# Patient Record
Sex: Female | Born: 1970
Health system: Southern US, Community
[De-identification: ages and names within clinical notes are randomized; demographics above are authoritative.]

## PROBLEM LIST (undated history)

## (undated) DIAGNOSIS — J40 Bronchitis, not specified as acute or chronic: Secondary | ICD-10-CM

## (undated) DIAGNOSIS — N920 Excessive and frequent menstruation with regular cycle: Secondary | ICD-10-CM

## (undated) HISTORY — PX: TUBAL LIGATION: SHX77

## (undated) HISTORY — DX: Excessive and frequent menstruation with regular cycle: N92.0

---

## 2003-04-30 ENCOUNTER — Encounter: Admission: RE | Admit: 2003-04-30 | Discharge: 2003-04-30 | Payer: Self-pay | Admitting: Family Medicine

## 2003-04-30 ENCOUNTER — Other Ambulatory Visit: Admission: RE | Admit: 2003-04-30 | Discharge: 2003-04-30 | Payer: Self-pay | Admitting: Family Medicine

## 2003-05-02 ENCOUNTER — Encounter: Admission: RE | Admit: 2003-05-02 | Discharge: 2003-05-02 | Payer: Self-pay | Admitting: Family Medicine

## 2003-07-06 ENCOUNTER — Encounter: Admission: RE | Admit: 2003-07-06 | Discharge: 2003-07-06 | Payer: Self-pay | Admitting: Sports Medicine

## 2004-02-26 ENCOUNTER — Ambulatory Visit: Payer: Self-pay | Admitting: Family Medicine

## 2004-02-28 ENCOUNTER — Ambulatory Visit: Payer: Self-pay | Admitting: Family Medicine

## 2004-07-18 ENCOUNTER — Ambulatory Visit: Payer: Self-pay | Admitting: Family Medicine

## 2004-07-30 ENCOUNTER — Ambulatory Visit: Payer: Self-pay | Admitting: Family Medicine

## 2005-01-07 ENCOUNTER — Ambulatory Visit: Payer: Self-pay | Admitting: Family Medicine

## 2005-01-13 ENCOUNTER — Encounter: Admission: RE | Admit: 2005-01-13 | Discharge: 2005-01-13 | Payer: Self-pay | Admitting: Sports Medicine

## 2005-02-10 ENCOUNTER — Ambulatory Visit: Payer: Self-pay | Admitting: Family Medicine

## 2005-03-10 ENCOUNTER — Ambulatory Visit: Payer: Self-pay | Admitting: Family Medicine

## 2005-09-25 ENCOUNTER — Other Ambulatory Visit: Admission: RE | Admit: 2005-09-25 | Discharge: 2005-09-25 | Payer: Self-pay | Admitting: Family Medicine

## 2005-09-25 ENCOUNTER — Ambulatory Visit: Payer: Self-pay | Admitting: Family Medicine

## 2005-09-26 ENCOUNTER — Encounter (INDEPENDENT_AMBULATORY_CARE_PROVIDER_SITE_OTHER): Payer: Self-pay | Admitting: *Deleted

## 2005-09-26 LAB — CONVERTED CEMR LAB

## 2005-09-28 ENCOUNTER — Ambulatory Visit: Payer: Self-pay | Admitting: Family Medicine

## 2006-05-20 DIAGNOSIS — G43909 Migraine, unspecified, not intractable, without status migrainosus: Secondary | ICD-10-CM | POA: Insufficient documentation

## 2006-05-21 ENCOUNTER — Encounter (INDEPENDENT_AMBULATORY_CARE_PROVIDER_SITE_OTHER): Payer: Self-pay | Admitting: *Deleted

## 2007-01-20 ENCOUNTER — Encounter (INDEPENDENT_AMBULATORY_CARE_PROVIDER_SITE_OTHER): Payer: Self-pay | Admitting: Family Medicine

## 2007-01-20 LAB — CONVERTED CEMR LAB

## 2007-01-24 ENCOUNTER — Other Ambulatory Visit: Admission: RE | Admit: 2007-01-24 | Discharge: 2007-01-24 | Payer: Self-pay | Admitting: Family Medicine

## 2007-01-24 ENCOUNTER — Ambulatory Visit: Payer: Self-pay | Admitting: Family Medicine

## 2007-01-24 ENCOUNTER — Encounter (INDEPENDENT_AMBULATORY_CARE_PROVIDER_SITE_OTHER): Payer: Self-pay | Admitting: Family Medicine

## 2007-01-24 DIAGNOSIS — D649 Anemia, unspecified: Secondary | ICD-10-CM | POA: Insufficient documentation

## 2007-01-27 ENCOUNTER — Encounter (INDEPENDENT_AMBULATORY_CARE_PROVIDER_SITE_OTHER): Payer: Self-pay | Admitting: Family Medicine

## 2007-01-27 ENCOUNTER — Ambulatory Visit: Payer: Self-pay | Admitting: Family Medicine

## 2007-01-27 LAB — CONVERTED CEMR LAB
BUN: 16 mg/dL
CO2: 24 meq/L
Calcium: 8.8 mg/dL
Chloride: 106 meq/L
Cholesterol: 171 mg/dL
Creatinine, Ser: 0.85 mg/dL
Glucose, Bld: 95 mg/dL
HCT: 35.1 % — ABNORMAL LOW
HDL: 54 mg/dL
Hemoglobin: 11.1 g/dL — ABNORMAL LOW
LDL Cholesterol: 103 mg/dL — ABNORMAL HIGH
MCHC: 31.6 g/dL
MCV: 96.4 fL
Platelets: 333 K/uL
Potassium: 4.6 meq/L
RBC: 3.64 M/uL — ABNORMAL LOW
RDW: 15.4 % — ABNORMAL HIGH
Sodium: 138 meq/L
Total CHOL/HDL Ratio: 3.2
Triglycerides: 69 mg/dL
VLDL: 14 mg/dL
WBC: 4.7 10*3/microliter

## 2007-01-30 ENCOUNTER — Encounter (INDEPENDENT_AMBULATORY_CARE_PROVIDER_SITE_OTHER): Payer: Self-pay | Admitting: Family Medicine

## 2007-03-01 ENCOUNTER — Ambulatory Visit: Payer: Self-pay | Admitting: Family Medicine

## 2007-03-01 ENCOUNTER — Encounter: Payer: Self-pay | Admitting: Family Medicine

## 2007-03-01 ENCOUNTER — Other Ambulatory Visit: Admission: RE | Admit: 2007-03-01 | Discharge: 2007-03-01 | Payer: Self-pay | Admitting: Family Medicine

## 2007-03-01 DIAGNOSIS — R8789 Other abnormal findings in specimens from female genital organs: Secondary | ICD-10-CM | POA: Insufficient documentation

## 2007-03-08 ENCOUNTER — Encounter: Payer: Self-pay | Admitting: Family Medicine

## 2007-11-12 ENCOUNTER — Emergency Department (HOSPITAL_COMMUNITY): Admission: EM | Admit: 2007-11-12 | Discharge: 2007-11-12 | Payer: Self-pay | Admitting: Emergency Medicine

## 2007-11-12 ENCOUNTER — Encounter (INDEPENDENT_AMBULATORY_CARE_PROVIDER_SITE_OTHER): Payer: Self-pay | Admitting: Internal Medicine

## 2010-03-04 ENCOUNTER — Ambulatory Visit: Payer: Self-pay | Admitting: Family Medicine

## 2010-03-13 ENCOUNTER — Other Ambulatory Visit
Admission: RE | Admit: 2010-03-13 | Discharge: 2010-03-13 | Payer: Self-pay | Source: Home / Self Care | Admitting: Family Medicine

## 2010-03-13 ENCOUNTER — Encounter: Payer: Self-pay | Admitting: Family Medicine

## 2010-03-13 DIAGNOSIS — A5901 Trichomonal vulvovaginitis: Secondary | ICD-10-CM | POA: Insufficient documentation

## 2010-03-13 DIAGNOSIS — N760 Acute vaginitis: Secondary | ICD-10-CM | POA: Insufficient documentation

## 2010-03-13 LAB — CONVERTED CEMR LAB
ALT: 9 units/L (ref 0–35)
AST: 13 units/L (ref 0–37)
Albumin: 3.6 g/dL (ref 3.5–5.2)
Alkaline Phosphatase: 87 units/L (ref 39–117)
BUN: 9 mg/dL (ref 6–23)
CO2: 26 meq/L (ref 19–32)
Calcium: 8.8 mg/dL (ref 8.4–10.5)
Chlamydia, DNA Probe: NEGATIVE
Chloride: 103 meq/L (ref 96–112)
Creatinine, Ser: 0.81 mg/dL (ref 0.40–1.20)
Direct LDL: 100 mg/dL — ABNORMAL HIGH
GC Probe Amp, Genital: NEGATIVE
Glucose, Bld: 86 mg/dL (ref 70–99)
HCT: 28.4 % — ABNORMAL LOW (ref 36.0–46.0)
Hemoglobin: 8.6 g/dL — ABNORMAL LOW (ref 12.0–15.0)
MCHC: 30.3 g/dL (ref 30.0–36.0)
MCV: 86.3 fL (ref 78.0–100.0)
Pap Smear: NEGATIVE
Platelets: 410 10*3/uL — ABNORMAL HIGH (ref 150–400)
Potassium: 3.9 meq/L (ref 3.5–5.3)
RBC: 3.29 M/uL — ABNORMAL LOW (ref 3.87–5.11)
RDW: 17.1 % — ABNORMAL HIGH (ref 11.5–15.5)
Sodium: 138 meq/L (ref 135–145)
Total Bilirubin: 0.1 mg/dL — ABNORMAL LOW (ref 0.3–1.2)
Total Protein: 7.4 g/dL (ref 6.0–8.3)
WBC: 5.7 10*3/uL (ref 4.0–10.5)
Whiff Test: POSITIVE

## 2010-03-14 ENCOUNTER — Encounter: Payer: Self-pay | Admitting: Family Medicine

## 2010-03-18 ENCOUNTER — Ambulatory Visit: Payer: Self-pay

## 2010-04-24 NOTE — Assessment & Plan Note (Signed)
Summary: physical/bmc   Vital Signs:  Patient profile:   40 year old female Height:      64.75 inches Weight:      249 pounds Temp:     99.1 degrees F oral Pulse rate:   62 / minute BP sitting:   126 / 76  (left arm) Cuff size:   large  Vitals Entered By: Loralee Pacas CMA (March 13, 2010 4:15 PM) CC: cpp   Primary Care Provider:  . [facility]  CC:  cpp.  History of Present Illness: 40 yo female here to get physical. Pt is doing relatively well no true concerns except she has hx of anemia and would like it checked again. Pt states at times she seems to get tired with a lot of activity but still can climb 3 flights of stairs without much trouble.  Pt though denies any fever, chills, nausea, vomiting, diarrhea or constipation.  Pt denies any shortness of breath cheat pain or weight changes recently as well.   Pt feels safe at home is taking care of her kids.    Pt is still obese and has not been trying very hard to be on a exercise regimen and at present moment a little too stress to do it.    Pt also states that she has a hard time staying a sleep waking up usually around 3 am and then cannot stay asleep.  Pt states she does not have a sleep regimen or routine and will go to be sometime around 9pm and 1am.  Pt states she may eat 3 hours before she goes to bed or 30 minutes before.  Pt does snore but does not feel her snoring is what wakes her up.    Preventitive:  Pt has never had a mamogram done  Current Medications (verified): 1)  None  Allergies (verified): No Known Drug Allergies  Past History:  Past medical, surgical, family and social histories (including risk factors) reviewed, and no changes noted (except as noted below).  Past Medical History: Reviewed history from 05/20/2006 and no changes required. BTL 1995, C-section-1990, facial dysesthesia--normal MRI 10/06  782.0, H/O Chlamydia 2/05-treated, VBAC x 3  Past Surgical History: Reviewed history from  05/20/2006 and no changes required. MRI brain-Normal, no evidence of MS - 01/21/2005  Family History: Reviewed history from 01/24/2007 and no changes required. Father died at 38 with CHF, maternal aunt -- diabetes, Mother died at 65 with resp. failure, bronchitis, Three sisters-4,41,43; 34 yo with fibromyalgia No FH breast CA  Social History: Reviewed history from 01/24/2007 and no changes required. Unemployed but earned associates degree 5/07 in early childhood development.  Has 4 children ('90, '91, '93, '95).  Lives in an apartment.  No tobacco.  Drinks socially, only one alcoholic drink or so per week.  Pt has monogamous BF since 1/07, not using condoms  Review of Systems       see hpi  Physical Exam  General:  Well-developed,well-nourished,in no acute distress; alert,appropriate and cooperative throughout examination. overweight-appearing.  pleasant female Eyes:  vision grossly intact, pupils equal, pupils round, and pupils reactive to light.   Mouth:  Oral mucosa and oropharynx without lesions or exudates.  Teeth in good repair. Neck:  no lad Lungs:  Normal respiratory effort, chest expands symmetrically. Lungs are clear to auscultation, no crackles or wheezes. Heart:  Normal rate and regular rhythm. S1 and S2 normal without gallop, murmur, click, rub or other extra sounds. Abdomen:  Bowel sounds positive,abdomen soft  and non-tender without masses, organomegaly or hernias noted. obese Genitalia:  Pelvic Exam:        External: normal female genitalia without lesions or masses        Vagina: normal without lesions or masses        Cervix: normal without lesions or masses        Adnexa: normal bimanual exam without masses or fullness        Uterus: normal by palpation        Pap smear: performed Pulses:  2+ Extremities:  No clubbing, cyanosis, edema, or deformity noted with normal full range of motion of all joints.   Neurologic:  CN 2-12 intact   Impression &  Recommendations:  Problem # 1:  OBESITY, UNSPECIFIED (ICD-278.00) will get labs and see if we can motivate her in other ways to lose weight. Orders: Comp Met-[facility] [phone]) Direct LDL-[facility] [phone]) [facility] - Est  40-64 yrs (24401)  Problem # 2:  PHYSICAL EXAMINATION (ICD-V70.0) mamogram ordered today Orders: [facility] - Est  40-64 yrs (02725) Mammogram (Screening) (Mammo)  Problem # 3:  TRICHOMONAL VULVOVAGINITIS (ICD-131.01) will call pt and tell her at later date that she should take a medicine for this and that it is sexually transmitted.  Attempted to call pt but no answer and did not feel it was proper to leave a message.   Other Orders: GC/Chlamydia-[facility] (87591/87491) [facility] [phone]) Pap Smear-[facility] ([id number]) CBC-[facility] [phone])  Patient Instructions: 1)  Good to meet you 2)  I will call you with your labs results 3)  I want to see you again in 6 weeks if you are having sleeping problems still   Orders Added: 1)  GC/Chlamydia-[facility] [87591/87491] 2)  Wet Prep- [facility] [87210] 3)  Pap Smear-[facility] [[id number]] 4)  Comp Met-[facility] [[id number]] 5)  Direct LDL-[facility] [[id number]] 6)  CBC-[facility] [85027] 7)  [facility] - Est  40-64 yrs [99396] 8)  Mammogram (Screening) [Mammo]    Laboratory Results  Date/Time Received: March 13, 2010 4:19 PM  Date/Time Reported: March 13, 2010 5:11 PM   [employer] Source: vag WBC/hpf: >20 Bacteria/hpf: 3+ cocci and few rods Clue cells/hpf: many  Positive whiff Yeast/hpf: none Trichomonas/hpf: none Comments: ...............test performed by......Marland KitchenBonnie A. [place], MLS (ASCP)cm

## 2010-04-24 NOTE — Miscellaneous (Signed)
  Clinical Lists Changes  Medications: Added new medication of METRONIDAZOLE 500 MG TABS (METRONIDAZOLE) 1 tab by mouth two times a day for the next 5 days - Signed Rx of METRONIDAZOLE 500 MG TABS (METRONIDAZOLE) 1 tab by mouth two times a day for the next 5 days;  #10 x 0;  Signed;  Entered by: Antoine Primas DO;  Authorized by: Antoine Primas DO;  Method used: Electronically to [facility], [place], [place]  [id number], Ph: [phone], Fax: [phone]    Prescriptions: METRONIDAZOLE 500 MG TABS (METRONIDAZOLE) 1 tab by mouth two times a day for the next 5 days  #10 x 0   Entered and Authorized by:   Antoine Primas DO   Signed by:   Antoine Primas DO on 03/14/2010   Method used:   Electronically to        [facility] (retail)       [address]       [place], [place]  [id number]       Ph: [phone]       Fax: [phone]   RxID:   [id number]   Appended Document:  called pt told her about in fection she thanked me.

## 2010-04-30 ENCOUNTER — Emergency Department (HOSPITAL_COMMUNITY)
Admission: EM | Admit: 2010-04-30 | Discharge: 2010-04-30 | Disposition: A | Discharge status: Home / Self Care | Payer: Medicaid Other | Attending: Emergency Medicine | Admitting: Emergency Medicine

## 2010-04-30 DIAGNOSIS — R51 Headache: Secondary | ICD-10-CM | POA: Insufficient documentation

## 2010-04-30 LAB — URINALYSIS, ROUTINE W REFLEX MICROSCOPIC
Bilirubin Urine: NEGATIVE
Ketones, ur: 15 mg/dL — AB
Nitrite: NEGATIVE
Protein, ur: NEGATIVE mg/dL
Specific Gravity, Urine: 1.028 (ref 1.005–1.030)
Urine Glucose, Fasting: NEGATIVE mg/dL
Urobilinogen, UA: 1 mg/dL (ref 0.0–1.0)
pH: 6 (ref 5.0–8.0)

## 2010-04-30 LAB — URINE MICROSCOPIC-ADD ON

## 2010-04-30 LAB — POCT PREGNANCY, URINE: Preg Test, Ur: NEGATIVE

## 2010-05-21 ENCOUNTER — Inpatient Hospital Stay (INDEPENDENT_AMBULATORY_CARE_PROVIDER_SITE_OTHER)
Admission: RE | Admit: 2010-05-21 | Discharge: 2010-05-21 | Disposition: A | Discharge status: Home / Self Care | Payer: Medicaid Other | Source: Ambulatory Visit | Attending: Emergency Medicine | Admitting: Emergency Medicine

## 2010-05-21 DIAGNOSIS — L0231 Cutaneous abscess of buttock: Secondary | ICD-10-CM

## 2010-05-21 DIAGNOSIS — N760 Acute vaginitis: Secondary | ICD-10-CM

## 2010-05-21 DIAGNOSIS — L03317 Cellulitis of buttock: Secondary | ICD-10-CM

## 2010-05-21 LAB — WET PREP, GENITAL
Clue Cells Wet Prep HPF POC: NONE SEEN
Trich, Wet Prep: NONE SEEN
Yeast Wet Prep HPF POC: NONE SEEN

## 2010-05-22 LAB — GC/CHLAMYDIA PROBE AMP, GENITAL
Chlamydia, DNA Probe: NEGATIVE
GC Probe Amp, Genital: NEGATIVE

## 2010-06-02 ENCOUNTER — Ambulatory Visit (INDEPENDENT_AMBULATORY_CARE_PROVIDER_SITE_OTHER): Payer: Medicaid Other | Admitting: Family Medicine

## 2010-06-02 ENCOUNTER — Encounter: Payer: Self-pay | Admitting: Family Medicine

## 2010-06-02 VITALS — BP 103/69 | HR 83 | Temp 98.1°F | Ht 64.0 in | Wt 249.0 lb

## 2010-06-02 DIAGNOSIS — Z0000 Encounter for general adult medical examination without abnormal findings: Secondary | ICD-10-CM

## 2010-06-02 LAB — POCT URINALYSIS DIPSTICK
Bilirubin, UA: NEGATIVE
Glucose, UA: NEGATIVE
Ketones, UA: NEGATIVE
Nitrite, UA: NEGATIVE
Protein, UA: NEGATIVE
Spec Grav, UA: >=1.03
Urobilinogen, UA: 0.2
pH, UA: 6

## 2010-06-02 NOTE — Assessment & Plan Note (Signed)
No medical problems except for obesity.  Normal ROS.  Neg protein in urine.  Normal exam.  Certificate given for 2 years.

## 2010-06-02 NOTE — Progress Notes (Signed)
  Subjective:    Patient ID: Joanne Hampton, female    DOB: 07-Aug-1970, 40 y.o.   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  HENT: Negative for ear pain, congestion and neck pain.   Eyes: Negative for pain and visual disturbance.  Respiratory: Negative for chest tightness, shortness of breath and wheezing.   Cardiovascular: Negative for chest pain and leg swelling.  Gastrointestinal: Negative for abdominal distention.  Genitourinary: Negative for difficulty urinating and menstrual problem.  Musculoskeletal: Negative for back pain, joint swelling, arthralgias and gait problem.  Neurological: Negative for dizziness, tremors, seizures, speech difficulty, weakness, light-headedness, numbness and headaches.  Psychiatric/Behavioral: Negative for confusion, decreased concentration and agitation.       Objective:   Physical Exam  Constitutional: She is oriented to person, place, and time. No distress.       Obese  HENT:  Head: Normocephalic and atraumatic.  Right Ear: External ear normal.  Left Ear: External ear normal.  Mouth/Throat: No oropharyngeal exudate.  Eyes: Conjunctivae and EOM are normal. Pupils are equal, round, and reactive to light. No scleral icterus.  Neck: Normal range of motion. Neck supple. No JVD present. No thyromegaly present.  Cardiovascular: Normal rate, regular rhythm and normal heart sounds.   No murmur heard. Pulmonary/Chest: Effort normal and breath sounds normal. No respiratory distress. She has no wheezes. She has no rales. She exhibits no tenderness.  Abdominal: Soft. Bowel sounds are normal. She exhibits no distension. There is no tenderness. There is no rebound and no guarding.  Musculoskeletal:  Normal range of motion. She exhibits no edema and no tenderness.       Normal strength in all extremities  Neurological: She is alert and oriented to person, place, and time. No cranial nerve deficit. She exhibits normal muscle tone. Coordination normal.  Skin: Skin is warm and dry. No rash noted. She is not diaphoretic.  Psychiatric: She has a normal mood and affect. Her behavior is normal. Judgment and thought content normal.          Assessment & Plan:

## 2010-06-04 LAB — URINE CULTURE
Colony Count: NO GROWTH
Organism ID, Bacteria: NO GROWTH

## 2010-06-16 ENCOUNTER — Inpatient Hospital Stay (INDEPENDENT_AMBULATORY_CARE_PROVIDER_SITE_OTHER)
Admission: RE | Admit: 2010-06-16 | Discharge: 2010-06-16 | Disposition: A | Discharge status: Home / Self Care | Payer: Medicaid Other | Source: Ambulatory Visit | Attending: Family Medicine | Admitting: Family Medicine

## 2010-06-16 DIAGNOSIS — N760 Acute vaginitis: Secondary | ICD-10-CM

## 2010-06-16 LAB — POCT URINALYSIS DIP (DEVICE)
Bilirubin Urine: NEGATIVE
Glucose, UA: NEGATIVE mg/dL
Hgb urine dipstick: NEGATIVE
Ketones, ur: NEGATIVE mg/dL
Nitrite: NEGATIVE
Protein, ur: NEGATIVE mg/dL
Specific Gravity, Urine: 1.02 (ref 1.005–1.030)
Urobilinogen, UA: 0.2 mg/dL (ref 0.0–1.0)
pH: 7.5 (ref 5.0–8.0)

## 2010-06-16 LAB — WET PREP, GENITAL
Clue Cells Wet Prep HPF POC: NONE SEEN
Trich, Wet Prep: NONE SEEN
Yeast Wet Prep HPF POC: NONE SEEN

## 2010-06-17 LAB — GC/CHLAMYDIA PROBE AMP, GENITAL
Chlamydia, DNA Probe: NEGATIVE
GC Probe Amp, Genital: NEGATIVE

## 2011-01-21 ENCOUNTER — Other Ambulatory Visit (HOSPITAL_COMMUNITY)
Admission: RE | Admit: 2011-01-21 | Discharge: 2011-01-21 | Disposition: A | Discharge status: Home / Self Care | Payer: Medicaid Other | Source: Ambulatory Visit | Attending: Family Medicine | Admitting: Family Medicine

## 2011-01-21 ENCOUNTER — Ambulatory Visit (INDEPENDENT_AMBULATORY_CARE_PROVIDER_SITE_OTHER): Payer: Medicaid Other | Admitting: Family Medicine

## 2011-01-21 ENCOUNTER — Encounter: Payer: Self-pay | Admitting: Family Medicine

## 2011-01-21 DIAGNOSIS — B9689 Other specified bacterial agents as the cause of diseases classified elsewhere: Secondary | ICD-10-CM

## 2011-01-21 DIAGNOSIS — Z124 Encounter for screening for malignant neoplasm of cervix: Secondary | ICD-10-CM

## 2011-01-21 DIAGNOSIS — Z01419 Encounter for gynecological examination (general) (routine) without abnormal findings: Secondary | ICD-10-CM | POA: Insufficient documentation

## 2011-01-21 DIAGNOSIS — N898 Other specified noninflammatory disorders of vagina: Secondary | ICD-10-CM | POA: Insufficient documentation

## 2011-01-21 DIAGNOSIS — N760 Acute vaginitis: Secondary | ICD-10-CM

## 2011-01-21 DIAGNOSIS — A499 Bacterial infection, unspecified: Secondary | ICD-10-CM

## 2011-01-21 LAB — POCT WET PREP (WET MOUNT)
Trichomonas Wet Prep HPF POC: NEGATIVE
WBC, Wet Prep HPF POC: <5
Yeast Wet Prep HPF POC: NEGATIVE

## 2011-01-21 MED ORDER — METRONIDAZOLE 500 MG PO TABS: 500.0000 mg | ORAL_TABLET | Freq: Two times a day (BID) | ORAL | Status: AC

## 2011-01-21 NOTE — Progress Notes (Signed)
  Subjective:    Patient ID: Joanne Hampton, female    DOB: Apr 09, 1970, 40 y.o.   MRN: [id number]  HPI Patient is having some vaginal irritation. Patient has had this for 45 days. Patient does have a new boyfriend but has been using condoms. Patient does have a history of gonorrhea and Chlamydia in the past. Patient would like to be checked it. When reviewing the chart patient is due for Pap smear Patient denies any fevers chills any abnormal bleeding any abdominal pain out of the ordinary.  Review of Systems As stated in history of present illness     Objective:   Physical Exam General: No apparent distress obese Cardiovascular: Regular in rhythm no murmur Pulmonary: Clear to auscultation bilaterally Abdominal exam: Bowel sounds positive in all 4 quadrants nontender nondistended no masses appreciated no organomegaly Pelvic exam: Vaginal tissues appears to be healthy and pink patient does have scant watery discharge cervix appears normal no bleeding coming for a loss no concerning areas.    Assessment & Plan:

## 2011-01-21 NOTE — Assessment & Plan Note (Signed)
Per her wet prep. We will do Flagyl for the next 5 days if becomes a recurrent issue we'll consider more chronic therapy.

## 2011-01-22 LAB — GC/CHLAMYDIA PROBE AMP, GENITAL
Chlamydia, DNA Probe: NEGATIVE
GC Probe Amp, Genital: NEGATIVE

## 2011-01-26 ENCOUNTER — Telehealth: Payer: Self-pay | Admitting: Family Medicine

## 2011-01-26 NOTE — Telephone Encounter (Signed)
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

## 2011-01-26 NOTE — Telephone Encounter (Signed)
Will see if Dr. Katrinka Blazing is willing to complete the medical certificate card since Dr. Lelon Perla is no long available to do this.  Ileana Ladd

## 2011-01-27 NOTE — Telephone Encounter (Signed)
Message left on voicemail that form is ready to pick up. 

## 2011-01-27 NOTE — Telephone Encounter (Signed)
Filled out return to [employer]

## 2011-09-04 ENCOUNTER — Encounter: Payer: Self-pay | Admitting: Family Medicine

## 2011-09-04 ENCOUNTER — Ambulatory Visit (INDEPENDENT_AMBULATORY_CARE_PROVIDER_SITE_OTHER): Payer: Medicaid Other | Admitting: Family Medicine

## 2011-09-04 VITALS — BP 135/81 | HR 85 | Temp 99.1°F | Ht 62.0 in | Wt 242.3 lb

## 2011-09-04 DIAGNOSIS — J309 Allergic rhinitis, unspecified: Secondary | ICD-10-CM

## 2011-09-04 DIAGNOSIS — J40 Bronchitis, not specified as acute or chronic: Secondary | ICD-10-CM

## 2011-09-04 DIAGNOSIS — J069 Acute upper respiratory infection, unspecified: Secondary | ICD-10-CM | POA: Insufficient documentation

## 2011-09-04 DIAGNOSIS — J302 Other seasonal allergic rhinitis: Secondary | ICD-10-CM

## 2011-09-04 MED ORDER — ALBUTEROL SULFATE (2.5 MG/3ML) 0.083% IN NEBU
2.5000 mg | INHALATION_SOLUTION | Freq: Once | RESPIRATORY_TRACT | Status: AC
  Administered 2011-09-04: 2.5 mg via RESPIRATORY_TRACT

## 2011-09-04 MED ORDER — ALBUTEROL SULFATE HFA 108 (90 BASE) MCG/ACT IN AERS: 2.0000 | INHALATION_SPRAY | Freq: Four times a day (QID) | RESPIRATORY_TRACT | Status: DC | PRN

## 2011-09-04 MED ORDER — FLUTICASONE PROPIONATE 50 MCG/ACT NA SUSP: 2.0000 | Freq: Every day | NASAL | Status: DC

## 2011-09-04 MED ORDER — DEXAMETHASONE SODIUM PHOSPHATE 10 MG/ML IJ SOLN
10.0000 mg | Freq: Once | INTRAMUSCULAR | Status: AC
  Administered 2011-09-04: 10 mg via INTRAMUSCULAR

## 2011-09-04 MED ORDER — IPRATROPIUM BROMIDE 0.02 % IN SOLN
0.5000 mg | Freq: Once | RESPIRATORY_TRACT | Status: AC
  Administered 2011-09-04: 0.5 mg via RESPIRATORY_TRACT

## 2011-09-04 MED ORDER — CETIRIZINE HCL 10 MG PO TABS: 10.0000 mg | ORAL_TABLET | Freq: Every day | ORAL | Status: DC

## 2011-09-04 NOTE — Assessment & Plan Note (Signed)
Patient's start Flonase potentially the postnasal drip is causing worsening of her cough as well. Zyrtec daily for 2 weeks then as needed. Followup as needed.

## 2011-09-04 NOTE — Patient Instructions (Signed)
I sent in 3 medications for you. Think this is your allergies but I think you do have a little bronchitis so we did give you a steroid today 2. You can use the inhaler up to every 4 hours. If you feel like he needed more than that he need to be seen again. Used to no spray in each nostril daily to help with your allergies. If you're not getting better in the next 2-3 weeks please come back for reevaluation. You are due to be seen again for your physical in October.

## 2011-09-04 NOTE — Progress Notes (Signed)
Patient ID: Edythe Riches, female   DOB: 09-02-1970, 41 y.o.   MRN: [id number]  SUBJECTIVE:  Labria Wos is a 41 y.o. female who complains of congestion, sore throat, post nasal drip and dry cough for 14 days. She denies a history of chest pain, chills, fevers, sweats, vomiting and weight loss. She denies a history of asthma. Patient denies smoke cigarettes.   OBJECTIVE: Vitals as noted above. Appearance: alert, well appearing, and in no distress, overweight and well hydrated.  ENT- bilateral TM fluid noted and post nasal drip noted.  Chest - wheezing noted throughout.  ASSESSMENT:  bronchitis  PLAN: Symptomatic therapy suggested: push fluids, rest, gargle warm salt water and return office visit prn if symptoms persist or worsen. Call or return to clinic prn if these symptoms worsen or fail to improve as anticipated.  Bronchitis Patient has symptoms of likely a viral bronchitis. Has lasted 2 weeks and this might be a post viral induced cough. Given an albuterol treatment which did improve symptoms. Patient given inhaler at home given a shot of Decadron today. Flonase for postnasal drip followup as needed. Patient is a red flags and when to seek medical attention.  Allergic rhinitis, seasonal Patient's start Flonase potentially the postnasal drip is causing worsening of her cough as well. Zyrtec daily for 2 weeks then as needed. Followup as needed.

## 2011-09-04 NOTE — Assessment & Plan Note (Signed)
Patient has symptoms of likely a viral bronchitis. Has lasted 2 weeks and this might be a post viral induced cough. Given an albuterol treatment which did improve symptoms. Patient given inhaler at home given a shot of Decadron today. Flonase for postnasal drip followup as needed. Patient is a red flags and when to seek medical attention.

## 2011-09-09 ENCOUNTER — Emergency Department (HOSPITAL_COMMUNITY)
Admission: EM | Admit: 2011-09-09 | Discharge: 2011-09-10 | Disposition: A | Discharge status: Home / Self Care | Payer: Medicaid Other | Attending: Emergency Medicine | Admitting: Emergency Medicine

## 2011-09-09 ENCOUNTER — Encounter (HOSPITAL_COMMUNITY): Payer: Self-pay | Admitting: Emergency Medicine

## 2011-09-09 DIAGNOSIS — H6121 Impacted cerumen, right ear: Secondary | ICD-10-CM

## 2011-09-09 DIAGNOSIS — H6691 Otitis media, unspecified, right ear: Secondary | ICD-10-CM

## 2011-09-09 DIAGNOSIS — H9201 Otalgia, right ear: Secondary | ICD-10-CM

## 2011-09-09 DIAGNOSIS — H6120 Impacted cerumen, unspecified ear: Secondary | ICD-10-CM | POA: Insufficient documentation

## 2011-09-09 DIAGNOSIS — H6690 Otitis media, unspecified, unspecified ear: Secondary | ICD-10-CM | POA: Insufficient documentation

## 2011-09-09 HISTORY — DX: Bronchitis, not specified as acute or chronic: J40

## 2011-09-09 MED ORDER — DOCUSATE SODIUM 50 MG/5ML PO LIQD
200.0000 mg | Freq: Once | ORAL | Status: AC
  Administered 2011-09-10: 200 mg via ORAL
  Filled 2011-09-09: qty 10

## 2011-09-09 NOTE — ED Notes (Signed)
Pt states she was tx with prednisone, breathing tx, inhalers and nasal spray on Friday for bronchitis and the pt states she IS feeling better after taking these medications.  However, today she began feeling like her ear was "stopped up".  As the day progressed, her R ear pain became unbearable to the point where she is tearful and can barely walk d/t the pain.

## 2011-09-09 NOTE — ED Provider Notes (Signed)
History     CSN: [id number]  Arrival date & time 09/09/11  2108   First MD Initiated Contact with Patient 09/09/11 2304      Chief Complaint  Patient presents with  . Otalgia    (Consider location/radiation/quality/duration/timing/severity/associated sxs/prior treatment) Patient is a 41 y.o. female presenting with ear pain. The history is provided by the patient.  Otalgia  She noticed onset this afternoon of fullness in her right ear and that sounds are muffled out of that ear. As the day has gone on, she started having pain in the preauricular area radiating up towards the her temple and down into her neck. Pain is moderate and she rates it 7/10. She denies fever, chills, sweats. Pain is dull. Nothing makes it better nothing makes it worse. She denies trying to clean her ears are scratched inside her ears.  Past Medical History  Diagnosis Date  . Bronchitis     History reviewed. No pertinent past surgical history.  No family history on file.  History  Substance Use Topics  . Smoking status: Never Smoker   . Smokeless tobacco: Not on file  . Alcohol Use: Yes    OB History    Grav Para Term Preterm Abortions TAB SAB Ect Mult Living                  Review of Systems  HENT: Positive for ear pain.   All other systems reviewed and are negative.    Allergies  Review of patient's allergies indicates no known allergies.  Home Medications   Current Outpatient Rx  Name Route Sig Dispense Refill  . ALBUTEROL SULFATE HFA 108 (90 BASE) MCG/ACT IN AERS Inhalation Inhale 2 puffs into the lungs every 6 (six) hours as needed for wheezing. 1 Inhaler 0  . CETIRIZINE HCL 10 MG PO TABS Oral Take 1 tablet (10 mg total) by mouth daily. 30 tablet 11  . FLUTICASONE PROPIONATE 50 MCG/ACT NA SUSP Nasal Place 2 sprays into the nose daily. 16 g 6    BP 169/98  Pulse 82  Temp 99.3 F (37.4 C) (Oral)  Resp 20  SpO2 97%  LMP 08/20/2011  Physical Exam  Nursing note and vitals  reviewed.  41 year old female who is resting comfortably and in no acute distress. Vital signs are significant for hypertension with blood pressure 169/98. Oxygen saturation is 97% which is normal. Head is normocephalic and atraumatic. PERRLA, EOMI. Left tympanic membrane is clear. Right tympanic remain is obscured by cerumen. There is no pericardial lymph node palpable. Oropharynx is clear. Neck is nontender and supple without adenopathy. Lungs are clear without rales, wheezes, rhonchi. Heart has regular rate and rhythm without murmur. Abdomen is soft, flat, nontender without masses or hepatosplenomegaly. Extremities have full range of motion, no cyanosis or edema. Skin is warm and dry without rash. Neurologic: Mental status is normal, cranial nerves are intact, there are no motor or sensory deficits.   ED Course  Procedures (including critical care time)    1. Otalgia of right ear   2. Impacted cerumen of right ear   3. Otitis media of right ear       MDM  Cerumen impaction of the right ear.-year-old be irrigated and she will be reevaluated.  After cerumen removal, he felt a little better but pain had not changed and she still has slight decreased hearing. On reexamination of the external auditory canal, moderate erythema is seen of the tympanic membrane. She'll be treated for otitis  media and given a prescription for amoxicillin and a prescription for Percocet for pain. She is to followup with her PCP.     Dione Booze, MD 09/10/11 [phone]

## 2011-09-09 NOTE — ED Notes (Signed)
PT. REPORTS RIGHT EAR ACHE " CLOGGED UP" WITH NO DRAINAGE ONSET THIS AFTERNOON , DENIES INJURY.

## 2011-09-10 MED ORDER — OXYCODONE-ACETAMINOPHEN 5-325 MG PO TABS
1.0000 | ORAL_TABLET | Freq: Once | ORAL | Status: AC
  Administered 2011-09-10: 1 via ORAL
  Filled 2011-09-10: qty 1

## 2011-09-10 MED ORDER — OXYCODONE-ACETAMINOPHEN 5-325 MG PO TABS: 1.0000 | ORAL_TABLET | ORAL | Status: AC | PRN

## 2011-09-10 MED ORDER — AMOXICILLIN 500 MG PO CAPS: 1000.0000 mg | ORAL_CAPSULE | Freq: Two times a day (BID) | ORAL | Status: AC

## 2011-09-10 MED ORDER — AMOXICILLIN 500 MG PO CAPS
1000.0000 mg | ORAL_CAPSULE | Freq: Once | ORAL | Status: AC
Start: 2011-09-10 — End: 2011-09-10
  Administered 2011-09-10: 1000 mg via ORAL
  Filled 2011-09-10: qty 2

## 2011-09-10 NOTE — ED Notes (Signed)
Pt. D/c home. Family member will drive. A.O. X 4. NAD. Ambulatory.

## 2011-09-10 NOTE — ED Notes (Signed)
Pt.'s right and left ear's irrigated. Tolerated well. Impaction in right ear removed with Colace and warm water/hydrogen peroxide mixture.  Outer wax removed from Left ear.

## 2011-09-10 NOTE — Discharge Instructions (Signed)
Otitis Media, Adult A middle ear infection is an infection in the space behind the eardrum. The medical name for this is "otitis media." It may happen after a common cold. It is caused by a germ that starts growing in that space. You may feel swollen glands in your neck on the side of the ear infection. HOME CARE INSTRUCTIONS   Take your medicine as directed until it is gone, even if you feel better after the first few days.   Only take over-the-counter or prescription medicines for pain, discomfort, or fever as directed by your caregiver.   Occasional use of a nasal decongestant a couple times per day may help with discomfort and help the eustachian tube to drain better.  Follow up with your caregiver in 10 to 14 days or as directed, to be certain that the infection has cleared. Not keeping the appointment could result in a chronic or permanent injury, pain, hearing loss and disability. If there is any problem keeping the appointment, you must call back to this facility for assistance. SEEK IMMEDIATE MEDICAL CARE IF:   You are not getting better in 2 to 3 days.   You have pain that is not controlled with medication.   You feel worse instead of better.   You cannot use the medication as directed.   You develop swelling, redness or pain around the ear or stiffness in your neck.  MAKE SURE YOU:   Understand these instructions.   Will watch your condition.   Will get help right away if you are not doing well or get worse.  Document Released: 12/13/2003 Document Revised: 02/26/2011 Document Reviewed: 10/14/2007 [facility] Patient Information 2012 [place], [place].  Cerumen Impaction A cerumen impaction is when the wax in your ear forms a plug. This plug usually causes reduced hearing. Sometimes it also causes an earache or dizziness. Removing a cerumen impaction can be difficult and painful. The wax sticks to the ear canal. The canal is sensitive and bleeds easily. If you try to remove a  heavy wax buildup with a cotton tipped swab, you may push it in further. Irrigation with water, suction, and small ear curettes may be used to clear out the wax. If the impaction is fixed to the skin in the ear canal, ear drops may be needed for a few days to loosen the wax. People who build up a lot of wax frequently can use ear wax removal products available in your local drugstore. SEEK MEDICAL CARE IF:  You develop an earache, increased hearing loss, or marked dizziness. Document Released: 04/16/2004 Document Revised: 02/26/2011 Document Reviewed: 06/06/2009 [facility] Patient Information 2012 [place], [place].  Amoxicillin capsules or tablets What is this medicine? AMOXICILLIN (a mox i SIL in) is a penicillin antibiotic. It is used to treat certain kinds of bacterial infections. It will not work for colds, flu, or other viral infections. This medicine may be used for other purposes; ask your health care provider or pharmacist if you have questions. What should I tell my health care provider before I take this medicine? They need to know if you have any of these conditions: -asthma -kidney disease -an unusual or allergic reaction to amoxicillin, other penicillins, cephalosporin antibiotics, other medicines, foods, dyes, or preservatives -pregnant or trying to get pregnant -breast-feeding How should I use this medicine? Take this medicine by mouth with a glass of water. Follow the directions on your prescription label. You may take this medicine with food or on an empty stomach. Take  your medicine at regular intervals. Do not take your medicine more often than directed. Take all of your medicine as directed even if you think your are better. Do not skip doses or stop your medicine early. Talk to your pediatrician regarding the use of this medicine in children. While this drug may be prescribed for selected conditions, precautions do apply. Overdosage: If you think you have taken too much of this  medicine contact a poison control center or emergency room at once. NOTE: This medicine is only for you. Do not share this medicine with others. What if I miss a dose? If you miss a dose, take it as soon as you can. If it is almost time for your next dose, take only that dose. Do not take double or extra doses. What may interact with this medicine? -amiloride -birth control pills -chloramphenicol -macrolides -probenecid -sulfonamides -tetracyclines This list may not describe all possible interactions. Give your health care provider a list of all the medicines, herbs, non-prescription drugs, or dietary supplements you use. Also tell them if you smoke, drink alcohol, or use illegal drugs. Some items may interact with your medicine. What should I watch for while using this medicine? Tell your doctor or health care professional if your symptoms do not improve in 2 or 3 days. Take all of the doses of your medicine as directed. Do not skip doses or stop your medicine early. If you are diabetic, you may get a false positive result for sugar in your urine with certain brands of urine tests. Check with your doctor. Do not treat diarrhea with over-the-counter products. Contact your doctor if you have diarrhea that lasts more than 2 days or if the diarrhea is severe and watery. What side effects may I notice from receiving this medicine? Side effects that you should report to your doctor or health care professional as soon as possible: -allergic reactions like skin rash, itching or hives, swelling of the face, lips, or tongue -breathing problems -dark urine -redness, blistering, peeling or loosening of the skin, including inside the mouth -seizures -severe or watery diarrhea -trouble passing urine or change in the amount of urine -unusual bleeding or bruising -unusually weak or tired -yellowing of the eyes or skin Side effects that usually do not require medical attention (report to your doctor or  health care professional if they continue or are bothersome): -dizziness -headache -stomach upset -trouble sleeping This list may not describe all possible side effects. Call your doctor for medical advice about side effects. You may report side effects to FDA at 1-800-FDA-1088. Where should I keep my medicine? Keep out of the reach of children. Store between 68 and 77 degrees F (20 and 25 degrees C). Keep bottle closed tightly. Throw away any unused medicine after the expiration date. NOTE: This sheet is a summary. It may not cover all possible information. If you have questions about this medicine, talk to your doctor, pharmacist, or health care provider.  2012, [employer] Standard. (05/31/2007 2:10:59 PM)  Acetaminophen; Oxycodone tablets What is this medicine? ACETAMINOPHEN; OXYCODONE (a set a MEE noe fen; ox i KOE done) is a pain reliever. It is used to treat mild to moderate pain. This medicine may be used for other purposes; ask your health care provider or pharmacist if you have questions. What should I tell my health care provider before I take this medicine? They need to know if you have any of these conditions: -brain tumor -Crohn's disease, inflammatory bowel disease, or ulcerative  colitis -drink more than 3 alcohol containing drinks per day -drug abuse or addiction -head injury -heart or circulation problems -kidney disease or problems going to the bathroom -liver disease -lung disease, asthma, or breathing problems -an unusual or allergic reaction to acetaminophen, oxycodone, other opioid analgesics, other medicines, foods, dyes, or preservatives -pregnant or trying to get pregnant -breast-feeding How should I use this medicine? Take this medicine by mouth with a full glass of water. Follow the directions on the prescription label. Take your medicine at regular intervals. Do not take your medicine more often than directed. Talk to your pediatrician regarding the use  of this medicine in children. Special care may be needed. Patients over 30 years old may have a stronger reaction and need a smaller dose. Overdosage: If you think you have taken too much of this medicine contact a poison control center or emergency room at once. NOTE: This medicine is only for you. Do not share this medicine with others. What if I miss a dose? If you miss a dose, take it as soon as you can. If it is almost time for your next dose, take only that dose. Do not take double or extra doses. What may interact with this medicine? -alcohol or medicines that contain alcohol -antihistamines -barbiturates like amobarbital, butalbital, butabarbital, methohexital, pentobarbital, phenobarbital, thiopental, and secobarbital -benztropine -drugs for bladder problems like solifenacin, trospium, oxybutynin, tolterodine, hyoscyamine, and methscopolamine -drugs for breathing problems like ipratropium and tiotropium -drugs for certain stomach or intestine problems like propantheline, homatropine methylbromide, glycopyrrolate, atropine, belladonna, and dicyclomine -general anesthetics like etomidate, ketamine, nitrous oxide, propofol, desflurane, enflurane, halothane, isoflurane, and sevoflurane -medicines for depression, anxiety, or psychotic disturbances -medicines for pain like codeine, morphine, pentazocine, buprenorphine, butorphanol, nalbuphine, tramadol, and propoxyphene -medicines for sleep -muscle relaxants -naltrexone -phenothiazines like perphenazine, thioridazine, chlorpromazine, mesoridazine, fluphenazine, prochlorperazine, promazine, and trifluoperazine -scopolamine -trihexyphenidyl This list may not describe all possible interactions. Give your health care provider a list of all the medicines, herbs, non-prescription drugs, or dietary supplements you use. Also tell them if you smoke, drink alcohol, or use illegal drugs. Some items may interact with your medicine. What should I watch  for while using this medicine? Tell your doctor or health care professional if your pain does not go away, if it gets worse, or if you have new or a different type of pain. You may develop tolerance to the medicine. Tolerance means that you will need a higher dose of the medication for pain relief. Tolerance is normal and is expected if you take this medicine for a long time. Do not suddenly stop taking your medicine because you may develop a severe reaction. Your body becomes used to the medicine. This does NOT mean you are addicted. Addiction is a behavior related to getting and using a drug for a nonmedical reason. If you have pain, you have a medical reason to take pain medicine. Your doctor will tell you how much medicine to take. If your doctor wants you to stop the medicine, the dose will be slowly lowered over time to avoid any side effects. You may get drowsy or dizzy. Do not drive, use machinery, or do anything that needs mental alertness until you know how this medicine affects you. Do not stand or sit up quickly, especially if you are an older patient. This reduces the risk of dizzy or fainting spells. Alcohol may interfere with the effect of this medicine. Avoid alcoholic drinks. The medicine will cause constipation. Try to have a  bowel movement at least every 2 to 3 days. If you do not have a bowel movement for 3 days, call your doctor or health care professional. Do not take Tylenol (acetaminophen) or medicines that have acetaminophen with this medicine. Too much acetaminophen can be very dangerous. Many nonprescription medicines contain acetaminophen. Always read the labels carefully to avoid taking more acetaminophen. What side effects may I notice from receiving this medicine? Side effects that you should report to your doctor or health care professional as soon as possible: -allergic reactions like skin rash, itching or hives, swelling of the face, lips, or tongue -breathing difficulties,  wheezing -confusion -light headedness or fainting spells -severe stomach pain -yellowing of the skin or the whites of the eyes Side effects that usually do not require medical attention (report to your doctor or health care professional if they continue or are bothersome): -dizziness -drowsiness -nausea -vomiting This list may not describe all possible side effects. Call your doctor for medical advice about side effects. You may report side effects to FDA at 1-800-FDA-1088. Where should I keep my medicine? Keep out of the reach of children. This medicine can be abused. Keep your medicine in a safe place to protect it from theft. Do not share this medicine with anyone. Selling or giving away this medicine is dangerous and against the law. Store at room temperature between 20 and 25 degrees C (68 and 77 degrees F). Keep container tightly closed. Protect from light. Flush any unused medicines down the toilet. Do not use the medicine after the expiration date. NOTE: This sheet is a summary. It may not cover all possible information. If you have questions about this medicine, talk to your doctor, pharmacist, or health care provider.  2012, [employer]vier/[employer] Standard. (02/06/2008 10:01:21 AM)

## 2012-03-11 ENCOUNTER — Other Ambulatory Visit: Payer: Self-pay | Admitting: Family Medicine

## 2012-03-11 NOTE — Telephone Encounter (Signed)
Refused request for albuterol. Patient not a known asthmatic and was given for bronchitis at visit approximately 6 months ago. She will need to be seen as instructed at that visit and as noted in AVS if she is to get further refills.

## 2012-03-25 ENCOUNTER — Ambulatory Visit (INDEPENDENT_AMBULATORY_CARE_PROVIDER_SITE_OTHER): Payer: Medicaid Other | Admitting: Family Medicine

## 2012-03-25 ENCOUNTER — Encounter: Payer: Self-pay | Admitting: Family Medicine

## 2012-03-25 VITALS — BP 130/65 | HR 78 | Temp 98.8°F | Ht 62.0 in | Wt 244.8 lb

## 2012-03-25 DIAGNOSIS — J40 Bronchitis, not specified as acute or chronic: Secondary | ICD-10-CM

## 2012-03-25 MED ORDER — BENZONATATE 200 MG PO CAPS: 200.0000 mg | ORAL_CAPSULE | Freq: Two times a day (BID) | ORAL | Status: DC | PRN

## 2012-03-25 MED ORDER — AMOXICILLIN-POT CLAVULANATE 875-125 MG PO TABS: 1.0000 | ORAL_TABLET | Freq: Two times a day (BID) | ORAL | Status: DC

## 2012-03-25 NOTE — Progress Notes (Signed)
  Subjective:     Joanne Hampton is a 42 y.o. female who presents for evaluation of sinus pain and cough/congestion. Symptoms include: nasal congestion, post nasal drip, purulent rhinorrhea, sinus pressure and productive cough and left side headache. she also had transient wheezing that cleared 2 night ago. Cough worse at night. She initially had a cold that started 2 weeks ago, improved but now having worsening of symptoms since 4 days ago. Symptoms have been stable since that time. Past history is significant for one episode of bronchitis. Patient is a non-smoker. She had an albuterol inhaler from bronchitis 6 months ago and tried with slight subjective improvement.   No history asthma.  The following portions of the patient's history were reviewed and updated as appropriate: allergies, current medications, past family history, past medical history, past social history, past surgical history and problem list.  Review of Systems Pertinent items are noted in HPI.   Objective:    BP 130/65  Pulse 78  Temp 98.8 F (37.1 C) (Oral)  Ht 5\' 2"  (1.575 m)  Wt 244 lb 12.8 oz (111.041 kg)  BMI 44.77 kg/m2  SpO2 100% General appearance: alert, cooperative and no distress Head: Normocephalic, without obvious abnormality, atraumatic Eyes: negative findings: conjunctivae and sclerae normal and pupils equal, round, reactive to light and accomodation Ears: normal TM's and external ear canals both ears Nose: mucoid discharge, moderate congestion, no sinus tenderness Throat: lips, mucosa, and tongue normal; teeth and gums normal Neck: moderate anterior cervical adenopathy and thyroid not enlarged, symmetric, no tenderness/mass/nodules Lungs: clear to auscultation bilaterally Heart: regular rate and rhythm, S1, S2 normal, no murmur, click, rub or gallop    Assessment:    Acute bacterial sinusitis.    Plan:    Nasal saline sprays. Nasal steroids continue as needed. Augmentin per medication  orders x5 days Follow up in 5 days if not improving or as needed.

## 2012-03-25 NOTE — Patient Instructions (Addendum)
You seem to have a sinus infection. Start augmentin antibiotic x 5 days. Try tessalon for cough.  Use nasal saline and OTC decongestant phenylephrine as needed. Call if not improving in next few days or if symptoms worsen.  Sinusitis Sinusitis is redness, soreness, and swelling (inflammation) of the paranasal sinuses. Paranasal sinuses are air pockets within the bones of your face (beneath the eyes, the middle of the forehead, or above the eyes). In healthy paranasal sinuses, mucus is able to drain out, and air is able to circulate through them by way of your nose. However, when your paranasal sinuses are inflamed, mucus and air can become trapped. This can allow bacteria and other germs to grow and cause infection. Sinusitis can develop quickly and last only a short time (acute) or continue over a long period (chronic). Sinusitis that lasts for more than 12 weeks is considered chronic.  CAUSES  Causes of sinusitis include:  Allergies.  Structural abnormalities, such as displacement of the cartilage that separates your nostrils (deviated septum), which can decrease the air flow through your nose and sinuses and affect sinus drainage.  Functional abnormalities, such as when the small hairs (cilia) that line your sinuses and help remove mucus do not work properly or are not present. SYMPTOMS  Symptoms of acute and chronic sinusitis are the same. The primary symptoms are pain and pressure around the affected sinuses. Other symptoms include:  Upper toothache.  Earache.  Headache.  Bad breath.  Decreased sense of smell and taste.  A cough, which worsens when you are lying flat.  Fatigue.  Fever.  Thick drainage from your nose, which often is green and may contain pus (purulent).  Swelling and warmth over the affected sinuses. DIAGNOSIS  Your caregiver will perform a physical exam. During the exam, your caregiver may:  Look in your nose for signs of abnormal growths in your  nostrils (nasal polyps).  Tap over the affected sinus to check for signs of infection.  View the inside of your sinuses (endoscopy) with a special imaging device with a light attached (endoscope), which is inserted into your sinuses. If your caregiver suspects that you have chronic sinusitis, one or more of the following tests may be recommended:  Allergy tests.  Nasal culture A sample of mucus is taken from your nose and sent to a lab and screened for bacteria.  Nasal cytology A sample of mucus is taken from your nose and examined by your caregiver to determine if your sinusitis is related to an allergy. TREATMENT  Most cases of acute sinusitis are related to a viral infection and will resolve on their own within 10 days. Sometimes medicines are prescribed to help relieve symptoms (pain medicine, decongestants, nasal steroid sprays, or saline sprays).  However, for sinusitis related to a bacterial infection, your caregiver will prescribe antibiotic medicines. These are medicines that will help kill the bacteria causing the infection.  Rarely, sinusitis is caused by a fungal infection. In theses cases, your caregiver will prescribe antifungal medicine. For some cases of chronic sinusitis, surgery is needed. Generally, these are cases in which sinusitis recurs more than 3 times per year, despite other treatments. HOME CARE INSTRUCTIONS   Drink plenty of water. Water helps thin the mucus so your sinuses can drain more easily.  Use a humidifier.  Inhale steam 3 to 4 times a day (for example, sit in the bathroom with the shower running).  Apply a warm, moist washcloth to your face 3 to 4 times  a day, or as directed by your caregiver.  Use saline nasal sprays to help moisten and clean your sinuses.  Take over-the-counter or prescription medicines for pain, discomfort, or fever only as directed by your caregiver. SEEK IMMEDIATE MEDICAL CARE IF:  You have increasing pain or severe  headaches.  You have nausea, vomiting, or drowsiness.  You have swelling around your face.  You have vision problems.  You have a stiff neck.  You have difficulty breathing. MAKE SURE YOU:   Understand these instructions.  Will watch your condition.  Will get help right away if you are not doing well or get worse. Document Released: 03/09/2005 Document Revised: 06/01/2011 Document Reviewed: 03/24/2011 [facility] Patient Information 2013 [place], [place].

## 2012-04-15 ENCOUNTER — Encounter: Payer: Self-pay | Admitting: Family Medicine

## 2012-04-15 ENCOUNTER — Ambulatory Visit (INDEPENDENT_AMBULATORY_CARE_PROVIDER_SITE_OTHER): Payer: Medicaid Other | Admitting: Family Medicine

## 2012-04-15 VITALS — BP 121/75 | HR 80 | Temp 98.8°F | Ht 64.0 in | Wt 250.0 lb

## 2012-04-15 DIAGNOSIS — R21 Rash and other nonspecific skin eruption: Secondary | ICD-10-CM

## 2012-04-15 MED ORDER — TRIAMCINOLONE ACETONIDE 0.025 % EX OINT: TOPICAL_OINTMENT | Freq: Two times a day (BID) | CUTANEOUS | Status: DC

## 2012-04-15 NOTE — Progress Notes (Signed)
  Subjective:    Patient ID: Joanne Hampton, female    DOB: [birth year], 42 y.o.   MRN: [id number]  HPI  Joanne Hampton comes in with a rash for a few days.  She says it itches, but it comes and goes.  She has not had fevers chills, URI symptoms.  She is concerned because her son was treated for scabies recently.  They do not have pets.    Patient Active Problem List  Diagnosis  . OBESITY, UNSPECIFIED  . ANEMIA  . MIGRAINE, UNSPEC., W/O INTRACTABLE MIGRAINE  . PAP SMEAR, LGSIL, ABNORMAL  . TRICHOMONAL VULVOVAGINITIS  . Physical exam, annual  . Bacterial vaginosis  . Bronchitis  . Allergic rhinitis, seasonal   Review of Systems See HPI    Objective:   Physical Exam BP 121/75  Pulse 80  Temp 98.8 F (37.1 C) (Oral)  Ht 5\' 4"  (1.626 m)  Wt 250 lb (113.399 kg)  BMI 42.91 kg/m2 General appearance: alert, cooperative and no distress Skin: Patient has a few raised bumps, one mid L lower arm, one mid R upper arms, a few on her shoulders.  There are no bumps in the finger webs, wrist flexors, antecubital fossa.       Assessment & Plan:

## 2012-04-15 NOTE — Assessment & Plan Note (Signed)
Does not look like scabies.  Skin generally dry, suspect with hx of allergies and asthma that this may be more of an eczematous picture.  Rx triamcinolone ointment, discussed eczema care.

## 2012-04-15 NOTE — Patient Instructions (Signed)
Your bumps do not look like scabies- I think this is an allergic dry skin rash.  Please use the prescription ointment on the rash, and use a good lotion all over your body.  Avoid products with scent or dye in them.

## 2012-09-15 ENCOUNTER — Other Ambulatory Visit (HOSPITAL_COMMUNITY)
Admission: RE | Admit: 2012-09-15 | Discharge: 2012-09-15 | Disposition: A | Discharge status: Home / Self Care | Payer: Medicaid Other | Source: Ambulatory Visit | Attending: Emergency Medicine | Admitting: Emergency Medicine

## 2012-09-15 ENCOUNTER — Encounter (HOSPITAL_COMMUNITY): Payer: Self-pay | Admitting: *Deleted

## 2012-09-15 ENCOUNTER — Emergency Department (HOSPITAL_COMMUNITY)
Admission: EM | Admit: 2012-09-15 | Discharge: 2012-09-15 | Disposition: A | Discharge status: Home / Self Care | Payer: Medicaid Other | Source: Home / Self Care | Attending: Emergency Medicine | Admitting: Emergency Medicine

## 2012-09-15 DIAGNOSIS — N898 Other specified noninflammatory disorders of vagina: Secondary | ICD-10-CM

## 2012-09-15 DIAGNOSIS — N949 Unspecified condition associated with female genital organs and menstrual cycle: Secondary | ICD-10-CM

## 2012-09-15 DIAGNOSIS — R590 Localized enlarged lymph nodes: Secondary | ICD-10-CM

## 2012-09-15 DIAGNOSIS — Z113 Encounter for screening for infections with a predominantly sexual mode of transmission: Secondary | ICD-10-CM | POA: Insufficient documentation

## 2012-09-15 DIAGNOSIS — N760 Acute vaginitis: Secondary | ICD-10-CM | POA: Insufficient documentation

## 2012-09-15 DIAGNOSIS — R599 Enlarged lymph nodes, unspecified: Secondary | ICD-10-CM

## 2012-09-15 MED ORDER — METRONIDAZOLE 500 MG PO TABS: 500.0000 mg | ORAL_TABLET | Freq: Two times a day (BID) | ORAL | Status: AC

## 2012-09-15 NOTE — ED Provider Notes (Signed)
History    CSN: [id number] Arrival date & time 09/15/12  1541  First MD Initiated Contact with Patient 09/15/12 1735     Chief Complaint  Patient presents with  . Vaginal Itching   (Consider location/radiation/quality/duration/timing/severity/associated sxs/prior Treatment) HPI Comments: Patient presents to see urgent care describing that she has been expressing some vaginal itching and a strong vaginal order. She is also noted in her vaginal introitus a palpable " lump", perhaps 2. She denies any urinary symptoms such as increased frequency pressure burning with urination denies any sores on her genitalia. She does not have significant concerns about having been exposed to any sexually transmitted illness. Denies any vaginal bleeding, and denies any constitutional symptoms like unintentional weight loss pelvic pain or back pain.  Patient is a 42 y.o. female presenting with vaginal itching. The history is provided by the patient.  Vaginal Itching This is a recurrent problem. The problem occurs constantly. The problem has not changed since onset.Pertinent negatives include no chest pain, no abdominal pain and no shortness of breath. She has tried nothing for the symptoms. The treatment provided no relief.   Past Medical History  Diagnosis Date  . Bronchitis    Past Surgical History  Procedure Laterality Date  . Cesarean section  1990   Family History  Problem Relation Age of Onset  . Heart failure Mother    History  Substance Use Topics  . Smoking status: Never Smoker   . Smokeless tobacco: Not on file  . Alcohol Use: Yes     Comment: occasional   OB History   Grav Para Term Preterm Abortions TAB SAB Ect Mult Living                 Review of Systems  Constitutional: Negative for fever and chills.  Respiratory: Negative for shortness of breath.   Cardiovascular: Negative for chest pain.  Gastrointestinal: Negative for nausea, vomiting and abdominal pain.  Genitourinary:  Positive for vaginal discharge. Negative for dysuria, frequency, decreased urine volume, vaginal bleeding, genital sores, vaginal pain and pelvic pain.  Skin: Negative for color change, pallor and rash.  Allergic/Immunologic: Negative for immunocompromised state.    Allergies  Review of patient's allergies indicates no known allergies.  Home Medications   Current Outpatient Rx  Name  Route  Sig  Dispense  Refill  . Multiple Vitamin (MULTIVITAMIN) tablet   Oral   Take 1 tablet by mouth daily.         [name] EXPIRED: albuterol (PROVENTIL HFA;VENTOLIN HFA) 108 (90 BASE) MCG/ACT inhaler   Inhalation   Inhale 2 puffs into the lungs every 6 (six) hours as needed for wheezing.   1 Inhaler   0   . EXPIRED: cetirizine (ZYRTEC) 10 MG tablet   Oral   Take 1 tablet (10 mg total) by mouth daily.   30 tablet   11   . EXPIRED: fluticasone (FLONASE) 50 MCG/ACT nasal spray   Nasal   Place 2 sprays into the nose daily.   16 g   6   . metroNIDAZOLE (FLAGYL) 500 MG tablet   Oral   Take 1 tablet (500 mg total) by mouth 2 (two) times daily.   14 tablet   0   . triamcinolone (KENALOG) 0.025 % ointment   Topical   Apply topically 2 (two) times daily.   30 g   0    BP 131/73  Pulse 75  Temp(Src) 98.5 F (36.9 C) (Oral)  Resp 16  SpO2 100%  LMP 07/26/2012 Physical Exam  Vitals reviewed. Constitutional: Vital signs are normal. She appears well-developed and well-nourished.  Non-toxic appearance. She does not have a sickly appearance. She does not appear ill. No distress.  Abdominal: Soft. She exhibits no distension. There is no tenderness. There is no rebound. Hernia confirmed negative in the right inguinal area.  Genitourinary: Guaiac negative stool.    There is no rash on the right labia. There is no rash on the left labia. Cervix exhibits no motion tenderness and no friability. No erythema, tenderness or bleeding around the vagina. No foreign body around the vagina. No signs of  injury around the vagina. Vaginal discharge found.  Global generalized mucousy type discharge with a fishy oral character. Suspicious for bacterial vaginosis.  Lymphadenopathy:       Left: Inguinal adenopathy present.  Neurological: She is alert.  Skin: No rash noted. No erythema. No pallor.    ED Course  Procedures (including critical care time) Labs Reviewed  CERVICOVAGINAL ANCILLARY ONLY   No results found. 1. Lymphadenopathy of other site   2. Vaginal odor     MDM  2 superficial external genitalia lymphadenopathies. No signs of any soft tissue infection. Potentially vaginal irritation in order to be bacterial vaginosis have proposed empirical treatment the patient with Flagyl for 7 days. Patient has been advised to followup with lymphadenopathies persist or exacerbate or she gets to know his anything new with a gynecologist. She agrees and understands necessary followup. Discharge Medication List as of 09/15/2012  6:19 PM    START taking these medications   Details  metroNIDAZOLE (FLAGYL) 500 MG tablet Take 1 tablet (500 mg total) by mouth 2 (two) times daily., Starting 09/15/2012, Last dose on Thu 09/22/12, Normal        Jimmie Molly, MD 09/15/12 [phone]

## 2012-09-15 NOTE — ED Notes (Addendum)
C/o abscess in her vagina onset 1 week ago.  No drainage.  C/o itching and only gets sore when she scratches it.

## 2012-09-17 NOTE — ED Notes (Signed)
GC/Chlamydia neg., Affirm: Gardnerella pos., Candida and Trich neg.  Pt. adequately treated with Flagyl. Vassie Moselle 09/17/2012

## 2012-10-08 ENCOUNTER — Ambulatory Visit: Payer: Self-pay | Admitting: Emergency Medicine

## 2012-10-08 VITALS — BP 112/70 | HR 67 | Temp 98.3°F | Resp 16 | Ht 64.0 in | Wt 250.0 lb

## 2012-10-08 DIAGNOSIS — Z0289 Encounter for other administrative examinations: Secondary | ICD-10-CM

## 2012-10-08 DIAGNOSIS — Z0000 Encounter for general adult medical examination without abnormal findings: Secondary | ICD-10-CM

## 2012-10-08 NOTE — Progress Notes (Signed)
  Subjective:    Patient ID: Joanne Hampton, female    DOB: 12-Dec-1970, 42 y.o.   MRN: [id number]  HPI patient here for DOT exam. Past medical history is negative for diabetes high blood pressure. She is on no medications at the present time    Review of Systems     Objective:   Physical Exam HEENT exam TMs are clear. Nose is normal. Throat normal. Neck supple. Chest is clear to both auscultation and percussion. Heart regular rate no murmurs. Abdomen soft nontender. Extremities without edema.        Assessment & Plan:  Patient here for DOT. She qualifies for two-year card.

## 2012-10-14 ENCOUNTER — Encounter: Payer: Self-pay | Admitting: Emergency Medicine

## 2014-05-03 ENCOUNTER — Encounter (HOSPITAL_COMMUNITY): Payer: Self-pay | Admitting: Emergency Medicine

## 2014-05-03 ENCOUNTER — Emergency Department (INDEPENDENT_AMBULATORY_CARE_PROVIDER_SITE_OTHER)
Admission: EM | Admit: 2014-05-03 | Discharge: 2014-05-03 | Disposition: A | Discharge status: Home / Self Care | Payer: Self-pay | Source: Home / Self Care | Attending: Family Medicine | Admitting: Family Medicine

## 2014-05-03 DIAGNOSIS — J029 Acute pharyngitis, unspecified: Secondary | ICD-10-CM

## 2014-05-03 DIAGNOSIS — K219 Gastro-esophageal reflux disease without esophagitis: Secondary | ICD-10-CM

## 2014-05-03 LAB — POCT RAPID STREP A: Streptococcus, Group A Screen (Direct): NEGATIVE

## 2014-05-03 MED ORDER — FEXOFENADINE HCL 180 MG PO TABS: 180.0000 mg | ORAL_TABLET | Freq: Every day | ORAL | Status: DC

## 2014-05-03 MED ORDER — OMEPRAZOLE 20 MG PO CPDR: 20.0000 mg | DELAYED_RELEASE_CAPSULE | Freq: Every day | ORAL | Status: DC

## 2014-05-03 MED ORDER — FLUTICASONE PROPIONATE 50 MCG/ACT NA SUSP: 2.0000 | Freq: Two times a day (BID) | NASAL | Status: DC

## 2014-05-03 MED ORDER — DOXYCYCLINE HYCLATE 100 MG PO CAPS
100.0000 mg | ORAL_CAPSULE | Freq: Two times a day (BID) | ORAL | Status: DC
Start: 2014-05-03 — End: 2014-10-23

## 2014-05-03 NOTE — Discharge Instructions (Signed)
Food Choices for Gastroesophageal Reflux Disease When you have gastroesophageal reflux disease (GERD), the foods you eat and your eating habits are very important. Choosing the right foods can help ease the discomfort of GERD. WHAT GENERAL GUIDELINES DO I NEED TO FOLLOW?  Choose fruits, vegetables, whole grains, low-fat dairy products, and low-fat meat, fish, and poultry.  Limit fats such as oils, salad dressings, butter, nuts, and avocado.  Keep a food diary to identify foods that cause symptoms.  Avoid foods that cause reflux. These may be different for different people.  Eat frequent small meals instead of three large meals each day.  Eat your meals slowly, in a relaxed setting.  Limit fried foods.  Cook foods using methods other than frying.  Avoid drinking alcohol.  Avoid drinking large amounts of liquids with your meals.  Avoid bending over or lying down until 2-3 hours after eating. WHAT FOODS ARE NOT RECOMMENDED? The following are some foods and drinks that may worsen your symptoms: Vegetables Tomatoes. Tomato juice. Tomato and spaghetti sauce. Chili peppers. Onion and garlic. Horseradish. Fruits Oranges, grapefruit, and lemon (fruit and juice). Meats High-fat meats, fish, and poultry. This includes hot dogs, ribs, ham, sausage, salami, and bacon. Dairy Whole milk and chocolate milk. Sour cream. Cream. Butter. Ice cream. Cream cheese.  Beverages Coffee and tea, with or without caffeine. Carbonated beverages or energy drinks. Condiments Hot sauce. Barbecue sauce.  Sweets/Desserts Chocolate and cocoa. Donuts. Peppermint and spearmint. Fats and Oils High-fat foods, including [place] fries and potato chips. Other Vinegar. Strong spices, such as black pepper, white pepper, red pepper, cayenne, curry powder, cloves, ginger, and chili powder. The items listed above may not be a complete list of foods and beverages to avoid. Contact your dietitian for more  information. Document Released: 03/09/2005 Document Revised: 03/14/2013 Document Reviewed: 01/11/2013 [facility] Patient Information 2015 [place], [place]. This information is not intended to replace advice given to you by your health care provider. Make sure you discuss any questions you have with your health care provider.  Gastroesophageal Reflux Disease, Adult Gastroesophageal reflux disease (GERD) happens when acid from your stomach flows up into the esophagus. When acid comes in contact with the esophagus, the acid causes soreness (inflammation) in the esophagus. Over time, GERD may create small holes (ulcers) in the lining of the esophagus. CAUSES   Increased body weight. This puts pressure on the stomach, making acid rise from the stomach into the esophagus.  Smoking. This increases acid production in the stomach.  Drinking alcohol. This causes decreased pressure in the lower esophageal sphincter (valve or ring of muscle between the esophagus and stomach), allowing acid from the stomach into the esophagus.  Late evening meals and a full stomach. This increases pressure and acid production in the stomach.  A malformed lower esophageal sphincter. Sometimes, no cause is found. SYMPTOMS   Burning pain in the lower part of the mid-chest behind the breastbone and in the mid-stomach area. This may occur twice a week or more often.  Trouble swallowing.  Sore throat.  Dry cough.  Asthma-like symptoms including chest tightness, shortness of breath, or wheezing. DIAGNOSIS  Your caregiver may be able to diagnose GERD based on your symptoms. In some cases, X-rays and other tests may be done to check for complications or to check the condition of your stomach and esophagus. TREATMENT  Your caregiver may recommend over-the-counter or prescription medicines to help decrease acid production. Ask your caregiver before starting or adding any new medicines.  HOME  CARE INSTRUCTIONS   Change the  factors that you can control. Ask your caregiver for guidance concerning weight loss, quitting smoking, and alcohol consumption.  Avoid foods and drinks that make your symptoms worse, such as:  Caffeine or alcoholic drinks.  Chocolate.  Peppermint or mint flavorings.  Garlic and onions.  Spicy foods.  Citrus fruits, such as oranges, lemons, or limes.  Tomato-based foods such as sauce, chili, salsa, and pizza.  Fried and fatty foods.  Avoid lying down for the 3 hours prior to your bedtime or prior to taking a nap.  Eat small, frequent meals instead of large meals.  Wear loose-fitting clothing. Do not wear anything tight around your waist that causes pressure on your stomach.  Raise the head of your bed 6 to 8 inches with wood blocks to help you sleep. Extra pillows will not help.  Only take over-the-counter or prescription medicines for pain, discomfort, or fever as directed by your caregiver.  Do not take aspirin, ibuprofen, or other nonsteroidal anti-inflammatory drugs (NSAIDs). SEEK IMMEDIATE MEDICAL CARE IF:   You have pain in your arms, neck, jaw, teeth, or back.  Your pain increases or changes in intensity or duration.  You develop nausea, vomiting, or sweating (diaphoresis).  You develop shortness of breath, or you faint.  Your vomit is green, yellow, black, or looks like coffee grounds or blood.  Your stool is red, bloody, or black. These symptoms could be signs of other problems, such as heart disease, gastric bleeding, or esophageal bleeding. MAKE SURE YOU:   Understand these instructions.  Will watch your condition.  Will get help right away if you are not doing well or get worse. Document Released: 12/17/2004 Document Revised: 06/01/2011 Document Reviewed: 09/26/2010 [facility] Patient Information 2015 [place]. This information is not intended to replace advice given to you by your health care provider. Make sure you discuss any questions you have  with your health care provider.  Pharyngitis Pharyngitis is redness, pain, and swelling (inflammation) of your pharynx.  CAUSES  Pharyngitis is usually caused by infection. Most of the time, these infections are from viruses (viral) and are part of a cold. However, sometimes pharyngitis is caused by bacteria (bacterial). Pharyngitis can also be caused by allergies. Viral pharyngitis may be spread from person to person by coughing, sneezing, and personal items or utensils (cups, forks, spoons, toothbrushes). Bacterial pharyngitis may be spread from person to person by more intimate contact, such as kissing.  SIGNS AND SYMPTOMS  Symptoms of pharyngitis include:   Sore throat.   Tiredness (fatigue).   Low-grade fever.   Headache.  Joint pain and muscle aches.  Skin rashes.  Swollen lymph nodes.  Plaque-like film on throat or tonsils (often seen with bacterial pharyngitis). DIAGNOSIS  Your health care provider will ask you questions about your illness and your symptoms. Your medical history, along with a physical exam, is often all that is needed to diagnose pharyngitis. Sometimes, a rapid strep test is done. Other lab tests may also be done, depending on the suspected cause.  TREATMENT  Viral pharyngitis will usually get better in 3-4 days without the use of medicine. Bacterial pharyngitis is treated with medicines that kill germs (antibiotics).  HOME CARE INSTRUCTIONS   Drink enough water and fluids to keep your urine clear or pale yellow.   Only take over-the-counter or prescription medicines as directed by your health care provider:   If you are prescribed antibiotics, make sure you finish them even if you  start to feel better.   Do not take aspirin.   Get lots of rest.   Gargle with 8 oz of salt water ( tsp of salt per 1 qt of water) as often as every 1-2 hours to soothe your throat.   Throat lozenges (if you are not at risk for choking) or sprays may be used to  soothe your throat. SEEK MEDICAL CARE IF:   You have large, tender lumps in your neck.  You have a rash.  You cough up green, yellow-brown, or bloody spit. SEEK IMMEDIATE MEDICAL CARE IF:   Your neck becomes stiff.  You drool or are unable to swallow liquids.  You vomit or are unable to keep medicines or liquids down.  You have severe pain that does not go away with the use of recommended medicines.  You have trouble breathing (not caused by a stuffy nose). MAKE SURE YOU:   Understand these instructions.  Will watch your condition.  Will get help right away if you are not doing well or get worse. Document Released: 03/09/2005 Document Revised: 12/28/2012 Document Reviewed: 11/14/2012 [facility] Patient Information 2015 [place], [place]. This information is not intended to replace advice given to you by your health care provider. Make sure you discuss any questions you have with your health care provider.

## 2014-05-03 NOTE — ED Provider Notes (Signed)
CSN: [id number]     Arrival date & time 05/03/14  1718 History   First MD Initiated Contact with Patient 05/03/14 1746     Chief Complaint  Patient presents with  . Sore Throat   (Consider location/radiation/quality/duration/timing/severity/associated sxs/prior Treatment) Patient is a 44 y.o. female presenting with pharyngitis.  Sore Throat Pertinent negatives include no chest pain, no abdominal pain and no shortness of breath.           44 year old female presents for evaluation of throat swelling. For 3/2 weeks she has subjective throat swelling that is worse at night. It occasionally wakes her from her sleep. She initially had subjective fevers and ear pain as well but those symptoms have gotten better. She also has mild nasal congestion at night, postnasal drainage. Also she admits to having some reflux symptoms recently. Denies any rash or any other systemic symptoms.  Past Medical History  Diagnosis Date  . Bronchitis    Past Surgical History  Procedure Laterality Date  . Cesarean section  1990   Family History  Problem Relation Age of Onset  . Heart failure Mother    History  Substance Use Topics  . Smoking status: Never Smoker   . Smokeless tobacco: Not on file  . Alcohol Use: Yes     Comment: occasional   OB History    No data available     Review of Systems  Constitutional: Negative for fever and chills.  HENT: Positive for congestion and sore throat.   Respiratory: Negative for shortness of breath.   Cardiovascular: Negative for chest pain.  Gastrointestinal: Negative for nausea, vomiting, abdominal pain and diarrhea.       Reflux    Allergies  Review of patient's allergies indicates no known allergies.  Home Medications   Prior to Admission medications   Medication Sig Start Date End Date Taking? Authorizing Provider  albuterol (PROVENTIL HFA;VENTOLIN HFA) 108 (90 BASE) MCG/ACT inhaler Inhale 2 puffs into the lungs every 6 (six) hours as needed for  wheezing. 09/04/11 09/03/12  Judi Saa, DO  cetirizine (ZYRTEC) 10 MG tablet Take 1 tablet (10 mg total) by mouth daily. 09/04/11 09/03/12  Judi Saa, DO  doxycycline (VIBRAMYCIN) 100 MG capsule Take 1 capsule (100 mg total) by mouth 2 (two) times daily. 05/03/14   Adrian Blackwater Marek Nghiem, PA-C  fexofenadine (ALLEGRA) 180 MG tablet Take 1 tablet (180 mg total) by mouth daily. 05/03/14   Adrian Blackwater Vineet Kinney, PA-C  fluticasone (FLONASE) 50 MCG/ACT nasal spray Place 2 sprays into the nose daily. 09/04/11 09/03/12  Judi Saa, DO  fluticasone (FLONASE) 50 MCG/ACT nasal spray Place 2 sprays into both nostrils 2 (two) times daily. Decrease to 2 sprays/nostril daily after 5 days 05/03/14   Graylon Good, PA-C  Multiple Vitamin (MULTIVITAMIN) tablet Take 1 tablet by mouth daily.    Historical Provider, MD  omeprazole (PRILOSEC) 20 MG capsule Take 1 capsule (20 mg total) by mouth daily. 05/03/14   Graylon Good, PA-C  triamcinolone (KENALOG) 0.025 % ointment Apply topically 2 (two) times daily. 04/15/12   Ardyth Gal, MD   BP 124/79 mmHg  Pulse 74  Temp(Src) 98.8 F (37.1 C) (Oral)  Resp 18  SpO2 100%  LMP 04/21/2014 Physical Exam  Constitutional: She is oriented to person, place, and time. Vital signs are normal. She appears well-developed and well-nourished. No distress.  HENT:  Head: Normocephalic and atraumatic.  Right Ear: External ear normal.  Left Ear: External ear normal.  Nose: Nose normal.  Mouth/Throat: Oropharynx is clear and moist. No oropharyngeal exudate.  Neck: Normal range of motion. Neck supple.  Cardiovascular: Normal rate, regular rhythm and normal heart sounds.   Pulmonary/Chest: Effort normal and breath sounds normal. No respiratory distress.  Abdominal: Soft. There is no tenderness. There is no rebound and no guarding.  Lymphadenopathy:    She has no cervical adenopathy.  Neurological: She is alert and oriented to person, place, and time. She has normal strength.  Coordination normal.  Skin: Skin is warm and dry. No rash noted. She is not diaphoretic.  Psychiatric: She has a normal mood and affect. Judgment normal.  Nursing note and vitals reviewed.   ED Course  Procedures (including critical care time) Labs Review Labs Reviewed  POCT RAPID STREP A ([facility]RG CARE ONLY)    Imaging Review No results found.   MDM   1. Pharyngitis   2. Gastroesophageal reflux disease without esophagitis    Mild sinusitis versus reflux could be causing her symptoms, treat for both. Follow-up when necessary if no improvement in 2 weeks.  Meds ordered this encounter  Medications  . doxycycline (VIBRAMYCIN) 100 MG capsule    Sig: Take 1 capsule (100 mg total) by mouth 2 (two) times daily.    Dispense:  20 capsule    Refill:  0  . fluticasone (FLONASE) 50 MCG/ACT nasal spray    Sig: Place 2 sprays into both nostrils 2 (two) times daily. Decrease to 2 sprays/nostril daily after 5 days    Dispense:  16 g    Refill:  2  . fexofenadine (ALLEGRA) 180 MG tablet    Sig: Take 1 tablet (180 mg total) by mouth daily.    Dispense:  30 tablet    Refill:  2  . omeprazole (PRILOSEC) 20 MG capsule    Sig: Take 1 capsule (20 mg total) by mouth daily.    Dispense:  30 capsule    Refill:  0       Graylon GoodZachary H Jonerik Sliker, PA-C 05/03/14 1907

## 2014-05-03 NOTE — ED Notes (Signed)
C/o  Throat pain x 3 1/2 weeks.  States "a week prior having ear pain and fever".  Pt treated symptoms with otc meds.   Denies vomiting and diarrhea.

## 2014-05-04 LAB — CULTURE, GROUP A STREP

## 2014-05-04 NOTE — ED Notes (Addendum)
Throat culture: Group A strep (S. Pyogenes).  Pt. treated with Doxycycline.  Message sent to Dr. Artis FlockKindl. [name], Joanne Hampton M 05/04/2014 Discussed with Dr. Artis FlockKindl and he said it should be OK. 05/07/2014

## 2014-05-07 ENCOUNTER — Telehealth (HOSPITAL_COMMUNITY): Payer: Self-pay | Admitting: *Deleted

## 2014-05-07 NOTE — ED Notes (Signed)
I called pt. Pt. verified x 2 and given result.  Pt. told she was adequately treated and to finish all of antibiotic.  If not better then get rechecked.  If anyone she exposed gets the same symptoms, should get checked for strep. Pt. voiced understanding. 05/07/2014

## 2014-05-09 NOTE — ED Notes (Addendum)
Pt. called on VM and said she needs a cheaper Rx.  I called pt. back and discussed it with her.  She said she and the pharmacy have tried to call and no one has called back.  I told her that all the providers have left for the night.  I told her to call back in the morning and ask to speak to the nurse.  I told her I only got this message from 0950 this morning. 05/09/2014 Discussed with Dr. Denyse Amassorey and he changed Rx. [facility]  I called pt. and told her where to pick up her Rx. 05/10/2014

## 2014-05-10 ENCOUNTER — Telehealth (HOSPITAL_COMMUNITY): Payer: Self-pay | Admitting: Family Medicine

## 2014-05-10 MED ORDER — AMOXICILLIN 500 MG PO CAPS: 1000.0000 mg | ORAL_CAPSULE | Freq: Two times a day (BID) | ORAL | Status: DC

## 2014-05-10 NOTE — ED Notes (Signed)
Doxycycline is too expensive will switch to amoxicillin for strep throat coverage. Medication called into [facility]. Nurse to call patient back  Rodolph BongEvan S Aariyana Manz, MD 05/10/14 [phone]

## 2014-07-03 ENCOUNTER — Encounter: Payer: Self-pay | Admitting: Family Medicine

## 2014-07-03 ENCOUNTER — Ambulatory Visit (INDEPENDENT_AMBULATORY_CARE_PROVIDER_SITE_OTHER): Payer: BC Managed Care – PPO | Admitting: Family Medicine

## 2014-07-03 VITALS — BP 119/73 | HR 85 | Temp 99.0°F | Ht 64.0 in | Wt 249.9 lb

## 2014-07-03 DIAGNOSIS — R519 Headache, unspecified: Secondary | ICD-10-CM | POA: Insufficient documentation

## 2014-07-03 DIAGNOSIS — R51 Headache: Secondary | ICD-10-CM | POA: Diagnosis not present

## 2014-07-03 MED ORDER — NAPROXEN 500 MG PO TABS: 500.0000 mg | ORAL_TABLET | Freq: Two times a day (BID) | ORAL | Status: DC

## 2014-07-03 MED ORDER — CYCLOBENZAPRINE HCL 10 MG PO TABS: 10.0000 mg | ORAL_TABLET | Freq: Two times a day (BID) | ORAL | Status: DC | PRN

## 2014-07-03 NOTE — Assessment & Plan Note (Signed)
Unknown etiology of headache. No alarm sx on exam today. Normal neuro exam. Tenderness at the left TMJ joint. Possible etiology of TMJ versus rebound headache from NSAID use. Not consistent with tension, cluster or migraine. No temporal arteritis signs and pretty young for that.  - will treat for 2 weeks with QHS flexeril and naproxen BID with meals for TMJ like sx. Discussed red flags with pt today and when to seek emergent care.  - F/U 2 weeks

## 2014-07-03 NOTE — Patient Instructions (Addendum)
Analgesic Rebound Headaches An analgesic rebound headache is a headache that returns after pain medicine (analgesic) that was taken to treat the initial headache wears off. People who suffer from tension, migraine, or cluster headaches are at risk for developing rebound headaches. Any type of primary headache can return as a rebound headache if you regularly take analgesics more than three times a week. If the cycle of rebound headaches continues, they become chronic daily headaches.  CAUSES Analgesics frequently associated with this problem include common over-the-counter medicines like aspirin, ibuprofen, acetaminophen, sinus relief medicines, and other medicines that contain caffeine. Narcotic pain medicines are also a common cause of rebound headaches.  SIGNS AND SYMPTOMS The symptoms of rebound headaches are the same as the symptoms of your initial headache. Symptoms of specific types of headaches include: Tension headache  Pressure around the head.  Dull, aching head pain.  Pain felt over the front and sides of the head.  Tenderness in the muscles of the head, neck and shoulders. Migraine Headache  Pulsing or throbbing pain on one or both sides of the head.  Severe pain that interferes with daily activities.  Pain that is worsened by physical activity.  Nausea, vomiting, or both.  Pain with exposure to bright light, loud noises, or strong smells.  General sensitivity to bright light, loud noises, or strong smells.  Visual changes.  Numbness of one or both arms. Cluster Headaches  Severe pain that begins in or around one eye or temple.  Redness in the eye on the same side as the pain.  Droopy or swollen eyelid.  One-sided head pain.  Nausea.  Runny nose.  Sweaty, pale facial skin.  Restlessness. DIAGNOSIS  Analgesic rebound headaches are diagnosed by reviewing your medical history. This includes the nature of your initial headaches, as well as the type of pain  medicines you have been using to treat your headaches and how often you take them. TREATMENT Discontinuing frequent use of the analgesic medicine will typically reduce the frequency of the rebound episodes. This may initially worsen your headaches but eventually the pain should become more manageable, less frequent, and less severe.  Seeing a headache specialists may helpful. He or she may be able to help you manage your headaches and to make sure there is not another cause of the headaches. Alternative methods of stress relief such as acupuncture, counseling, biofeedback, and massage may also be helpful. Talk with your health care provider about which alternative treatments might be good for you. HOME CARE INSTRUCTIONS Stopping the regular use of pain medicine can be difficult. Follow your health care provider's instructions carefully. Keep all of your appointments. Avoid triggers that are known to cause your primary headaches. SEEK MEDICAL CARE IF: You continue to experience headaches after following your health care provider's recommended treatments. SEEK IMMEDIATE MEDICAL CARE IF:  You develop new headache pain.  You develop headache pain that is different than what you have experienced in the past.  You develop numbness or tingling in your arms or legs.  You develop changes in your speech or vision. MAKE SURE YOU:  Understand these instructions.  Will watch your child's condition.  Will get help right away if your child is not doing well or gets worse. Document Released: 05/30/2003 Document Revised: 07/24/2013 Document Reviewed: 09/22/2012 [facility] Patient Information 2015 [place], [place]. This information is not intended to replace advice given to you by your health care provider. Make sure you discuss any questions you have with your health care  provider.  General Headache Without Cause A headache is pain or discomfort felt around the head or neck area. The specific cause of a  headache may not be found. There are many causes and types of headaches. A few common ones are:  Tension headaches.  Migraine headaches.  Cluster headaches.  Chronic daily headaches. HOME CARE INSTRUCTIONS   Keep all follow-up appointments with your caregiver or any specialist referral.  Only take over-the-counter or prescription medicines for pain or discomfort as directed by your caregiver.  Lie down in a dark, quiet room when you have a headache.  Keep a headache journal to find out what may trigger your migraine headaches. For example, write down:  What you eat and drink.  How much sleep you get.  Any change to your diet or medicines.  Try massage or other relaxation techniques.  Put ice packs or heat on the head and neck. Use these 3 to 4 times per day for 15 to 20 minutes each time, or as needed.  Limit stress.  Sit up straight, and do not tense your muscles.  Quit smoking if you smoke.  Limit alcohol use.  Decrease the amount of caffeine you drink, or stop drinking caffeine.  Eat and sleep on a regular schedule.  Get 7 to 9 hours of sleep, or as recommended by your caregiver.  Keep lights dim if bright lights bother you and make your headaches worse. SEEK MEDICAL CARE IF:   You have problems with the medicines you were prescribed.  Your medicines are not working.  You have a change from the usual headache.  You have nausea or vomiting. SEEK IMMEDIATE MEDICAL CARE IF:   Your headache becomes severe.  You have a fever.  You have a stiff neck.  You have loss of vision.  You have muscular weakness or loss of muscle control.  You start losing your balance or have trouble walking.  You feel faint or pass out.  You have severe symptoms that are different from your first symptoms. MAKE SURE YOU:   Understand these instructions.  Will watch your condition.  Will get help right away if you are not doing well or get worse. Document Released:  03/09/2005 Document Revised: 06/01/2011 Document Reviewed: 03/25/2011 [facility] Patient Information 2015 [place]. This information is not intended to replace advice given to you by your health care provider. Make sure you discuss any questions you have with your health care provider.  Temporomandibular Problems  Temporomandibular joint (TMJ) dysfunction means there are problems with the joint between your jaw and your skull. This is a joint lined by cartilage like other joints in your body but also has a small disc in the joint which keeps the bones from rubbing on each other. These joints are like other joints and can get inflamed (sore) from arthritis and other problems. When this joint gets sore, it can cause headaches and pain in the jaw and the face. CAUSES  Usually the arthritic types of problems are caused by soreness in the joint. Soreness in the joint can also be caused by overuse. This may come from grinding your teeth. It may also come from mis-alignment in the joint. DIAGNOSIS Diagnosis of this condition can often be made by history and exam. Sometimes your caregiver may need X-rays or an MRI scan to determine the exact cause. It may be necessary to see your dentist to determine if your teeth and jaws are lined up correctly. TREATMENT  Most of the  time this problem is not serious; however, sometimes it can persist (become chronic). When this happens medications that will cut down on inflammation (soreness) help. Sometimes a shot of cortisone into the joint will be helpful. If your teeth are not aligned it may help for your dentist to make a splint for your mouth that can help this problem. If no physical problems can be found, the problem may come from tension. If tension is found to be the cause, biofeedback or relaxation techniques may be helpful. HOME CARE INSTRUCTIONS   Later in the day, applications of ice packs may be helpful. Ice can be used in a plastic bag with a towel around  it to prevent frostbite to skin. This may be used about every 2 hours for 20 to 30 minutes, as needed while awake, or as directed by your caregiver.  Only take over-the-counter or prescription medicines for pain, discomfort, or fever as directed by your caregiver.  If physical therapy was prescribed, follow your caregiver's directions.  Wear mouth appliances as directed if they were given. Document Released: 12/02/2000 Document Revised: 06/01/2011 Document Reviewed: 03/11/2008 [facility] Patient Information 2015 [place], [place]. This information is not intended to replace advice given to you by your health care provider. Make sure you discuss any questions you have with your health care provider.  Please make an appointment for 2-3 weeks to discuss how you are doing.

## 2014-07-03 NOTE — Progress Notes (Signed)
   Subjective:    Patient ID: Joanne Hampton, female    DOB: 11/05/1970, 44 y.o.   MRN: [id number]  HPI  Headache: [facility] with a headache that started approximately 24-28 hours prior. Headache is located on the left side of her head, in the temporal and parietal area. Radiates over the top of her head. She has no photophobia, no vision changes, no vomiting nausea fever or chills. She states the pain is flashing and intermittent. She reports she has tried no medications to ease her headache. She states sleeping on that side tended to make it a little bit better, nothing is making it worse. Patient states she's had migraines once in her life, approximately 9 years ago and nothing since. Pain as a 7/10 when it does occur. She reports she did have an upper respiratory infection last week, and still has some coughing and wheezing related to that.  Non smoker Past Medical History  Diagnosis Date  . Bronchitis    No Known Allergies  Review of Systems Per HPI    Objective:   Physical Exam BP 119/73 mmHg  Pulse 85  Temp(Src) 99 F (37.2 C) (Oral)  Ht 5\' 4"  (1.626 m)  Wt 249 lb 14.4 oz (113.354 kg)  BMI 42.87 kg/m2 Gen: NAD. Well developed, well nourished, Obese AAF HEENT: AT. [place]. Bilateral TM visualized and normal in appearance. Bilateral eyes without injections or icterus. MMM. Bilateral nares pale, otherwise normal. Throat without erythema or exudates. No facial sinus tenderness. Left TMJ tenderness, no popping sensation. No temporal artery/left temporal tenderness.  Neck: Supple, trachea midline, no thyroidmegaly, No LAD Neuro/MSK:: Normal gait. PERLA. EOMi. Alert. Oriented. No focal deficits. CN 2-12 intact. MS 5/5 UE/LE. No cervical tenderness. No bony tenderness.     Assessment & Plan:

## 2014-07-12 ENCOUNTER — Encounter: Payer: Self-pay | Admitting: Family Medicine

## 2014-07-24 ENCOUNTER — Ambulatory Visit: Payer: BC Managed Care – PPO | Admitting: Family Medicine

## 2014-10-23 ENCOUNTER — Ambulatory Visit (INDEPENDENT_AMBULATORY_CARE_PROVIDER_SITE_OTHER): Payer: Self-pay | Admitting: Family Medicine

## 2014-10-23 VITALS — BP 130/82 | HR 80 | Temp 98.3°F | Resp 18 | Ht 64.5 in | Wt 265.0 lb

## 2014-10-23 DIAGNOSIS — Z0000 Encounter for general adult medical examination without abnormal findings: Secondary | ICD-10-CM

## 2014-10-23 NOTE — Progress Notes (Signed)
[facility] [address] [place] [address] [phone] [phone]  Date:  10/23/2014   Name:  Joanne Hampton   DOB:  02-Feb-1971   MRN:  [id number]  PCP:  Saralyn Pilar, DO    Chief Complaint: Employment Physical   History of Present Illness:  Joanne Hampton is a 44 y.o. 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   She has a history of AR and uses zyrtec- OW generally in good health  Patient Active Problem List   Diagnosis Date Noted  . Headache 07/03/2014  . Rash 04/15/2012  . Bronchitis 09/04/2011  . Allergic rhinitis, seasonal 09/04/2011  . Bacterial vaginosis 01/21/2011  . Physical exam, annual 06/02/2010  . TRICHOMONAL VULVOVAGINITIS 03/13/2010  . PAP SMEAR, LGSIL, ABNORMAL 03/01/2007  . OBESITY, UNSPECIFIED 01/24/2007  . ANEMIA 01/24/2007  . MIGRAINE, UNSPEC., W/O INTRACTABLE MIGRAINE 05/20/2006    Past Medical History  Diagnosis Date  . Bronchitis     Past Surgical History  Procedure Laterality Date  . Cesarean section  1990    History  Substance Use Topics  . Smoking status: Never Smoker   . Smokeless tobacco: Not on file  . Alcohol Use: Yes     Comment: occasional    Family History  Problem Relation Age of Onset  . Heart failure Mother     No Known Allergies  Medication list has been reviewed and updated.  Current Outpatient Prescriptions on File Prior to Visit  Medication Sig Dispense Refill  . cetirizine (ZYRTEC) 10 MG tablet Take 1 tablet (10 mg total) by mouth daily. 30 tablet 11  . albuterol (PROVENTIL HFA;VENTOLIN HFA) 108 (90 BASE) MCG/ACT inhaler Inhale 2 puffs into the lungs every 6 (six) hours as needed for wheezing. (Patient not taking: Reported on 10/23/2014) 1 Inhaler 0  . amoxicillin (AMOXIL) 500 MG capsule Take 2 capsules (1,000 mg total) by mouth 2 (two) times daily. (Patient not taking: Reported on  10/23/2014) 40 capsule 0  . cyclobenzaprine (FLEXERIL) 10 MG tablet Take 1 tablet (10 mg total) by mouth 2 (two) times daily as needed for muscle spasms. (Patient not taking: Reported on 10/23/2014) 30 tablet 0  . doxycycline (VIBRAMYCIN) 100 MG capsule Take 1 capsule (100 mg total) by mouth 2 (two) times daily. (Patient not taking: Reported on 10/23/2014) 20 capsule 0  . fexofenadine (ALLEGRA) 180 MG tablet Take 1 tablet (180 mg total) by mouth daily. (Patient not taking: Reported on 10/23/2014) 30 tablet 2  . fluticasone (FLONASE) 50 MCG/ACT nasal spray Place 2 sprays into the nose daily. (Patient not taking: Reported on 10/23/2014) 16 g 6  . fluticasone (FLONASE) 50 MCG/ACT nasal spray Place 2 sprays into both nostrils 2 (two) times daily. Decrease to 2 sprays/nostril daily after 5 days (Patient not taking: Reported on 10/23/2014) 16 g 2  . Multiple Vitamin (MULTIVITAMIN) tablet Take 1 tablet by mouth daily.    . naproxen (NAPROSYN) 500 MG tablet Take 1 tablet (500 mg total) by mouth 2 (two) times daily with a meal. (Patient not taking: Reported on 10/23/2014) 30 tablet 0  . omeprazole (PRILOSEC) 20 MG capsule Take 1 capsule (20 mg total) by mouth daily. (Patient not taking: Reported on 10/23/2014) 30 capsule 0  . triamcinolone (KENALOG) 0.025 % ointment Apply topically 2 (two) times daily. (Patient not taking: Reported on 10/23/2014) 30 g 0   No current  facility-administered medications on file prior to visit.    Review of Systems:  As per HPI- otherwise negative.   Physical Examination: Filed Vitals:   10/23/14 1410  BP: 130/82  Pulse: 100  Temp: 98.3 F (36.8 C)  Resp: 18   Filed Vitals:   10/23/14 1410  Height: 5' 4.5" (1.638 m)  Weight: 265 lb (120.203 kg)   Body mass index is 44.8 kg/(m^2). Ideal Body Weight: Weight in (lb) to have BMI = 25: 147.6  GEN: WDWN, NAD, Non-toxic, A & O x 3, obese, looks well HEENT: Atraumatic, Normocephalic. Neck supple. No masses, No LAD.  Bilateral TM wnl,  oropharynx normal.  PEERL,EOMI.   Ears and Nose: No external deformity. CV: RRR, No M/G/R. No JVD. No thrill. No extra heart sounds. PULM: CTA B, no wheezes, crackles, rhonchi. No retractions. No resp. distress. No accessory muscle use. ABD: S, NT, ND. No rebound. No HSM. EXTR: No c/c/e NEURO Normal gait.  PSYCH: Normally interactive. Conversant. Not depressed or anxious appearing.  Calm demeanor.  Normal strength and DTR of all extremities   Assessment and Plan: Physical exam  DOT exam today, 2 year card  Signed Abbe Amsterdam, MD

## 2014-10-23 NOTE — Patient Instructions (Signed)
Good to see you today- you got a 2 year DOT card

## 2015-02-01 ENCOUNTER — Encounter: Payer: Self-pay | Admitting: Family Medicine

## 2015-02-01 ENCOUNTER — Other Ambulatory Visit (HOSPITAL_COMMUNITY)
Admission: RE | Admit: 2015-02-01 | Discharge: 2015-02-01 | Disposition: A | Discharge status: Home / Self Care | Payer: BC Managed Care – PPO | Source: Ambulatory Visit | Attending: Family Medicine | Admitting: Family Medicine

## 2015-02-01 ENCOUNTER — Ambulatory Visit (INDEPENDENT_AMBULATORY_CARE_PROVIDER_SITE_OTHER): Payer: BC Managed Care – PPO | Admitting: Family Medicine

## 2015-02-01 VITALS — BP 143/76 | HR 82 | Temp 98.7°F | Ht 64.0 in | Wt 253.2 lb

## 2015-02-01 DIAGNOSIS — E669 Obesity, unspecified: Secondary | ICD-10-CM

## 2015-02-01 DIAGNOSIS — Z23 Encounter for immunization: Secondary | ICD-10-CM

## 2015-02-01 DIAGNOSIS — R030 Elevated blood-pressure reading, without diagnosis of hypertension: Secondary | ICD-10-CM

## 2015-02-01 DIAGNOSIS — N898 Other specified noninflammatory disorders of vagina: Secondary | ICD-10-CM

## 2015-02-01 DIAGNOSIS — J302 Other seasonal allergic rhinitis: Secondary | ICD-10-CM

## 2015-02-01 DIAGNOSIS — Z01419 Encounter for gynecological examination (general) (routine) without abnormal findings: Secondary | ICD-10-CM | POA: Insufficient documentation

## 2015-02-01 DIAGNOSIS — Z114 Encounter for screening for human immunodeficiency virus [HIV]: Secondary | ICD-10-CM

## 2015-02-01 DIAGNOSIS — Z124 Encounter for screening for malignant neoplasm of cervix: Secondary | ICD-10-CM | POA: Diagnosis not present

## 2015-02-01 DIAGNOSIS — Z1151 Encounter for screening for human papillomavirus (HPV): Secondary | ICD-10-CM | POA: Diagnosis not present

## 2015-02-01 DIAGNOSIS — A5900 Urogenital trichomoniasis, unspecified: Secondary | ICD-10-CM

## 2015-02-01 DIAGNOSIS — A5901 Trichomonal vulvovaginitis: Secondary | ICD-10-CM

## 2015-02-01 DIAGNOSIS — R87810 Cervical high risk human papillomavirus (HPV) DNA test positive: Secondary | ICD-10-CM | POA: Insufficient documentation

## 2015-02-01 DIAGNOSIS — Z0000 Encounter for general adult medical examination without abnormal findings: Secondary | ICD-10-CM

## 2015-02-01 DIAGNOSIS — Z113 Encounter for screening for infections with a predominantly sexual mode of transmission: Secondary | ICD-10-CM

## 2015-02-01 DIAGNOSIS — IMO0001 Reserved for inherently not codable concepts without codable children: Secondary | ICD-10-CM

## 2015-02-01 LAB — BASIC METABOLIC PANEL WITH GFR
BUN: 10 mg/dL (ref 7–25)
CHLORIDE: 102 mmol/L (ref 98–110)
CO2: 26 mmol/L (ref 20–31)
CREATININE: 0.79 mg/dL (ref 0.50–1.10)
Calcium: 9.3 mg/dL (ref 8.6–10.2)
GFR, Est Non African American: >89 mL/min (ref >=60)
Glucose, Bld: 88 mg/dL (ref 65–99)
Potassium: 4.9 mmol/L (ref 3.5–5.3)
SODIUM: 138 mmol/L (ref 135–146)

## 2015-02-01 LAB — POCT WET PREP (WET MOUNT): Clue Cells Wet Prep Whiff POC: NEGATIVE

## 2015-02-01 MED ORDER — FLUTICASONE PROPIONATE 50 MCG/ACT NA SUSP: 2.0000 | Freq: Every day | NASAL | Status: DC

## 2015-02-01 MED ORDER — CETIRIZINE HCL 10 MG PO TABS: 10.0000 mg | ORAL_TABLET | Freq: Every day | ORAL | Status: DC

## 2015-02-01 MED ORDER — METRONIDAZOLE 0.75 % VA GEL: 1.0000 | Freq: Two times a day (BID) | VAGINAL | Status: DC

## 2015-02-01 MED ORDER — METRONIDAZOLE 500 MG PO TABS: 500.0000 mg | ORAL_TABLET | Freq: Two times a day (BID) | ORAL | Status: DC

## 2015-02-01 NOTE — Patient Instructions (Signed)
Thank you for coming in to clinic today.  1. Overall it sounds like you are healthy and doing well 2. As discussed, we will focus on improving healthy lifestyle - Recommend to increase physical activity with walking up to 4 to 5 times weekly or for longer duration such as 30 min - This will help prevent future problems with high blood pressure and diabetes - Today your BP was a little elevated, re-checked it 3. For Well Woman Exam - Breast exam is normal today, check on your own as needed - Mammogram recommended every 2 years - Pap smear done today  Diagnosed Trichomonas on wet prep - This is an STD, and causing your discharge. It is benign and will not cause harm long-term. Easily treated with Metronidazole 500mg  twice daily for 7 days (no alcohol on this medicine it will make you sick). - Recommend no sexual intercourse for 2 weeks once starting this medication, your partner needs to be treated as well, they may go to the Health Dept or their doctor to get medicine  Recommend changing condom brand or type if in future you still have irritation. I dont see any rash or irritation.  Lab tests and pelvic exam tests done. Will call with results in 1-2 weeks.  (For Cholesterol - please schedule this anytime in next 2 to 4 months) - Please schedule a "Lab Only" visit (early morning, 8 to 9:00am) to get your blood work drawn here at our clinic. - You need to be fasting (No food or drink after midnight, and nothing in the morning before your blood draw). - I have already ordered your blood work (orders will be good for few months), so you may schedule this appointment at your convenience. - We can discuss results at next appointment, otherwise our office will contact you with results by phone or with a letter  Please schedule a follow-up appointment with Dr. Althea CharonKaramalegos in 6 months to follow-up weight  If you have any other questions or concerns, please feel free to call the clinic to contact  me. You may also schedule an earlier appointment if necessary.  However, if your symptoms get significantly worse, please go to the Emergency Department to seek immediate medical attention.  Saralyn PilarAlexander Karamalegos, DO [facility]

## 2015-02-01 NOTE — Progress Notes (Signed)
Subjective:    Patient ID: Joanne Hampton, female    DOB: April 23, 1970, 44 y.o.   MRN: [id number]  Joanne Hampton is a 43 y.o. female presenting on 02/01/2015 for Annual Exam  Requests well woman exam.  HPI  WELL WOMAN EXAM / Health Maintenance: - Patient's last menstrual period was 01/25/2015. No current contraception. - Cervical CA Screening - Last pap smear 01/2011, negative for malignancy (no HPV cotesting). History shows Low-grade Squamous Intraepithelial lesion (LSIL) on pap smear in 2008. - Breast CA Screening - Believes that she had a mammogram at age 85, and thinks results were normal (not in chart). Interested in repeat mammogram. Denies any personal or family history of breast CA or biopsy. - Due for HIV screening - Due for TDap (agrees) and Flu shot (declines)  Vaginal Discharge / Irritation: - Reports concerns with vaginal "sensitivity" and irritation. Admits occasional odor and vaginal discharge, seems to come and go. Complaints recently over past few weeks. - Sexual history with 2 recent partners within past few months. Now only one active partner. Vaginal intercourse with condoms (sometimes cause irritation). - Denies prior known STD. Partner without known STD. - Last testing GC/Chlamydia 12/2010 (negative) - Chart review shows positive Trichomonas (2011)  OBESITY, MORBID with BMI >43 - Currently wt 253 lb, down from 265 lb in 3 months, overall stable for past 2 years. Does occasional walking about 20 min daily about 3x weekly (irregularly, no schedule) - No particular diet. Increasing amount of vegetables, salads No prior history of HTN, DM2, HLD. No family history of HTN, DM2, or Hypothyroid.  Currently employed driving a school bus. Now reports more active with work.  Past Medical History  Diagnosis Date  . Bronchitis    Social History   Social History  . Marital Status: Divorced    Spouse Name: N/A  . Number of Children: N/A  . Years of  Education: N/A   Occupational History  . Not on file.   Social History Main Topics  . Smoking status: Never Smoker   . Smokeless tobacco: Not on file  . Alcohol Use: Yes     Comment: occasional  . Drug Use: No  . Sexual Activity: Not on file   Other Topics Concern  . Not on file   Social History Narrative   Family History  Problem Relation Age of Onset  . Heart failure Mother    Current Outpatient Prescriptions on File Prior to Visit  Medication Sig  . Multiple Vitamin (MULTIVITAMIN) tablet Take 1 tablet by mouth daily.   No current facility-administered medications on file prior to visit.    Review of Systems  Constitutional: Negative for fever, chills, diaphoresis, activity change, appetite change, fatigue and unexpected weight change.  HENT: Negative for congestion.   Eyes: Negative for visual disturbance.  Respiratory: Negative for cough and shortness of breath.   Cardiovascular: Negative for chest pain, palpitations and leg swelling.  Gastrointestinal: Negative for nausea, vomiting, abdominal pain, diarrhea and constipation.  Endocrine: Negative for polydipsia, polyphagia and polyuria.  Genitourinary: Positive for vaginal discharge. Negative for dysuria, frequency, hematuria, decreased urine volume, genital sores, vaginal pain, menstrual problem and pelvic pain.  Musculoskeletal: Negative for back pain and arthralgias.  Skin: Negative for rash.  Neurological: Negative for dizziness, weakness, light-headedness, numbness and headaches.  Hematological: Negative for adenopathy.   Per HPI unless specifically indicated above     Objective:    BP 143/76 mmHg  Pulse 82  Temp(Src) 98.7 F (37.1 C) (  Oral)  Ht 5\' 4"  (1.626 m)  Wt 253 lb 3.2 oz (114.851 kg)  BMI 43.44 kg/m2  LMP 01/25/2015  Wt Readings from Last 3 Encounters:  02/01/15 253 lb 3.2 oz (114.851 kg)  10/23/14 265 lb (120.203 kg)  07/03/14 249 lb 14.4 oz (113.354 kg)    Physical Exam  Constitutional:  She is oriented to person, place, and time. She appears well-developed and well-nourished. No distress.  HENT:  Mouth/Throat: Oropharynx is clear and moist.  Eyes: Conjunctivae are normal. Pupils are equal, round, and reactive to light.  Neck: Normal range of motion. Neck supple. No thyromegaly present.  Cardiovascular: Normal rate, regular rhythm, normal heart sounds and intact distal pulses.   No murmur heard. Pulmonary/Chest: Effort normal and breath sounds normal. No respiratory distress. She has no wheezes. She has no rales. Right breast exhibits no inverted nipple, no mass, no nipple discharge, no skin change and no tenderness. Left breast exhibits no inverted nipple, no mass, no nipple discharge, no skin change and no tenderness. Breasts are symmetrical.  Abdominal: Soft. Bowel sounds are normal. She exhibits no distension and no mass. There is no tenderness.  Genitourinary:  Normal external female genitalia with some mild dry skin in groin. Vaginal canal without lesions. Normal appearing cervix downward angle, without lesions or bleeding. Increased thin greenish discharge on exam. Bimanual exam without masses or cervical motion tenderness. Performed pap smear, tolerated well, mild bleeding.  Chaperoned by Maree Erie, CMA  Musculoskeletal: Normal range of motion. She exhibits no edema or tenderness.  Lymphadenopathy:    She has no cervical adenopathy.  Neurological: She is alert and oriented to person, place, and time.  Skin: Skin is warm and dry. No rash noted. She is not diaphoretic.  Nursing note and vitals reviewed.  Results for orders placed or performed in visit on 02/01/15  BASIC METABOLIC PANEL WITH GFR  Result Value Ref Range   Sodium 138 135 - 146 mmol/L   Potassium 4.9 3.5 - 5.3 mmol/L   Chloride 102 98 - 110 mmol/L   CO2 26 20 - 31 mmol/L   Glucose, Bld 88 65 - 99 mg/dL   BUN 10 7 - 25 mg/dL   Creat 8.11 9.14 - 7.82 mg/dL   Calcium 9.3 8.6 - 95.6 mg/dL   GFR, Est  African American >89 >=60 mL/min   GFR, Est Non African American >89 >=60 mL/min  TSH  Result Value Ref Range   TSH 1.775 0.350 - 4.500 uIU/mL  HIV antibody  Result Value Ref Range   HIV 1&2 Ab, 4th Generation NONREACTIVE NONREACTIVE  POCT Wet Prep ([place])  Result Value Ref Range   Source Wet Prep POC VAG    WBC, Wet Prep HPF POC LOADED    Bacteria Wet Prep HPF POC Moderate (A) None, Few, Too numerous to count   Clue Cells Wet Prep HPF POC None None, Too numerous to count   Clue Cells Wet Prep Whiff POC Negative Whiff    Yeast Wet Prep HPF POC None    Trichomonas Wet Prep HPF POC MANY       Assessment & Plan:   Problem List Items Addressed This Visit      Respiratory   Allergic rhinitis, seasonal    Stable, controlled seasonal allergies, worse in fall. Using Flonase and Zyrtec without problems.      Relevant Medications   cetirizine (ZYRTEC) 10 MG tablet   fluticasone (FLONASE) 50 MCG/ACT nasal spray  Other   Elevated BP    Initial BP elevated SBP 160, down to 140 on manual re-check. No dx HTN. Prior with occasional elevated SBP to 140 and one 150 over past 3-4 years.  Plan: 1. Encouraged starting significant lifestyle modifications with walking, dietary changes 2. Check BMET screening - SCr 0.79 with nml GFR >89 3. Can check BP outside office 4. Follow-up 6 months, if still elevated with >140/90 will dx HTN and start therapy      Morbid obesity (HCC)    Morbid obesity with BMI 43, weight overall stable in past 2 years - No specific diet or exercise regimen, some irregular walking up to 3x weekly - No personal/fam history of DM2, HLD, hypothyroidism  Plan: 1. Screening DM with BMET, check glucose - 88 (random) 2. Check TSH - 1.775 (normal), ruled out hypothyroid 3. Encouraged improved lifestyle modifications to help prevent future HTN, DM2 and other complications - goal to start regular walking routine up to 4-5x weekly or at x 3 weekly >30 min 4.  Recommended dietary changes 5. Follow-up as needed, consider nutritionist follow-up 6. Check fasting lipid panel (future order, return for lab only visit)      Physical exam, annual - Primary   Screening for cervical cancer    Asymptomatic. Last pap smear 2012 - negative. Prior history LSIL in 2011  Plan: 1. Check Pap smear today with added HPV-cotesting      Relevant Orders   Cytology - PAP   Trichomonas vaginalis (TV) infection    Confirmed trichomonas on wet prep - Prior history 2011  Plan: 1. Metrogel 0.75% BID x 5 days per patient request - advised that Metronidazole PO is more effective. Will follow-up to see if she agrees to switch to PO or if will try topical therapy first. 2. Advised to avoid sexual activity x 10-14 days after starting treatment. Recommended patient to contact partner to seek testing/treatment to avoid re-infection. Recommend condom use to reduce risk STDs 3. Can change brand of condom avoid irritation      Relevant Medications   metroNIDAZOLE (METROGEL VAGINAL) 0.75 % vaginal gel   Vaginal discharge    Likely from trichomonas on wet prep.      Relevant Orders   POCT Wet Prep [facility]([place]et [place]) (Completed)   Cervicovaginal ancillary only    Other Visit Diagnoses    Screening for HIV (human immunodeficiency virus)        Relevant Orders    HIV antibody (Completed)    Screening for STD (sexually transmitted disease)        Relevant Orders    Cervicovaginal ancillary only    Need for Tdap vaccination        Relevant Orders    Tdap vaccine greater than or equal to 7yo IM (Completed)       Meds ordered this encounter  Medications  . cetirizine (ZYRTEC) 10 MG tablet    Sig: Take 1 tablet (10 mg total) by mouth daily.    Dispense:  30 tablet    Refill:  11  . fluticasone (FLONASE) 50 MCG/ACT nasal spray    Sig: Place 2 sprays into both nostrils daily.    Dispense:  16 g    Refill:  6  . DISCONTD: metroNIDAZOLE (FLAGYL) 500 MG tablet    Sig: Take 1  tablet (500 mg total) by mouth 2 (two) times daily.    Dispense:  14 tablet    Refill:  0  . metroNIDAZOLE (METROGEL  VAGINAL) 0.75 % vaginal gel    Sig: Place 1 Applicatorful vaginally 2 (two) times daily. For 5 days.    Dispense:  70 g    Refill:  0      Follow up plan: Return in about 6 months (around 08/01/2015) for blood pressure, weight.  Saralyn Pilar, DO [facility], PGY-3

## 2015-02-02 DIAGNOSIS — IMO0001 Reserved for inherently not codable concepts without codable children: Secondary | ICD-10-CM | POA: Insufficient documentation

## 2015-02-02 DIAGNOSIS — R030 Elevated blood-pressure reading, without diagnosis of hypertension: Secondary | ICD-10-CM

## 2015-02-02 LAB — TSH: TSH: 1.775 u[IU]/mL (ref 0.350–4.500)

## 2015-02-02 LAB — HIV ANTIBODY (ROUTINE TESTING W REFLEX): HIV: NONREACTIVE

## 2015-02-02 NOTE — Assessment & Plan Note (Signed)
Stable, controlled seasonal allergies, worse in fall. Using Flonase and Zyrtec without problems.

## 2015-02-02 NOTE — Addendum Note (Signed)
Addended by: Smitty CordsKARAMALEGOS, Bradyn Vassey J on: 02/02/2015 05:03 PM   Modules accepted: Medications, SmartSet

## 2015-02-02 NOTE — Assessment & Plan Note (Signed)
Likely from trichomonas on wet prep.

## 2015-02-02 NOTE — Assessment & Plan Note (Signed)
Initial BP elevated SBP 160, down to 140 on manual re-check. No dx HTN. Prior with occasional elevated SBP to 140 and one 150 over past 3-4 years.  Plan: 1. Encouraged starting significant lifestyle modifications with walking, dietary changes 2. Check BMET screening - SCr 0.79 with nml GFR >89 3. Can check BP outside office 4. Follow-up 6 months, if still elevated with >140/90 will dx HTN and start therapy

## 2015-02-02 NOTE — Assessment & Plan Note (Addendum)
Confirmed trichomonas on wet prep - Prior history 2011  Plan: 1. Metrogel 0.75% BID x 5 days per patient request - advised that Metronidazole PO is more effective. Will follow-up to see if she agrees to switch to PO or if will try topical therapy first. >>> update 02/02/15 > patient agreed to change and filled the metronidazole pill rx instead of the metrogel (due to metrogel < 50% cure rate for trich) 2. Advised to avoid sexual activity x 10-14 days after starting treatment. Recommended patient to contact partner to seek testing/treatment to avoid re-infection. Recommend condom use to reduce risk STDs 3. Can change brand of condom avoid irritation

## 2015-02-02 NOTE — Assessment & Plan Note (Addendum)
Morbid obesity with BMI 43, weight overall stable in past 2 years - No specific diet or exercise regimen, some irregular walking up to 3x weekly - No personal/fam history of DM2, HLD, hypothyroidism  Plan: 1. Screening DM with BMET, check glucose - 88 (random) 2. Check TSH - 1.775 (normal), ruled out hypothyroid 3. Encouraged improved lifestyle modifications to help prevent future HTN, DM2 and other complications - goal to start regular walking routine up to 4-5x weekly or at x 3 weekly >30 min 4. Recommended dietary changes 5. Follow-up as needed, consider nutritionist follow-up 6. Check fasting lipid panel (future order, return for lab only visit)

## 2015-02-02 NOTE — Assessment & Plan Note (Signed)
Asymptomatic. Last pap smear 2012 - negative. Prior history LSIL in 2011  Plan: 1. Check Pap smear today with added HPV-cotesting

## 2015-02-04 LAB — CERVICOVAGINAL ANCILLARY ONLY
Chlamydia: NEGATIVE
Neisseria Gonorrhea: NEGATIVE

## 2015-02-05 LAB — CYTOLOGY - PAP

## 2015-02-06 ENCOUNTER — Telehealth: Payer: Self-pay | Admitting: Family Medicine

## 2015-02-06 NOTE — Telephone Encounter (Signed)
Last office visit with me 02/01/15 for annual physical. I have already updated her on all of her lab results from that visit (normal BMET, TSH, glucose), negative HIV screen. She was treated for Trichomonas with Metronidazole PO x 7 days. Only pending results were the Pap Smear.  Pap smear has now resulted 02/06/15. Results show: Negative for any intra-epithelial malignancy (Satisfactory for evaluation, transition zone present but some obscuring inflammation), positive trich (already known), and High Risk HPV DETECTED (on HPV co-testing). She has not had prior HPV testing on last pap 2012. Does have history of LSIL.  [facility] for HPV DNA Typing (for high risk 16, 18) to follow-up this result. If HPV 16,18 is positive then scheduling colposcopy [facility] will be recommended, otherwise if NEGATVIE then recommend repeat pap smear with HPV co-testing again in 1 year.  Attempted to call patient, unable to reach. Will try again this week.  Saralyn PilarAlexander Karamalegos, DO [facility], PGY-3

## 2015-02-18 NOTE — Telephone Encounter (Signed)
UPDATE 11/28. Pap smear results from 02/01/15 resulted after new add-on HPV typing on 11/16 after High Risk HPV detected. New results with "Negative for HPV 16, 18/45". Following ASCCP guidelines and algorithm. Patient >age 46 with negative pap smear but positive HPV >> HPV typing done and negative for HPV 16, 18. Next step repeat Pap Smear with HPV Co-testing in 1 year (01/2016), if HPV is positive again or ASCUS, then next step is colposcopy.  Patient updated on this result and future plans. No additional interventions or anything for her to do at this time. Reassurance given.  Saralyn PilarAlexander Karamalegos, DO [facility], PGY-3

## 2017-05-27 ENCOUNTER — Ambulatory Visit: Payer: BC Managed Care – PPO | Admitting: Family Medicine

## 2017-05-28 ENCOUNTER — Ambulatory Visit: Payer: BC Managed Care – PPO | Admitting: Family Medicine

## 2017-05-28 DIAGNOSIS — J069 Acute upper respiratory infection, unspecified: Secondary | ICD-10-CM | POA: Diagnosis not present

## 2017-05-28 MED ORDER — ALBUTEROL SULFATE HFA 108 (90 BASE) MCG/ACT IN AERS: 2.0000 | INHALATION_SPRAY | Freq: Four times a day (QID) | RESPIRATORY_TRACT | 0 refills | Status: DC | PRN

## 2017-05-28 MED ORDER — FLUTICASONE PROPIONATE 50 MCG/ACT NA SUSP: 2.0000 | Freq: Every day | NASAL | 0 refills | Status: DC

## 2017-05-28 MED ORDER — CETIRIZINE HCL 10 MG PO TABS: 10.0000 mg | ORAL_TABLET | Freq: Every day | ORAL | 0 refills | Status: DC

## 2017-05-28 NOTE — Patient Instructions (Signed)
It was great seeing you today! I am sorry that you have not been feeling well recently but am glad that you have been improving. I believe that you have a viral infection causing your cough and hoarseness. It will take time for your body to fight this off and for these symptoms to go away. I am giving you some recommendations for over the counter cough and congestion relief. I have also sent in a prescription for your albuterol, zyrtec, and flonase in anticipation for upcoming allergies at your request.

## 2017-05-28 NOTE — Assessment & Plan Note (Addendum)
Likely viral URI. Symptoms lasting for one week so will not test for flu as it will not change management. Very unlikely to be bacterial given lack of accompanying symptoms and afebrile. Recommended over the counter meds and gave return precautions. Gave refills for albuterol, flonase, and zyrtec in anticipation of allergy season at patient's request.

## 2017-05-28 NOTE — Progress Notes (Signed)
   HPI 47 year old female who presents with around a one week history of cough, sore throat, and congestion. She states her symptoms initially started as constantly feeling cold all day on 3/1. She then developed a sore throat that night as well as some sinus congestion. She states that the congestion finally cleared but she has since developed around one day of hoarseness.   CC: cough, congestion, sore throat   ROS: Review of Systems  Constitutional: Positive for chills. Negative for fever.  HENT: Positive for congestion and sore throat. Negative for sinus pain.   Eyes: Negative for pain, discharge and redness.  Respiratory: Positive for cough and sputum production.   Cardiovascular: Negative for chest pain.  Gastrointestinal: Negative for abdominal pain, constipation, diarrhea, nausea and vomiting.  Neurological: Negative for dizziness and headaches.    Review of Systems See HPI for ROS.   CC, SH/smoking status, and VS noted  Objective: BP 127/80   Pulse 88   Temp 98.6 F (37 C) (Oral)   Wt 255 lb 6.4 oz (115.8 kg)   SpO2 100%   BMI 43.84 kg/m  Gen: NAD, alert, cooperative, and pleasant. Obese, African American Female resting comfortably. Noticeable hoarseness when talking HEENT: NCAT, EOMI, PERRL CV: RRR, no murmur Resp: CTAB, no wheezes, non-labored Abd: SNTND, BS present, no guarding or organomegaly Ext: No edema, warm Neuro: Alert and oriented, Speech clear, No gross deficits Neck: No cervical lymphadenopathy, range of motion intact  Assessment and plan:  Viral upper respiratory illness Likely viral URI. Symptoms lasting for one week so will not test for flu as it will not change management. Very unlikely to be bacterial given lack of accompanying symptoms and afebrile. Recommended over the counter meds and gave return precautions. Gave refills for albuterol, flonase, and zyrtec in anticipation of allergy season at patient's request.   No orders of the defined  types were placed in this encounter.   Meds ordered this encounter  Medications  . albuterol (PROVENTIL HFA;VENTOLIN HFA) 108 (90 Base) MCG/ACT inhaler    Sig: Inhale 2 puffs into the lungs every 6 (six) hours as needed for wheezing or shortness of breath.    Dispense:  2 Inhaler    Refill:  0  . cetirizine (ZYRTEC) 10 MG tablet    Sig: Take 1 tablet (10 mg total) by mouth daily.    Dispense:  30 tablet    Refill:  0  . fluticasone (FLONASE) 50 MCG/ACT nasal spray    Sig: Place 2 sprays into both nostrils daily.    Dispense:  16 g    Refill:  0     Myrene BuddyJacob Anise Harbin MD PGY-1 Family Medicine Resident 05/28/2017 10:56 AM

## 2017-11-28 ENCOUNTER — Ambulatory Visit (HOSPITAL_COMMUNITY)
Admission: EM | Admit: 2017-11-28 | Discharge: 2017-11-28 | Disposition: A | Discharge status: Other Acute Inpatient Hospital | Payer: BC Managed Care – PPO

## 2017-11-30 ENCOUNTER — Other Ambulatory Visit (HOSPITAL_COMMUNITY)
Admission: RE | Admit: 2017-11-30 | Discharge: 2017-11-30 | Disposition: A | Discharge status: Home / Self Care | Payer: BC Managed Care – PPO | Source: Ambulatory Visit | Attending: Family Medicine | Admitting: Family Medicine

## 2017-11-30 ENCOUNTER — Ambulatory Visit (INDEPENDENT_AMBULATORY_CARE_PROVIDER_SITE_OTHER): Payer: BC Managed Care – PPO | Admitting: Family Medicine

## 2017-11-30 VITALS — BP 142/70 | HR 76 | Temp 98.5°F | Ht 62.0 in | Wt 250.8 lb

## 2017-11-30 DIAGNOSIS — N898 Other specified noninflammatory disorders of vagina: Secondary | ICD-10-CM | POA: Diagnosis not present

## 2017-11-30 DIAGNOSIS — R51 Headache: Secondary | ICD-10-CM | POA: Diagnosis not present

## 2017-11-30 DIAGNOSIS — R519 Headache, unspecified: Secondary | ICD-10-CM

## 2017-11-30 LAB — POCT WET PREP (WET MOUNT): CLUE CELLS WET PREP WHIFF POC: NEGATIVE

## 2017-11-30 MED ORDER — CYCLOBENZAPRINE HCL 10 MG PO TABS: 10.0000 mg | ORAL_TABLET | Freq: Every day | ORAL | 0 refills | Status: DC

## 2017-11-30 MED ORDER — NAPROXEN 500 MG PO TABS: 500.0000 mg | ORAL_TABLET | Freq: Two times a day (BID) | ORAL | 0 refills | Status: DC

## 2017-11-30 NOTE — Assessment & Plan Note (Addendum)
Patient presenting with vaginal discharge.  Skull exam revealing tenderness to palpation of vagina on anterior, posterior, and lateral walls.  No cervical motion tenderness.  Thin white discharge noted on exam.  Will obtain wet prep, GC chlamydia, HIV, RPR to rule out STDs, yeast infection, or BV.  She is agreeable with this plan. Will treat based on results of these tests.  -Follow-up in 2 weeks if no improvement

## 2017-11-30 NOTE — Progress Notes (Signed)
Subjective:    Patient ID: Joanne Hampton, female    DOB: [birth year], 47 y.o.   MRN: [id number]   CC: vaginal discharge & headaches  HPI: Vaginal discharge Presenting with vaginal discharge x4 days.  States that discharge began on Friday and was yellow and "chunky" in appearance.  Discharge is now clear.  Patient does report a "sour" odor as well.  Patient does endorse an associated itching.  Patient stated she had leftover MetroGel at home and use this which helped with itching but discharge continued.  Patient is sexually active with one partner most recent unprotected intercourse was 3 weeks ago.  Denies any chance of pregnancy.  Denies any urinary symptoms of frequency, urgency, dysuria, odor.  Denies any recent antibiotic use.  Denies any history of diabetes.  Headache Patient reports intermittent headaches for 1 and half weeks.  States that 3 years ago she used to have similar headaches at that time she was given Flexeril and naproxen which alleviated headaches for 3 years now.  Patient denies any associated nausea or vomiting.  Denies any photo or phonophobia.  Reports that headaches are in her left side in the temporal region.  Patient denies any thunderclap symptoms and says it is more of a lingering pain.  Patient reports that it is intermittent and comes and goes.  Pain is better with rest.  Ibuprofen is alleviated some pain but does not take away the headache completely.  Patient drinks at least 316 ounce bottles of water a day.  Reports caffeine intake of 32 ounces of tea 4 out of 7 days of the week.  Patient has elevated blood pressure today but states that normally it is well controlled.   Objective:  BP (!) 142/70 (BP Location: Left Arm, Patient Position: Sitting, Cuff Size: Large)   Pulse 76   Temp 98.5 F (36.9 C) (Oral)   Ht 5\' 2"  (1.575 m)   Wt 250 lb 12.8 oz (113.8 kg)   LMP 11/17/2017   SpO2 99%   BMI 45.87 kg/m  Vitals and nursing note reviewed  General: well  nourished, in no acute distress HEENT: normocephalic, PERRL, EOMI, no scleral icterus or conjunctival pallor, no nasal discharge, moist mucous membranes, good dentition without erythema or discharge noted in posterior oropharynx. Clicking on palpation of left TMJ with opening of mouth  Neck: supple, non-tender, without lymphadenopathy Cardiac: RRR, clear S1 and S2, no murmurs, rubs, or gallops Respiratory: clear to auscultation bilaterally, no increased work of breathing Abdomen: soft, nontender, nondistended, no masses or organomegaly. Bowel sounds present Extremities: no edema or cyanosis. Warm, well perfused.   Skin: warm and dry, no rashes noted Neuro: alert and oriented, no focal deficits. CN2-12 intact. Sensation intact bilaterally and in all extremities.  Female genitalia: Vagina: normal appearing vagina with normal color and discharge, no lesions and vaginal tenderness on anterior, posterior, and lateral walls Cervix: cervical motion tenderness absent and copious thin white discharge   Assessment & Plan:    Headache With intermittent headaches for 1/2 weeks now.  States they feel very similar to headaches that occurred 3 years ago.  At that time patient was diagnosed with likely pain related to TMJ and headaches were alleviated with Flexeril and naproxen.  School exam does reveal clicking left TMJ when opening mouth.  Not consistent with tension, cluster, or migraine headaches.  Unlikely temporal arteritis as patient is still young and symptoms are not consistent.  Unlikely subarachnoid hemorrhage as patient is more intermittent and more  of a lingering pain rather than thunderclap.  Will not need imaging at this time. -We will plan to treat with nightly Flexeril and naproxen twice daily with meals for TMJ-like symptoms.  Strict return precautions given. -follow up in 2 weeks if no improvement  Vaginal discharge Patient presenting with vaginal discharge.  Skull exam revealing tenderness  to palpation of vagina on anterior, posterior, and lateral walls.  No cervical motion tenderness.  Thin white discharge noted on exam.  Will obtain wet prep, GC chlamydia, HIV, RPR to rule out STDs, yeast infection, or BV.  She is agreeable with this plan. Will treat based on results of these tests.  -Follow-up in 2 weeks if no improvement    Return in about 2 weeks (around 12/14/2017).   Oralia Manis, DO, PGY-2

## 2017-11-30 NOTE — Patient Instructions (Signed)
It was a pleasure seeing you today.   Today we discussed your vaginal discharge and headache   For your headache: please use naproxen and flexeril. Monitor you  BP at home and if elevated you need to come see your PCP. If headache is not improved in 2 weeks come back in.  For your discharge: I have obtained labs. I will either call or send a letter with results.   Please follow up in 2 weeks if no improvement or sooner if symptoms persist or worsen. Please call the clinic immediately if you have any concerns.   Our clinic's number is [phone]. Please call with questions or concerns.   Please go to the emergency room if you have vision changes, facial drooping, weakness, or worsening headache (worse headache of your life)  Thank you,  Oralia Manis, DO

## 2017-11-30 NOTE — Assessment & Plan Note (Signed)
With intermittent headaches for 1/2 weeks now.  States they feel very similar to headaches that occurred 3 years ago.  At that time patient was diagnosed with likely pain related to TMJ and headaches were alleviated with Flexeril and naproxen.  School exam does reveal clicking left TMJ when opening mouth.  Not consistent with tension, cluster, or migraine headaches.  Unlikely temporal arteritis as patient is still young and symptoms are not consistent.  Unlikely subarachnoid hemorrhage as patient is more intermittent and more of a lingering pain rather than thunderclap.  Will not need imaging at this time. -We will plan to treat with nightly Flexeril and naproxen twice daily with meals for TMJ-like symptoms.  Strict return precautions given. -follow up in 2 weeks if no improvement

## 2017-12-01 ENCOUNTER — Other Ambulatory Visit: Payer: Self-pay | Admitting: Family Medicine

## 2017-12-01 LAB — HIV ANTIBODY (ROUTINE TESTING W REFLEX): HIV Screen 4th Generation wRfx: NONREACTIVE

## 2017-12-01 LAB — CERVICOVAGINAL ANCILLARY ONLY
Chlamydia: NEGATIVE
Neisseria Gonorrhea: NEGATIVE

## 2017-12-01 LAB — RPR: RPR Ser Ql: NONREACTIVE

## 2017-12-01 MED ORDER — METRONIDAZOLE 500 MG PO TABS: 2000.0000 mg | ORAL_TABLET | Freq: Three times a day (TID) | ORAL | 0 refills | Status: DC

## 2017-12-01 MED ORDER — METRONIDAZOLE 500 MG PO TABS: 2000.0000 mg | ORAL_TABLET | Freq: Once | ORAL | 0 refills | Status: AC

## 2018-04-28 ENCOUNTER — Other Ambulatory Visit: Payer: Self-pay

## 2018-04-28 ENCOUNTER — Encounter: Payer: Self-pay | Admitting: Family Medicine

## 2018-04-28 ENCOUNTER — Ambulatory Visit (INDEPENDENT_AMBULATORY_CARE_PROVIDER_SITE_OTHER): Payer: BC Managed Care – PPO | Admitting: Family Medicine

## 2018-04-28 VITALS — BP 110/60 | HR 101 | Temp 98.5°F | Wt 243.4 lb

## 2018-04-28 DIAGNOSIS — J101 Influenza due to other identified influenza virus with other respiratory manifestations: Secondary | ICD-10-CM | POA: Diagnosis not present

## 2018-04-28 DIAGNOSIS — J09X2 Influenza due to identified novel influenza A virus with other respiratory manifestations: Secondary | ICD-10-CM

## 2018-04-28 DIAGNOSIS — R6889 Other general symptoms and signs: Secondary | ICD-10-CM

## 2018-04-28 LAB — POCT INFLUENZA A/B
INFLUENZA B, POC: NEGATIVE
Influenza A, POC: POSITIVE — AB

## 2018-04-28 MED ORDER — IPRATROPIUM BROMIDE 0.02 % IN SOLN
0.5000 mg | Freq: Once | RESPIRATORY_TRACT | Status: AC
  Administered 2018-04-28: 0.5 mg via RESPIRATORY_TRACT

## 2018-04-28 MED ORDER — ALBUTEROL SULFATE (2.5 MG/3ML) 0.083% IN NEBU
2.5000 mg | INHALATION_SOLUTION | Freq: Once | RESPIRATORY_TRACT | Status: AC
  Administered 2018-04-28: 2.5 mg via RESPIRATORY_TRACT

## 2018-04-28 MED ORDER — OSELTAMIVIR PHOSPHATE 75 MG PO CAPS: 75.0000 mg | ORAL_CAPSULE | Freq: Two times a day (BID) | ORAL | 0 refills | Status: AC

## 2018-04-28 NOTE — Progress Notes (Signed)
Subjective:    Patient ID: Robinette Haines, female    DOB: February 21, 1971, 48 y.o.   MRN: [id number]   CC: weak, trouble breathing   HPI: Ms. Gaier is a 48 year old female with a history of morbid obesity, headaches, and anemia presenting with 1 day history of cough (occasional productive-small amounts of thin mucus sometimes yellow), shortness of breath, fatigue, and some dizziness upon standing. Came on suddenly yesterday morning when she woke up. 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 Has tried mucinex and hot showers, helped a little bit. Non-smoker, no history of lung problems with exception of bronchitis 4 years ago with concerns for asthma at that time. Has an albuterol inhaler, has been using twice a day due to breathing. Subjective fever, did not check at home. Lives with grandchildren, grandson recently had a fever and stayed home for 1 day. Denies any associated sore throat, ear pain. Endorses associated runny nose, headaches. Has been laying in bed mostly, eating some. Drinking plenty of water.   Did not take her flu shot this season.     Smoking status reviewed  Review of Systems Per HPI, also denies recent illness, fever, headache, changes in vision, chest pain, shortness of breath, abdominal pain, N/V/D, weakness   Patient Active Problem List   Diagnosis Date Noted  . Influenza A 04/28/2018  . Elevated BP 02/02/2015  . Screening for cervical cancer 02/01/2015  . Trichomonas vaginalis (TV) infection 02/01/2015  . Headache 07/03/2014  . Rash 04/15/2012  . Bronchitis 09/04/2011  . Viral upper respiratory illness 09/04/2011  . Vaginal discharge 01/21/2011  . Physical exam, annual 06/02/2010  . PAP SMEAR, LGSIL, ABNORMAL 03/01/2007  . Morbid obesity (HCC) 01/24/2007  . ANEMIA 01/24/2007  . MIGRAINE, UNSPEC., W/O INTRACTABLE MIGRAINE 05/20/2006     Objective:  BP 110/60   Pulse (!) 101    Temp 98.5 F (36.9 C) (Oral)   Wt 243 lb 6 oz (110.4 kg)   LMP 04/14/2018   SpO2 99%   BMI 44.51 kg/m  Vitals and nursing note reviewed  General: NAD, pleasant HEENT: Oropharynx nonerythematous, no posterior/anterior cervical lymphadenopathy palpated.  TM bilaterally with appropriate light reflex.  nasal mucosa boggy. Cardiac: RRR, normal heart sounds, no murmurs Respiratory: CTAB, good air movement, not speaking in full sentences during anxious state, however afterwards able to speak in full sentences breathing comfortably.  Satting 99% on RA. Abdomen: soft, nontender, nondistended Extremities: no edema or cyanosis. WWP. Skin: warm and dry, no rashes noted Neuro: alert and oriented, no focal deficits Psych: Occasionally anxious   Assessment & Plan:   Influenza A 1 day history of shortness of breath, cough, fatigue, and myalgias with positive influenza A swab.  No fever in the office, however endorse subjective fever at home.  Initially has some concern for her breathing as she endorsed not being able to breath during our visit, however was also quite anxious during that time as she did not know what was wrong with her.  Reassured that her lungs remained clear with good air movement, satting well at rest, and pulse ox remained >95% while walking.  Once patient calmed down, she was able to breathe comfortably and speak in full sentences.  Received a DuoNeb treatment while in the office, minimal difference with this.  Recommended continued hot showers, steam inhalation to help symptoms.  Encouraged her to continue drinking plenty of water and get some rest.  -  Prescribe Tamiflu 75 mg twice daily for 5 days, counseled this will not decrease the severity of her symptoms, hopefully decrease transmission to her grandchildren living at home and reduce course of illness - May use Tylenol/ibuprofen as needed - Provided work note, likely can return to work on Monday if tolerated - Extensively  discussed return to care precautions including but not limited to shortness of breath/difficulty breathing, increased productive cough, no improvement in the next >1 week   Follow-up if symptoms not improving or worsening, additional return to care precautions discussed  Leticia Penna, DO Family Medicine Resident PGY-1

## 2018-04-28 NOTE — Assessment & Plan Note (Signed)
1 day history of shortness of breath, cough, fatigue, and myalgias with positive influenza A swab.  No fever in the office, however endorse subjective fever at home.  Initially has some concern for her breathing as she endorsed not being able to breath during our visit, however was also quite anxious during that time as she did not know what was wrong with her.  Reassured that her lungs remained clear with good air movement, satting well at rest, and pulse ox remained >95% while walking.  Once patient calmed down, she was able to breathe comfortably and speak in full sentences.  Received a DuoNeb treatment while in the office, minimal difference with this.  Recommended continued hot showers, steam inhalation to help symptoms.  Encouraged her to continue drinking plenty of water and get some rest.  - Prescribe Tamiflu 75 mg twice daily for 5 days, counseled this will not decrease the severity of her symptoms, hopefully decrease transmission to her grandchildren living at home and reduce course of illness - May use Tylenol/ibuprofen as needed - Provided work note, likely can return to work on Monday if tolerated - Extensively discussed return to care precautions including but not limited to shortness of breath/difficulty breathing, increased productive cough, no improvement in the next >1 week

## 2018-04-28 NOTE — Progress Notes (Signed)
Walking Pulse OX Start- 94%-101 bpm - Stop 98%- 117 bpm Aquilla Solian, CMA

## 2018-04-28 NOTE — Patient Instructions (Signed)
It was so wonderful meeting you today, I am so sorry that you have not been feeling well.  You tested positive for influenza A, we will start Tamiflu to hopefully reduce the course time and to decrease transmission to your grandbabies at home.  Please make sure you go home and get plenty of rest, drink plenty of fluids, and relax.  Taking a hot shower and steam inhalation will be helpful for your symptoms.  You may also take Tylenol or ibuprofen as needed.  Please return to care if you have difficulty breathing, increased cough with sputum production.

## 2018-09-18 ENCOUNTER — Emergency Department (HOSPITAL_COMMUNITY)
Admission: EM | Admit: 2018-09-18 | Discharge: 2018-09-18 | Disposition: A | Discharge status: Home / Self Care | Payer: BC Managed Care – PPO | Attending: Emergency Medicine | Admitting: Emergency Medicine

## 2018-09-18 ENCOUNTER — Encounter (HOSPITAL_COMMUNITY): Payer: Self-pay

## 2018-09-18 ENCOUNTER — Other Ambulatory Visit: Payer: Self-pay

## 2018-09-18 DIAGNOSIS — Y999 Unspecified external cause status: Secondary | ICD-10-CM | POA: Insufficient documentation

## 2018-09-18 DIAGNOSIS — Y929 Unspecified place or not applicable: Secondary | ICD-10-CM | POA: Diagnosis not present

## 2018-09-18 DIAGNOSIS — T1591XA Foreign body on external eye, part unspecified, right eye, initial encounter: Secondary | ICD-10-CM

## 2018-09-18 DIAGNOSIS — S0501XA Injury of conjunctiva and corneal abrasion without foreign body, right eye, initial encounter: Secondary | ICD-10-CM

## 2018-09-18 DIAGNOSIS — Y9389 Activity, other specified: Secondary | ICD-10-CM | POA: Diagnosis not present

## 2018-09-18 DIAGNOSIS — X58XXXA Exposure to other specified factors, initial encounter: Secondary | ICD-10-CM | POA: Diagnosis not present

## 2018-09-18 DIAGNOSIS — Z79899 Other long term (current) drug therapy: Secondary | ICD-10-CM | POA: Diagnosis not present

## 2018-09-18 DIAGNOSIS — T1501XA Foreign body in cornea, right eye, initial encounter: Secondary | ICD-10-CM | POA: Insufficient documentation

## 2018-09-18 DIAGNOSIS — S058X1A Other injuries of right eye and orbit, initial encounter: Secondary | ICD-10-CM | POA: Insufficient documentation

## 2018-09-18 DIAGNOSIS — S0591XA Unspecified injury of right eye and orbit, initial encounter: Secondary | ICD-10-CM | POA: Diagnosis present

## 2018-09-18 MED ORDER — ERYTHROMYCIN 5 MG/GM OP OINT
TOPICAL_OINTMENT | Freq: Once | OPHTHALMIC | Status: AC
  Administered 2018-09-18: 1 via OPHTHALMIC
  Filled 2018-09-18: qty 3.5

## 2018-09-18 MED ORDER — ERYTHROMYCIN 5 MG/GM OP OINT: TOPICAL_OINTMENT | OPHTHALMIC | 0 refills | Status: DC

## 2018-09-18 MED ORDER — TETRACAINE HCL 0.5 % OP SOLN
1.0000 [drp] | Freq: Once | OPHTHALMIC | Status: AC
  Administered 2018-09-18: 15:00:00 1 [drp] via OPHTHALMIC
  Filled 2018-09-18: qty 4

## 2018-09-18 MED ORDER — FLUORESCEIN SODIUM 1 MG OP STRP
1.0000 | ORAL_STRIP | Freq: Once | OPHTHALMIC | Status: AC
  Administered 2018-09-18: 15:00:00 1 via OPHTHALMIC
  Filled 2018-09-18: qty 1

## 2018-09-18 MED ORDER — HYDROCODONE-ACETAMINOPHEN 5-325 MG PO TABS: 1.0000 | ORAL_TABLET | ORAL | 0 refills | Status: DC | PRN

## 2018-09-18 NOTE — Discharge Instructions (Signed)
Norco as needed as prescribed for pain not controlled with Tylenol. Do not drive if taking Norco. Apply Erythromycin ointment to your eye twice daily. Eye rest as discussed. Follow up with eye doctor, referral given. Return to ER for worsening or concerning symptoms.

## 2018-09-18 NOTE — ED Provider Notes (Signed)
[facility]-EMERGENCY DEPT Provider Note   CSN: [id number] Arrival date & time: 09/18/18  1316    History   Chief Complaint Chief Complaint  Patient presents with  . Eye Problem    HPI Joanne Hampton is a 48 y.o. female.     48 year old female presents with complaint of right eye pain after accidentally putting nail glue drops into her eye.  Patient states that she reached into her purse for her regular eyedrops, felt a stinging in her eye and realized she is nail glue instead of eyedrops.  Patient immediately flushed her eye with water. Reports ongoing discomfort, swelling of her eye lids, slight light sensitivity. Patient is able to see out of the right eye, states everything is blurry. Patient does not wear glasses or contacts. No other complaints or concerns.      Past Medical History:  Diagnosis Date  . Bronchitis     Patient Active Problem List   Diagnosis Date Noted  . Influenza A 04/28/2018  . Elevated BP 02/02/2015  . Screening for cervical cancer 02/01/2015  . Trichomonas vaginalis (TV) infection 02/01/2015  . Headache 07/03/2014  . Rash 04/15/2012  . Bronchitis 09/04/2011  . Viral upper respiratory illness 09/04/2011  . Vaginal discharge 01/21/2011  . Physical exam, annual 06/02/2010  . PAP SMEAR, LGSIL, ABNORMAL 03/01/2007  . Morbid obesity (HCC) 01/24/2007  . ANEMIA 01/24/2007  . MIGRAINE, UNSPEC., W/O INTRACTABLE MIGRAINE 05/20/2006    Past Surgical History:  Procedure Laterality Date  . CESAREAN SECTION  1990     OB History   No obstetric history on file.      Home Medications    Prior to Admission medications   Medication Sig Start Date End Date Taking? Authorizing Provider  albuterol (PROVENTIL HFA;VENTOLIN HFA) 108 (90 Base) MCG/ACT inhaler Inhale 2 puffs into the lungs every 6 (six) hours as needed for wheezing or shortness of breath. 05/28/17   Myrene BuddyFletcher, Jacob, MD  cetirizine (ZYRTEC) 10 MG tablet Take 1 tablet  (10 mg total) by mouth daily. 05/28/17 05/23/18  Myrene BuddyFletcher, Jacob, MD  cyclobenzaprine (FLEXERIL) 10 MG tablet Take 1 tablet (10 mg total) by mouth at bedtime. 11/30/17   Oralia ManisAbraham, Sherin, DO  erythromycin ophthalmic ointment Place a 1/2 inch ribbon of ointment into the lower eyelid. 09/18/18   Jeannie FendMurphy, Amoree Newlon A, PA-C  fluticasone (FLONASE) 50 MCG/ACT nasal spray Place 2 sprays into both nostrils daily. 05/28/17   Myrene BuddyFletcher, Jacob, MD  HYDROcodone-acetaminophen (NORCO/VICODIN) 5-325 MG tablet Take 1 tablet by mouth every 4 (four) hours as needed. 09/18/18   Jeannie FendMurphy, Jobeth Pangilinan A, PA-C  metroNIDAZOLE (METROGEL VAGINAL) 0.75 % vaginal gel Place 1 Applicatorful vaginally 2 (two) times daily. For 5 days. 02/01/15   Smitty CordsKaramalegos, Alexander J, DO  Multiple Vitamin (MULTIVITAMIN) tablet Take 1 tablet by mouth daily.    [provider]  naproxen (NAPROSYN) 500 MG tablet Take 1 tablet (500 mg total) by mouth 2 (two) times daily with a meal. 11/30/17   Oralia ManisAbraham, Sherin, DO    Family History Family History  Problem Relation Age of Onset  . Heart failure Mother     Social History Social History   Tobacco Use  . Smoking status: Never Smoker  . Smokeless tobacco: Never Used  Substance Use Topics  . Alcohol use: Yes    Comment: occasional  . Drug use: No     Allergies   Patient has no known allergies.   Review of Systems Review of Systems  Constitutional: Negative  for fever.  Eyes: Positive for photophobia, pain and visual disturbance.  Skin: Negative for rash and wound.  Allergic/Immunologic: Negative for immunocompromised state.  Neurological: Negative for headaches.  Psychiatric/Behavioral: Negative for confusion.  All other systems reviewed and are negative.    Physical Exam Updated Vital Signs BP (!) 159/92 (BP Location: Right Arm)   Pulse 87   Temp 98.5 F (36.9 C) (Oral)   Resp 16   LMP 09/06/2018 (Approximate)   SpO2 98%   Physical Exam Vitals signs and nursing note reviewed.   Constitutional:      General: She is not in acute distress.    Appearance: She is well-developed. She is not diaphoretic.  HENT:     Head: Normocephalic and atraumatic.  Eyes:     General: Vision grossly intact.        Right eye: Foreign body present.     Extraocular Movements: Extraocular movements intact.     Conjunctiva/sclera:     Right eye: Right conjunctiva is not injected.     Left eye: Left conjunctiva is not injected.     Pupils: Pupils are equal, round, and reactive to light.     Right eye: Corneal abrasion and fluorescein uptake present. Seidel exam negative.      Comments: Mild swelling upper and lower eye lids.   Pulmonary:     Effort: Pulmonary effort is normal.  Skin:    General: Skin is warm and dry.     Findings: No erythema or rash.  Neurological:     Mental Status: She is alert and oriented to person, place, and time.  Psychiatric:        Behavior: Behavior normal.      ED Treatments / Results  Labs (all labs ordered are listed, but only abnormal results are displayed) Labs Reviewed - No data to display  EKG None  Radiology No results found.  Procedures Procedures (including critical care time)  Medications Ordered in ED Medications  tetracaine (PONTOCAINE) 0.5 % ophthalmic solution 1 drop (has no administration in time range)  fluorescein ophthalmic strip 1 strip (has no administration in time range)  erythromycin ophthalmic ointment (has no administration in time range)     Initial Impression / Assessment and Plan / ED Course  I have reviewed the triage vital signs and the nursing notes.  Pertinent labs & imaging results that were available during my care of the patient were reviewed by me and considered in my medical decision making (see chart for details).  Clinical Course as of Sep 18 1438  Sun Sep 18, 2018  1341 Visual Acuity Bilateral Distance: 20/30 R Distance: unable to see any line on the chart L Distance: 20/50   [LM]   1438 47yo female with right eye pain after accidentally putting nail glue in her eye today. On exam, able to open lids, swelling noted to upper and lower eye lids with small area of glue to upper and lower eye lids/lashes. Small area of glue noted to cornea a 6 o'clock position with  corneal abrasion/fluorescence uptake. Recommend erythromycin ointment to eye, patch for comfort, norco as needed as prescribed and ophtho follow up. Advised against trying to remove the glue, this will happen with time and with help from the ointment. Recommend eye rest, return to ER for worsening symptoms.    [LM]    Clinical Course User Index [LM] Jeannie FendMurphy, Malva Diesing A, PA-C      Final Clinical Impressions(s) / ED Diagnoses   Final  diagnoses:  Abrasion of right cornea, initial encounter  Foreign body, eye, right, initial encounter    ED Discharge Orders         Ordered    erythromycin ophthalmic ointment     09/18/18 1434    HYDROcodone-acetaminophen (NORCO/VICODIN) 5-325 MG tablet  Every 4 hours PRN     09/18/18 1435           Tacy Learn, PA-C 09/18/18 1440    Virgel Manifold, MD 09/18/18 1441

## 2018-09-18 NOTE — ED Triage Notes (Signed)
She thought she was placing her usual eye drops in, however, as she placed a drop in her right eye "It stung, and I knew something was wrong". She then discovered that she had placed a drop of super glue in her eye. She tells [place] she has copiously irrigated her eye at home.

## 2018-09-29 ENCOUNTER — Telehealth: Payer: Self-pay | Admitting: Student in an Organized Health Care Education/Training Program

## 2018-09-29 NOTE — Telephone Encounter (Signed)
Pt is calling because she has her [employer] physical done and was told she needed to have a sleep study done. Can we put in a referral for this. jw

## 2018-10-01 ENCOUNTER — Other Ambulatory Visit: Payer: Self-pay | Admitting: Student in an Organized Health Care Education/Training Program

## 2018-10-01 DIAGNOSIS — Z0000 Encounter for general adult medical examination without abnormal findings: Secondary | ICD-10-CM

## 2018-10-01 NOTE — Telephone Encounter (Signed)
I have put in the referral. Thank you

## 2018-10-01 NOTE — Progress Notes (Signed)
Put in referral for patient to receive sleep study for job at [employer].

## 2019-07-07 ENCOUNTER — Ambulatory Visit (INDEPENDENT_AMBULATORY_CARE_PROVIDER_SITE_OTHER): Payer: BC Managed Care – PPO | Admitting: Family Medicine

## 2019-07-07 ENCOUNTER — Encounter: Payer: Self-pay | Admitting: Family Medicine

## 2019-07-07 ENCOUNTER — Other Ambulatory Visit: Payer: Self-pay

## 2019-07-07 VITALS — BP 150/80 | HR 80 | Ht 62.0 in | Wt 257.8 lb

## 2019-07-07 DIAGNOSIS — Z9189 Other specified personal risk factors, not elsewhere classified: Secondary | ICD-10-CM | POA: Diagnosis not present

## 2019-07-07 NOTE — Assessment & Plan Note (Signed)
Given the difficulty with getting an in person sleep study performed and the wide variety of results she got with the once performed we will make a referral to sleep medicine for formal sleep study.  Cannot do these in person.  She has contacted her insurance and may will pay for additional study.

## 2019-07-07 NOTE — Patient Instructions (Addendum)
It was great seeing you today!  I am sorry about all the problems then confusion with your sleep study.  I think this got messed up because of Covid.  An outside company probably did a sleep study which is why I cannot see the results.  Please see me an email with your results of your sleep study.  If your insurance will cover another sleep study I will be happy to order that for you.  If they will not I can use the study you are emailing me to send in the DME for your CPAP machine.

## 2019-07-07 NOTE — Progress Notes (Signed)
   CHIEF COMPLAINT / HPI: 49 year old female who presents for interpretation of her sleep study.  Patient sleep study first order back in July 2020.  Secondary to COVID-19 pandemic she had a lot of trouble getting the study done.  Eventually was able to get a home sleep study performed which showed severe sleep apnea symptoms.  This result occurred after the first 2 home sleep studies were equivocal.  The patient is requesting an in person formal sleep study.  She has contacted her insurance and they will pay for it.  PERTINENT  PMH / PSH: None   OBJECTIVE: BP (!) 150/80   Pulse 80   Ht 5\' 2"  (1.575 m)   Wt 257 lb 12.8 oz (116.9 kg)   SpO2 99%   BMI 47.15 kg/m   Gen: Pleasant 49 year old African-American female, no acute distress CV: Skin warm and dry Resp: No respiratory distress Neuro: Alert and oriented, Speech clear, No gross deficits   ASSESSMENT / PLAN:  At risk for obstructive sleep apnea Given the difficulty with getting an in person sleep study performed and the wide variety of results she got with the once performed we will make a referral to sleep medicine for formal sleep study.  Cannot do these in person.  She has contacted her insurance and may will pay for additional study.     42 MD PGY-3 Family Medicine Resident [facility]

## 2019-07-18 ENCOUNTER — Telehealth: Payer: Self-pay | Admitting: Neurology

## 2019-07-18 NOTE — Telephone Encounter (Signed)
Pt had a prior hst this year. Pt stated "[employer] informed her they would cover for her to do another study in lab". I informed the pt that per Robin sleep lab manager if [employer] denies the repeat study she would be responsible for the cost of study. Pt verbalized understanding and agreed to proceed with scheduling the sleep consult.

## 2019-07-27 ENCOUNTER — Encounter: Payer: Self-pay | Admitting: Neurology

## 2019-07-27 ENCOUNTER — Other Ambulatory Visit: Payer: Self-pay

## 2019-07-27 ENCOUNTER — Ambulatory Visit: Payer: BC Managed Care – PPO | Admitting: Neurology

## 2019-07-27 VITALS — BP 124/60 | HR 91 | Temp 97.9°F | Ht 62.0 in | Wt 258.3 lb

## 2019-07-27 DIAGNOSIS — G4719 Other hypersomnia: Secondary | ICD-10-CM | POA: Diagnosis not present

## 2019-07-27 DIAGNOSIS — R0683 Snoring: Secondary | ICD-10-CM

## 2019-07-27 DIAGNOSIS — R351 Nocturia: Secondary | ICD-10-CM

## 2019-07-27 DIAGNOSIS — Z9189 Other specified personal risk factors, not elsewhere classified: Secondary | ICD-10-CM

## 2019-07-27 DIAGNOSIS — R519 Headache, unspecified: Secondary | ICD-10-CM

## 2019-07-27 DIAGNOSIS — Z6841 Body Mass Index (BMI) 40.0 and over, adult: Secondary | ICD-10-CM

## 2019-07-27 NOTE — Progress Notes (Signed)
Subjective:    Patient ID: Joanne Hampton is a 49 y.o. female.  HPI     Star Age, MD, PhD [facility] [address], [address] [place], [place] [address]  Dear Dr. Kris Mouton,   I saw your patient, Joanne Hampton, plan you can request my sleep clinic today for initial consultation of her sleep disorder, concern for obstructive sleep apnea.  The patient is unaccompanied today.  As you know, Joanne Hampton is a 49 year old right-handed woman with an underlying medical history of allergies, Hx of bronchitis, morbid obesity with a BMI of over 9, who had a recent home sleep test which indicated obstructive sleep apnea.  Prior test results were reviewed today.  She had an out of center sleep test ordered 05/18/2019, AHI was 41.3/h, lowest oxygen saturation 82%. I reviewed your office note from 07/07/2019. Her Epworth sleepiness score is 11/24.  She reports snoring, she has some nocturia, once or twice per average night, has had occasional morning headaches, no family history of sleep apnea.  Her fatigue severity score is 17 out of 63.  She lives with her oldest daughter, she has 4 children, 3 daughters, 1 son, 72 grandchildren.  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  She has a variable sleep schedule depending on her work schedule.  During the school year she has a split schedule.  Generally, she is in bed by 1030 or 11 PM and has a TV on at night, it helps her relax but she turns it off before falling asleep.  She has no pets in the household.  Rise time is generally around 5 AM for school days.  She drives typically till 10 AM and then again between 1:30 PM and 6 PM.  She had a DOT physical recently and was encouraged to seek evaluation for sleep apnea.  She has had some weight fluctuation and sometimes she has swelling around her feet or ankles.  She drinks caffeine  occasionally in the form of tea or soda, not daily.  She is a non-smoker and drinks alcohol on special occasions only.  Her Past Medical History Is Significant For: Past Medical History:  Diagnosis Date  . Bronchitis     Her Past Surgical History Is Significant For: Past Surgical History:  Procedure Laterality Date  . Girard    Her Family History Is Significant For: Family History  Problem Relation Age of Onset  . Heart failure Mother     Her Social History Is Significant For: Social History   Socioeconomic History  . Marital status: Divorced    Spouse name: Not on file  . Number of children: Not on file  . Years of education: Not on file  . Highest education level: Not on file  Occupational History  . Not on file  Tobacco Use  . Smoking status: Never Smoker  . Smokeless tobacco: Never Used  Substance and Sexual Activity  . Alcohol use: Yes    Comment: occasional  . Drug use: No  . Sexual activity: Not on file  Other Topics Concern  . Not on file  Social History Narrative  . Not on file   Social Determinants of Health   Financial Resource Strain:   . Difficulty of Paying Living Expenses:   Food Insecurity:   . Worried About Charity fundraiser in the Last Year:   .  Ran Out of Food in the Last Year:   Transportation Needs:   . Freight forwarder (Medical):   [name] Lack of Transportation (Non-Medical):   Physical Activity:   . Days of Exercise per Week:   . Minutes of Exercise per Session:   Stress:   . Feeling of Stress :   Social Connections:   . Frequency of Communication with Friends and Family:   . Frequency of Social Gatherings with Friends and Family:   . Attends Religious Services:   . Active Member of Clubs or Organizations:   . Attends Banker Meetings:   [name] Marital Status:     Her Allergies Are:  No Known Allergies:   Her Current Medications Are:  Outpatient Encounter Medications as of 07/27/2019  Medication  Sig  . albuterol (PROVENTIL HFA;VENTOLIN HFA) 108 (90 Base) MCG/ACT inhaler Inhale 2 puffs into the lungs every 6 (six) hours as needed for wheezing or shortness of breath.  . cetirizine (ZYRTEC) 10 MG tablet Take 1 tablet (10 mg total) by mouth daily.  . cyclobenzaprine (FLEXERIL) 10 MG tablet Take 1 tablet (10 mg total) by mouth at bedtime.  [name] erythromycin ophthalmic ointment Place a 1/2 inch ribbon of ointment into the lower eyelid.  . fluticasone (FLONASE) 50 MCG/ACT nasal spray Place 2 sprays into both nostrils daily.  [name] HYDROcodone-acetaminophen (NORCO/VICODIN) 5-325 MG tablet Take 1 tablet by mouth every 4 (four) hours as needed.  . metroNIDAZOLE (METROGEL VAGINAL) 0.75 % vaginal gel Place 1 Applicatorful vaginally 2 (two) times daily. For 5 days.  . Multiple Vitamin (MULTIVITAMIN) tablet Take 1 tablet by mouth daily.  . naproxen (NAPROSYN) 500 MG tablet Take 1 tablet (500 mg total) by mouth 2 (two) times daily with a meal.   No facility-administered encounter medications on file as of 07/27/2019.  :   Review of Systems:  Out of a complete 14 point review of systems, all are reviewed and negative with the exception of these symptoms as listed below: Review of Systems  Neurological:       Here for sleep consult. Per referral prior sleep study. Not on cpap.  Pt reports snoring and daytime sleepiness/fatigue is present.  Epworth Sleepiness Scale 0= would never doze 1= slight chance of dozing 2= moderate chance of dozing 3= high chance of dozing  Sitting and reading:2 Watching TV:1 Sitting inactive in a public place (ex. Theater or meeting):1 As a passenger in a car for an hour without a break:3 Lying down to rest in the afternoon:3 Sitting and talking to someone:0 Sitting quietly after lunch (no alcohol):1 In a car, while stopped in traffic:0 Total:11     Objective:  Neurological Exam  Physical Exam Physical Examination:   Vitals:   07/27/19 1535  BP: 124/60  Pulse:  91  Temp: 97.9 F (36.6 C)  SpO2: 98%    General Examination: The patient is a very pleasant 49 y.o. female in no acute distress. She appears well-developed and well-nourished and well groomed.   HEENT: Normocephalic, atraumatic, pupils are equal, round and reactive to light, extraocular tracking is good without limitation to gaze excursion or nystagmus noted. Hearing is grossly intact. Face is symmetric with normal facial animation. Speech is clear with no dysarthria noted. There is no hypophonia. There is no lip, neck/head, jaw or voice tremor. Neck is supple with full range of passive and active motion. There are no carotid bruits on auscultation. Oropharynx exam reveals: mild mouth dryness, good dental hygiene and moderate  airway crowding, due to tonsillar size of 2+, uvula slightly longer, small airway entry.  Mallampati is class II, tongue protrudes centrally in palate elevates symmetrically.  Neck circumference is 15-5/8 inches.  Chest: Clear to auscultation without wheezing, rhonchi or crackles noted.  Heart: S1+S2+0, regular and normal without murmurs, rubs or gallops noted.   Abdomen: Soft, non-tender and non-distended with normal bowel sounds appreciated on auscultation.  Extremities: There is trace pitting edema in the distal lower extremities bilaterally.   Skin: Warm and dry without trophic changes noted.   Musculoskeletal: exam reveals no obvious joint deformities, tenderness or joint swelling or erythema.   Neurologically:  Mental status: The patient is awake, alert and oriented in all 4 spheres. Her immediate and remote memory, attention, language skills and fund of knowledge are appropriate. There is no evidence of aphasia, agnosia, apraxia or anomia. Speech is clear with normal prosody and enunciation. Thought process is linear. Mood is normal and affect is normal.  Cranial nerves II - XII are as described above under HEENT exam.  Motor exam: Normal bulk, strength and tone  is noted. There is no tremor, Romberg is negative. Fine Motor skills and coordination: grossly intact.  Cerebellar testing: No dysmetria or intention tremor. There is no truncal or gait ataxia.  Sensory exam: intact to light touch in the upper and lower extremities.  Gait, station and balance: She stands easily. No veering to one side is noted. No leaning to one side is noted. Posture is age-appropriate and stance is narrow based. Gait shows normal stride length and normal pace. No problems turning are noted. Tandem walk is unremarkable.                Assessment and Plan:   In summary, Joanne Hampton is a very pleasant 49 y.o.-year old female  with an underlying medical history of allergies, Hx of bronchitis, morbid obesity with a BMI of over 45, whose history and physical exam are concerning for obstructive sleep apnea (OSA).  She had a home sleep test in February which indicated severe obstructive sleep apnea, however, prior to that she had 2 home sleep test that were inconclusive.  She is advised that we should try to pursue a laboratory attended sleep study to get further clarification about her sleep disordered breathing.  To that end, I will request a overnight polysomnogram. I had a long chat with the patient about my findings and the diagnosis of OSA, its prognosis and treatment options. We talked about medical treatments, surgical interventions and non-pharmacological approaches. I explained in particular the risks and ramifications of untreated moderate to severe OSA, especially with respect to developing cardiovascular disease down the Road, including congestive heart failure, difficult to treat hypertension, cardiac arrhythmias, or stroke. Even type 2 diabetes has, in part, been linked to untreated OSA. Symptoms of untreated OSA include daytime sleepiness, memory problems, mood irritability and mood disorder such as depression and anxiety, lack of energy, as well as recurrent headaches,  especially morning headaches. We talked about trying to maintain a healthy lifestyle in general, as well as the importance of weight control. We also talked about the importance of good sleep hygiene. I recommended the following at this time: sleep study.  I explained the sleep test procedure to the patient and also outlined possible surgical and non-surgical treatment options of OSA, including the use of a custom-made dental device (which would require a referral to a specialist dentist or oral surgeon), upper airway surgical options (which would  involve a referral to an ENT surgeon). I also explained the CPAP treatment option to the patient, who indicated that she would be willing to try CPAP or autoPAP, if the need arises. I explained the importance of being compliant with PAP treatment, not only for insurance purposes but primarily to improve Her symptoms, and for the patient's long term health benefit, including to reduce Her cardiovascular risks. I answered all her questions today and the patient was in agreement. I plan to see her back after the sleep study is completed and encouraged her to call with any interim questions, concerns, problems or updates.   Thank you very much for allowing me to participate in the care of this nice patient. If I can be of any further assistance to you please do not hesitate to call me at [phone].  Sincerely,   Huston Foley, MD, PhD

## 2019-07-27 NOTE — Patient Instructions (Signed)

## 2019-08-07 ENCOUNTER — Telehealth: Payer: Self-pay

## 2019-08-07 NOTE — Telephone Encounter (Signed)
LVM for pt to call me back to schedule sleep study  

## 2019-09-01 ENCOUNTER — Other Ambulatory Visit: Payer: Self-pay

## 2019-09-01 ENCOUNTER — Ambulatory Visit (INDEPENDENT_AMBULATORY_CARE_PROVIDER_SITE_OTHER): Payer: BC Managed Care – PPO | Admitting: Neurology

## 2019-09-01 DIAGNOSIS — Z6841 Body Mass Index (BMI) 40.0 and over, adult: Secondary | ICD-10-CM

## 2019-09-01 DIAGNOSIS — G4719 Other hypersomnia: Secondary | ICD-10-CM

## 2019-09-01 DIAGNOSIS — G4720 Circadian rhythm sleep disorder, unspecified type: Secondary | ICD-10-CM

## 2019-09-01 DIAGNOSIS — R519 Headache, unspecified: Secondary | ICD-10-CM

## 2019-09-01 DIAGNOSIS — R351 Nocturia: Secondary | ICD-10-CM

## 2019-09-01 DIAGNOSIS — G4761 Periodic limb movement disorder: Secondary | ICD-10-CM

## 2019-09-01 DIAGNOSIS — G4733 Obstructive sleep apnea (adult) (pediatric): Secondary | ICD-10-CM

## 2019-09-01 DIAGNOSIS — R0683 Snoring: Secondary | ICD-10-CM

## 2019-09-01 DIAGNOSIS — Z9189 Other specified personal risk factors, not elsewhere classified: Secondary | ICD-10-CM

## 2019-09-13 ENCOUNTER — Telehealth: Payer: Self-pay

## 2019-09-13 NOTE — Telephone Encounter (Signed)
-----   Message from Huston Foley, MD sent at 09/13/2019  9:22 AM EDT ----- Patient referred by Dr. Primitivo Gauze, seen by me on 07/27/19, diagnostic PSG on 09/01/19.   Please call and notify the patient that the recent sleep study showed moderate to severe obstructive sleep apnea. I recommend treatment for this in the form of CPAP. This will require a repeat sleep study for proper titration and mask fitting and correct monitoring of the oxygen saturations. Please explain to patient. I have placed an order in the chart. Thanks.  Huston Foley, MD, PhD [facility])

## 2019-09-13 NOTE — Progress Notes (Signed)
Patient referred by Dr. Primitivo Gauze, seen by me on 07/27/19, diagnostic PSG on 09/01/19.   Please call and notify the patient that the recent sleep study showed moderate to severe obstructive sleep apnea. I recommend treatment for this in the form of CPAP. This will require a repeat sleep study for proper titration and mask fitting and correct monitoring of the oxygen saturations. Please explain to patient. I have placed an order in the chart. Thanks.  Huston Foley, MD, PhD [facility])

## 2019-09-13 NOTE — Addendum Note (Signed)
Addended by: Huston Foley on: 09/13/2019 09:22 AM   Modules accepted: Orders

## 2019-09-13 NOTE — Procedures (Signed)
PATIENT'S NAME:  Joanne Hampton, Joanne Hampton DOB:      1970-07-12      MR#:    [id number]     DATE OF RECORDING: 09/01/2019 REFERRING M.D.:  Dr. Primitivo Gauze Study Performed:   Baseline Polysomnogram HISTORY: 49 year old woman with a history of allergies, Hx of bronchitis, morbid obesity with a BMI of over 45, who had a recent home sleep test which indicated obstructive sleep apnea. The patient endorsed the Epworth Sleepiness Scale at 11 points. The patient's weight 258 pounds with a height of 62 (inches), resulting in a BMI of 47.5 kg/m2. The patient's neck circumference measured 15.6 inches.  CURRENT MEDICATIONS: Ventolin, Zyrtec, Flexeril, Flonase, Vicodin, Multivitamin, Naprosyn   PROCEDURE:  This is a multichannel digital polysomnogram utilizing the Somnostar 11.2 system.  Electrodes and sensors were applied and monitored per AASM Specifications.   EEG, EOG, Chin and Limb EMG, were sampled at 200 Hz.  ECG, Snore and Nasal Pressure, Thermal Airflow, Respiratory Effort, CPAP Flow and Pressure, Oximetry was sampled at 50 Hz. Digital video and audio were recorded.      BASELINE STUDY  Lights Out was at 22:31 and Lights On at 05:14.  Total recording time (TRT) was 403 minutes, with a total sleep time (TST) of 336.5 minutes.   The patient's sleep latency was 9.5 minutes.  REM latency was 147.5 minutes, which is mildly delayed. The sleep efficiency was 83.5 %.     SLEEP ARCHITECTURE: WASO (Wake after sleep onset) was 57 minutes with minimal to mild sleep fragmentation noted. There were 26.5 minutes in Stage N1, 225.5 minutes Stage N2, 14 minutes Stage N3 and 70.5 minutes in Stage REM.  The percentage of Stage N1 was 7.9%, Stage N2 was 67.%, which is mildly increased, Stage N3 was 4.2% and Stage R (REM sleep) was 21%, which is normal. The arousals were noted as: 53 were spontaneous, 2 were associated with PLMs, 28 were associated with respiratory events.  RESPIRATORY ANALYSIS:  There were a total of 108  respiratory events:  0 obstructive apneas, 0 central apneas and 0 mixed apneas with a total of 0 apneas and an apnea index (AI) of 0 /hour. There were 108 hypopneas with a hypopnea index of 19.3 /hour. The patient also had 0 respiratory event related arousals (RERAs).      The total APNEA/HYPOPNEA INDEX (AHI) was 19.3/hour and the total RESPIRATORY DISTURBANCE INDEX was  19.3 /hour.  51 events occurred in REM sleep and 114 events in NREM. The REM AHI was  43.4 /hour, versus a non-REM AHI of 12.9. The patient spent 37 minutes of total sleep time in the supine position and 300 minutes in non-supine.. The supine AHI was 24.3 versus a non-supine AHI of 18.6.  OXYGEN SATURATION & C02:  The Wake baseline 02 saturation was 98%, with the lowest being 80%. Time spent below 89% saturation equaled 18 minutes.  PERIODIC LIMB MOVEMENTS: The patient had a total of 104 Periodic Limb Movements.  The Periodic Limb Movement (PLM) index was 18.5 and the PLM Arousal index was .4/hour. Audio and video analysis did not show any abnormal or unusual movements, behaviors, phonations or vocalizations. The patient took 2 bathroom breaks. Mild to moderate snoring was noted. The EKG was in keeping with normal sinus rhythm (NSR).  Post-study, the patient indicated that sleep was the same as usual.   IMPRESSION:  1. Obstructive Sleep Apnea (OSA) 2. Periodic Limb Movement Disorder (PLMD) 3. Dysfunctions associated with sleep stages or arousal from sleep  RECOMMENDATIONS:  1. This study demonstrates moderate to severe obstructive sleep apnea, with a total AHI of 19.3/hour, REM AHI of 43.4/hour, supine AHI of 24.3/hour and O2 nadir of 80%. Treatment with positive airway pressure in the form of CPAP is recommended. This will require a full night titration study to optimize therapy. Other treatment options may include avoidance of supine sleep position along with weight loss, upper airway or jaw surgery in selected patients or the  use of an oral appliance in certain patients. ENT evaluation and/or consultation with a maxillofacial surgeon or dentist may be feasible in some instances.    2. Please note that untreated obstructive sleep apnea may carry additional perioperative morbidity. Patients with significant obstructive sleep apnea should receive perioperative PAP therapy and the surgeons and particularly the anesthesiologist should be informed of the diagnosis and the severity of the sleep disordered breathing. 3. Mild PLMs (periodic limb movements of sleep) were noted during this study with no significant arousals; clinical correlation is recommended. PLMs may improve with OSA treatment.  4. This study shows some sleep fragmentation and mildly abnormal sleep stage percentages; these are nonspecific findings and per se do not signify an intrinsic sleep disorder or a cause for the patient's sleep-related symptoms. Causes include (but are not limited to) the first night effect of the sleep study, circadian rhythm disturbances, medication effect or an underlying mood disorder or medical problem.  5. The patient should be cautioned not to drive, work at heights, or operate dangerous or heavy equipment when tired or sleepy. Review and reiteration of good sleep hygiene measures should be pursued with any patient. 6. The patient will be seen in follow-up in the sleep clinic at [facility] for discussion of the test results, symptom and treatment compliance review, further management strategies, etc. The referring provider will be notified of the test results.  I certify that I have reviewed the entire raw data recording prior to the issuance of this report in accordance with the Standards of Accreditation of the American Academy of Sleep Medicine (AASM)   Star Age, MD, PhD Diplomat, American Board of Neurology and Sleep Medicine (Neurology and Sleep Medicine)

## 2019-09-13 NOTE — Telephone Encounter (Signed)
I called pt. I advised pt that Dr. Athar reviewed their sleep study results and found that severe osa was found and recommends that pt be treated with a cpap. Dr. Athar recommends that pt return for a repeat sleep study in order to properly titrate the cpap and ensure a good mask fit. Pt is agreeable to returning for a titration study. I advised pt that our sleep lab will file with pt's insurance and call pt to schedule the sleep study when we hear back from the pt's insurance regarding coverage of this sleep study. Pt verbalized understanding of results. Pt had no questions at this time but was encouraged to call back if questions arise. ° ° °

## 2019-09-23 ENCOUNTER — Other Ambulatory Visit (HOSPITAL_COMMUNITY): Payer: Self-pay

## 2019-09-28 ENCOUNTER — Other Ambulatory Visit: Payer: Self-pay

## 2019-09-28 ENCOUNTER — Ambulatory Visit (INDEPENDENT_AMBULATORY_CARE_PROVIDER_SITE_OTHER): Payer: BC Managed Care – PPO | Admitting: Neurology

## 2019-09-28 DIAGNOSIS — G4719 Other hypersomnia: Secondary | ICD-10-CM

## 2019-09-28 DIAGNOSIS — G4733 Obstructive sleep apnea (adult) (pediatric): Secondary | ICD-10-CM

## 2019-09-28 DIAGNOSIS — R351 Nocturia: Secondary | ICD-10-CM

## 2019-09-28 DIAGNOSIS — R519 Headache, unspecified: Secondary | ICD-10-CM

## 2019-09-28 DIAGNOSIS — G4720 Circadian rhythm sleep disorder, unspecified type: Secondary | ICD-10-CM

## 2019-09-28 DIAGNOSIS — G4761 Periodic limb movement disorder: Secondary | ICD-10-CM

## 2019-10-13 NOTE — Progress Notes (Signed)
Patient referred by Dr. Primitivo Gauze, seen by me on 07/27/19, diagnostic PSG on 09/01/19.  Patient had a CPAP titration study on 09/28/19.  Please call and inform patient that I have entered an order for treatment with positive airway pressure (PAP) treatment for obstructive sleep apnea (OSA). She did well during the latest sleep study with CPAP. We will, therefore, arrange for a machine for home use through a DME (durable medical equipment) company of Her choice; and I will see the patient back in follow-up in about 10 weeks. Please also explain to the patient that I will be looking out for compliance data, which can be downloaded from the machine (stored on an SD card, that is inserted in the machine) or via remote access through a modem, that is built into the machine. At the time of the followup appointment we will discuss sleep study results and how it is going with PAP treatment at home. Please advise patient to bring Her machine at the time of the first FU visit, even though this is cumbersome. Bringing the machine for every visit after that will likely not be needed, but often helps for the first visit to troubleshoot if needed. Please re-enforce the importance of compliance with treatment and the need for [place] to monitor compliance data - often an insurance requirement and actually good feedback for the patient as far as how they are doing.  Also remind patient, that any interim PAP machine or mask issues should be first addressed with the DME company, as they can often help better with technical and mask fit issues. Please ask if patient has a preference regarding DME company.  Please also make sure, the patient has a follow-up appointment with me in about 10 weeks from the setup date, thanks. May see one of our nurse practitioners if needed for proper timing of the FU appointment.  Please fax or rout report to the referring provider. Thanks,   Huston Foley, MD, PhD [facility])

## 2019-10-13 NOTE — Addendum Note (Signed)
Addended by: Huston Foley on: 10/13/2019 11:39 AM   Modules accepted: Orders

## 2019-10-13 NOTE — Procedures (Signed)
S PATIENT'S NAME:  Joanne Hampton, Joanne Hampton DOB:      June 11, 1970      MR#:    [id number]     DATE OF RECORDING: 09/28/2019 REFERRING M.D.:  Dr. Ursula Beath Performed:   CPAP  Titration HISTORY:  49 year old woman with a history of allergies, Hx of bronchitis, morbid obesity with a BMI of over 45, who presents for a full night titration study. Her baseline sleep study from 09/01/19 showed moderate to severe obstructive sleep apnea, with a total AHI of 19.3/hour, REM AHI of 43.4/hour, supine AHI of 24.3/hour and O2 nadir of 80%. The patient endorsed the Epworth Sleepiness Scale at 11 points, BMI of 47.5 kg/m2. The patient's neck circumference measured 15.6 inches.  CURRENT MEDICATIONS: Ventolin, Zyrtec, Flexeril, Flonase, Vicodin, Multivitamin, Naprosyn  PROCEDURE:  This is a multichannel digital polysomnogram utilizing the SomnoStar 11.2 system.  Electrodes and sensors were applied and monitored per AASM Specifications.   EEG, EOG, Chin and Limb EMG, were sampled at 200 Hz.  ECG, Snore and Nasal Pressure, Thermal Airflow, Respiratory Effort, CPAP Flow and Pressure, Oximetry was sampled at 50 Hz. Digital video and audio were recorded.      The patient was fitted with a medium-wide N20 nasal mask. CPAP was initiated at 5 cmH20 with heated humidity per AASM standards and pressure was advanced to 11 cmH20 because of hypopneas, apneas and desaturations.  At a PAP pressure of 11 cmH20, there was a reduction of the AHI to 0/hour, with supine REM sleep achieved and O2 nadir of 92%.   Lights Out was at 22:46 and Lights On at 04:41. Total recording time (TRT) was 355.5 minutes, with a total sleep time (TST) of 311 minutes. The patient's sleep latency was 7 minutes. REM latency was 85.5 minutes, which is normal. The sleep efficiency was 87.5 %.    SLEEP ARCHITECTURE: WASO (Wake after sleep onset) was 31.5 minutes with mild sleep fragmentation noted and one longer period of wakefulness. There were 22.5 minutes in  Stage N1, 218.5 minutes Stage N2, 6.5 minutes Stage N3 and 63.5 minutes in Stage REM.  The percentage of Stage N1 was 7.2%, Stage N2 was 70.3%, which is increased, Stage N3 was 2.1% and Stage R (REM sleep) was 20.4%, which is normal. The arousals were noted as: 19 were spontaneous, 0 were associated with PLMs, 8 were associated with respiratory events.  RESPIRATORY ANALYSIS:  There was a total of 26 respiratory events: 0 obstructive apneas, 0 central apneas and 0 mixed apneas with a total of 0 apneas and an apnea index (AI) of 0 /hour. There were 26 hypopneas with a hypopnea index of 5./hour. The patient also had 0 respiratory event related arousals (RERAs).      The total APNEA/HYPOPNEA INDEX  (AHI) was 5. /hour and the total RESPIRATORY DISTURBANCE INDEX was 5. /hour  8 events occurred in REM sleep and 18 events in NREM. The REM AHI was 7.6 /hour versus a non-REM AHI of 4.4 /hour.  The patient spent 102 minutes of total sleep time in the supine position and 209 minutes in non-supine. The supine AHI was 0.6, versus a non-supine AHI of 7.2.  OXYGEN SATURATION & C02:  The baseline 02 saturation was 96%, with the lowest being 89%. Time spent below 89% saturation equaled 0 minutes.  PERIODIC LIMB MOVEMENTS:  The patient had a total of 0 Periodic Limb Movements. The Periodic Limb Movement (PLM) index was 0 and the PLM Arousal index was 0 /hour.  Audio  and video analysis did not show any abnormal or unusual movements, behaviors, phonations or vocalizations. The patient took no bathroom breaks. The EKG was in keeping with normal sinus rhythm (NSR).  Post-study, the patient indicated that sleep was better than usual.   IMPRESSION:   1. Obstructive Sleep Apnea (OSA) 2. Dysfunctions associated with sleep stages or arousal from sleep   RECOMMENDATIONS:  a 1. This study demonstrates resolution of the patient's obstructive sleep apnea with CPAP therapy. I will, therefore, start the patient on home CPAP  treatment at a pressure of 11 cm via medium-wide ResMed N20 nasal mask with (heated) humidity. The patient should be reminded to be fully compliant with PAP therapy to improve sleep related symptoms and decrease long term cardiovascular risks. The patient should be reminded, that it may take up to 3 months to get fully used to using PAP with all planned sleep. The earlier full compliance is achieved, the better long term compliance tends to be. Please note that untreated obstructive sleep apnea may carry additional perioperative morbidity. Patients with significant obstructive sleep apnea should receive perioperative PAP therapy and the surgeons and particularly the anesthesiologist should be informed of the diagnosis and the severity of the sleep disordered breathing. 2. This study shows some sleep fragmentation and mildly abnormal sleep stage percentages; these are nonspecific findings and per se do not signify an intrinsic sleep disorder or a cause for the patient's sleep-related symptoms. Causes include (but are not limited to) the first night effect of the sleep study, circadian rhythm disturbances, medication effect or an underlying mood disorder or medical problem.  3. The patient should be cautioned not to drive, work at heights, or operate dangerous or heavy equipment when tired or sleepy. Review and reiteration of good sleep hygiene measures should be pursued with any patient. 4. The patient will be seen in follow-up in the sleep clinic at [facility] for discussion of the test results, symptom and treatment compliance review, further management strategies, etc. The referring provider will be notified of the test results.   I certify that I have reviewed the entire raw data recording prior to the issuance of this report in accordance with the Standards of Accreditation of the American Academy of Sleep Medicine (AASM)     Huston Foley, MD, PhD Diplomat, American Board of Neurology and Sleep Medicine  (Neurology and Sleep Medicine)

## 2019-10-16 ENCOUNTER — Telehealth: Payer: Self-pay

## 2019-10-16 NOTE — Telephone Encounter (Signed)
I called pt. No answer, left a message asking pt to call me back.   

## 2019-10-16 NOTE — Telephone Encounter (Signed)
-----   Message from Huston Foley, MD sent at 10/13/2019 11:39 AM EDT ----- Patient referred by Dr. Primitivo Gauze, seen by me on 07/27/19, diagnostic PSG on 09/01/19.  Patient had a CPAP titration study on 09/28/19.  Please call and inform patient that I have entered an order for treatment with positive airway pressure (PAP) treatment for obstructive sleep apnea (OSA). She did well during the latest sleep study with CPAP. We will, therefore, arrange for a machine for home use through a DME (durable medical equipment) company of Her choice; and I will see the patient back in follow-up in about 10 weeks. Please also explain to the patient that I will be looking out for compliance data, which can be downloaded from the machine (stored on an SD card, that is inserted in the machine) or via remote access through a modem, that is built into the machine. At the time of the followup appointment we will discuss sleep study results and how it is going with PAP treatment at home. Please advise patient to bring Her machine at the time of the first FU visit, even though this is cumbersome. Bringing the machine for every visit after that will likely not be needed, but often helps for the first visit to troubleshoot if needed. Please re-enforce the importance of compliance with treatment and the need for [place] to monitor compliance data - often an insurance requirement and actually good feedback for the patient as far as how they are doing.  Also remind patient, that any interim PAP machine or mask issues should be first addressed with the DME company, as they can often help better with technical and mask fit issues. Please ask if patient has a preference regarding DME company.  Please also make sure, the patient has a follow-up appointment with me in about 10 weeks from the setup date, thanks. May see one of our nurse practitioners if needed for proper timing of the FU appointment.  Please fax or rout report to the referring provider.  Thanks,   Huston Foley, MD, PhD [facility])

## 2019-10-17 NOTE — Telephone Encounter (Signed)
I called pt. No answer, left a message asking pt to call me back.   

## 2019-10-18 NOTE — Telephone Encounter (Signed)
LVM for pt to call office back.

## 2019-10-18 NOTE — Telephone Encounter (Signed)
Pt has called back in response to the message from [facility] on yesterday.  Please call

## 2019-10-18 NOTE — Telephone Encounter (Signed)
Pt has returned the call to Emma, RN please call pt back. °

## 2019-10-18 NOTE — Telephone Encounter (Signed)
LVM for pt to call again

## 2019-10-19 ENCOUNTER — Encounter: Payer: Self-pay | Admitting: *Deleted

## 2019-10-19 NOTE — Telephone Encounter (Signed)
Pt called office back. I took call from phone room. Relayed results per Dr. Frances Furbish note. Pt verbalized understanding. Made follow up for 01/11/20 at 3pm. Pt requested Thursday appt. Advised we will mail letter with all this information. Verified address on file. She verbalized understanding.  Sent community message to [employer] that new order placed.

## 2020-01-11 ENCOUNTER — Ambulatory Visit: Payer: Self-pay | Admitting: Neurology

## 2020-01-29 ENCOUNTER — Other Ambulatory Visit: Payer: Self-pay

## 2020-01-29 ENCOUNTER — Ambulatory Visit: Payer: BC Managed Care – PPO | Admitting: Neurology

## 2020-01-29 ENCOUNTER — Encounter: Payer: Self-pay | Admitting: Neurology

## 2020-01-29 VITALS — BP 188/86 | HR 96 | Ht 62.0 in | Wt 250.0 lb

## 2020-01-29 DIAGNOSIS — G4733 Obstructive sleep apnea (adult) (pediatric): Secondary | ICD-10-CM | POA: Diagnosis not present

## 2020-01-29 DIAGNOSIS — Z9989 Dependence on other enabling machines and devices: Secondary | ICD-10-CM | POA: Diagnosis not present

## 2020-01-29 NOTE — Progress Notes (Signed)
Subjective:    Patient ID: Joanne Hampton is a 49 y.o. female.  HPI     Interim history:   Joanne Hampton is a 49 year old right-handed woman with an underlying medical history of allergies, Hx of bronchitis, morbid obesity with a BMI of over 24, who Presents for follow-up consultation of her obstructive sleep apnea after recent testing and starting CPAP therapy.  The patient is unaccompanied today.  I first met her at the request of her primary care physician on 07/27/2019, at which time she reported a recent diagnosis of obstructive sleep apnea after a home sleep test in February 2021.  She was advised to proceed with a nocturnal polysomnogram.  She had a baseline sleep study on 09/01/2019, followed by a CPAP titration study.  Her baseline sleep study from 09/01/2019 showed a sleep latency of 9.5 minutes, REM latency mildly delayed at 147.5 minutes and sleep efficiency 83.5%.  She had a normal percentage of REM sleep.  Total AHI was in the moderate range at 19.3/h, severe in REM sleep at 43.4/h, supine AHI was also in the moderate range at 24.3/h.  Average oxygen saturation was 98%, nadir was 80%, time below 89% saturation was 18 minutes.  She had mild PLM's with no significant arousals.  She was advised to return for a CPAP titration study.  She had this on 09/28/2019.  Sleep efficiency was 87.5%, sleep latency 7 minutes, REM latency normal at 85.5 minutes, normal percentage of REM sleep at 20.4%.  She was fitted with a nasal mask and CPAP was titrated from 5cm to 11 cm.  On the final pressure her AHI was 0/h with supine REM sleep achieved and O2 nadir of 92%.  Based on her sleep study results from her titration she was advised to start CPAP therapy at home.  Today, 01/29/20: I reviewed her CPAP compliance data from 12/25/2019 through 01/23/2020, which is a total of 30 days, during which time she used her machine every night with percent use days greater than 4 hours at 97%, indicating excellent  compliance with an average usage of 6 hours and 42 minutes, residual AHI at goal at 2.2/h, leak on the low side with a 95th percentile at 6.2 L/min on a pressure of 11 cm with EPR of 3.  She reports that she has adjusted well to treatment.  She is compliant with her CPAP and endorses improvement in her sleep quality, sleep consolidation and daytime energy.  Her nocturia is also less.  Thus far, she is very pleased with how she is doing and motivated to continue with treatment.  She is using a nasal mask.  Her preferred sleep position would be stomach sleeping but she has adjusted to a side sleeping position fairly well.  The patient's allergies, current medications, family history, past medical history, past social history, past surgical history and problem list were reviewed and updated as appropriate.   Previously:   07/27/19: (She) had a recent home sleep test which indicated obstructive sleep apnea.  Prior test results were reviewed today.  She had an out of center sleep test ordered 05/18/2019, AHI was 41.3/h, lowest oxygen saturation 82%. I reviewed your office note from 07/07/2019. Her Epworth sleepiness score is 11/24.  She reports snoring, she has some nocturia, once or twice per average night, has had occasional morning headaches, no family history of sleep apnea.  Her fatigue severity score is 17 out of 63.  She lives with her oldest daughter, she has 4 children, 3  daughters, 1 son, 63 grandchildren.  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  She has a variable sleep schedule depending on her work schedule.  During the school year she has a split schedule.  Generally, she is in bed by 1030 or 11 PM and has a TV on at night, it helps her relax but she turns it off before falling asleep.  She has no pets in the household.  Rise time is generally around 5 AM for school days.  She drives typically till 10 AM and then again  between 1:30 PM and 6 PM.  She had a DOT physical recently and was encouraged to seek evaluation for sleep apnea.  She has had some weight fluctuation and sometimes she has swelling around her feet or ankles.  She drinks caffeine occasionally in the form of tea or soda, not daily.  She is a non-smoker and drinks alcohol on special occasions only.  Her Past Medical History Is Significant For: Past Medical History:  Diagnosis Date  . Bronchitis     Her Past Surgical History Is Significant For: Past Surgical History:  Procedure Laterality Date  . [place]    Her Family History Is Significant For: Family History  Problem Relation Age of Onset  . Heart failure Mother     Her Social History Is Significant For: Social History   Socioeconomic History  . Marital status: Divorced    Spouse name: Not on file  . Number of children: Not on file  . Years of education: Not on file  . Highest education level: Not on file  Occupational History  . Not on file  Tobacco Use  . Smoking status: Never Smoker  . Smokeless tobacco: Never Used  Substance and Sexual Activity  . Alcohol use: Yes    Comment: occasional  . Drug use: No  . Sexual activity: Not on file  Other Topics Concern  . Not on file  Social History Narrative  . Not on file   Social Determinants of Health   Financial Resource Strain:   . Difficulty of Paying Living Expenses: Not on file  Food Insecurity:   . Worried About Charity fundraiser in the Last Year: Not on file  . Ran Out of Food in the Last Year: Not on file  Transportation Needs:   . Lack of Transportation (Medical): Not on file  . Lack of Transportation (Non-Medical): Not on file  Physical Activity:   . Days of Exercise per Week: Not on file  . Minutes of Exercise per Session: Not on file  Stress:   . Feeling of Stress : Not on file  Social Connections:   . Frequency of Communication with Friends and Family: Not on file  . Frequency of  Social Gatherings with Friends and Family: Not on file  . Attends Religious Services: Not on file  . Active Member of Clubs or Organizations: Not on file  . Attends Archivist Meetings: Not on file  . Marital Status: Not on file    Her Allergies Are:  No Known Allergies:   Her Current Medications Are:  Outpatient Encounter Medications as of 01/29/2020  Medication Sig  . albuterol (PROVENTIL HFA;VENTOLIN HFA) 108 (90 Base) MCG/ACT inhaler Inhale 2 puffs into the lungs every 6 (six) hours as needed for wheezing or shortness of breath.  . Multiple Vitamin (MULTIVITAMIN)  tablet Take 1 tablet by mouth daily.  . [DISCONTINUED] cyclobenzaprine (FLEXERIL) 10 MG tablet Take 1 tablet (10 mg total) by mouth at bedtime.  . [DISCONTINUED] erythromycin ophthalmic ointment Place a 1/2 inch ribbon of ointment into the lower eyelid.  . [DISCONTINUED] fluticasone (FLONASE) 50 MCG/ACT nasal spray Place 2 sprays into both nostrils daily.  . [DISCONTINUED] HYDROcodone-acetaminophen (NORCO/VICODIN) 5-325 MG tablet Take 1 tablet by mouth every 4 (four) hours as needed.  . [DISCONTINUED] metroNIDAZOLE (METROGEL VAGINAL) 0.75 % vaginal gel Place 1 Applicatorful vaginally 2 (two) times daily. For 5 days.  . [DISCONTINUED] naproxen (NAPROSYN) 500 MG tablet Take 1 tablet (500 mg total) by mouth 2 (two) times daily with a meal.  . cetirizine (ZYRTEC) 10 MG tablet Take 1 tablet (10 mg total) by mouth daily.   No facility-administered encounter medications on file as of 01/29/2020.  :  Review of Systems:  Out of a complete 14 point review of systems, all are reviewed and negative with the exception of these symptoms as listed below:  Review of Systems  Neurological:       Pt presents today to discuss her cpap. Pt reports that she feels better on PAP therapy.    Objective:  Neurological Exam  Physical Exam Physical Examination:   Vitals:   01/29/20 1350  BP: (!) 188/86  Pulse: 96   General  Examination: The patient is a very pleasant 49 y.o. female in no acute distress. She appears well-developed and well-nourished and well groomed.   HEENT: Normocephalic, atraumatic, pupils are equal, round and reactive to light, extraocular tracking is good without limitation to gaze excursion or nystagmus noted. Hearing is grossly intact. Face is symmetric with normal facial animation. Speech is clear with no dysarthria noted. There is no hypophonia. There is no lip, neck/head, jaw or voice tremor. Neck with FROM. There are no carotid bruits on auscultation. Oropharynx exam reveals: mild mouth dryness, good dental hygiene and moderate airway crowding. Tongue protrudes centrally and palate elevates symmetrically.   Chest: Clear to auscultation without wheezing, rhonchi or crackles noted.  Heart: S1+S2+0, regular with possible, 1/6 systolic murmur.   Abdomen: Soft, non-tender and non-distended with normal bowel sounds appreciated on auscultation.  Extremities: There is no pitting edema in the distal lower extremities bilaterally.   Skin: Warm and dry without trophic changes noted.   Musculoskeletal: exam reveals no obvious joint deformities, tenderness or joint swelling or erythema.   Neurologically:  Mental status: The patient is awake, alert and oriented in all 4 spheres. Her immediate and remote memory, attention, language skills and fund of knowledge are appropriate. There is no evidence of aphasia, agnosia, apraxia or anomia. Speech is clear with normal prosody and enunciation. Thought process is linear. Mood is normal and affect is normal.  Cranial nerves II - XII are as described above under HEENT exam.  Motor exam: Normal bulk, strength and tone is noted. There is no tremor, Romberg is negative. Fine Motor skills and coordination: grossly intact.  Cerebellar testing: No dysmetria or intention tremor. There is no truncal or gait ataxia.  Sensory exam: intact to light touch in the  upper and lower extremities.  Gait, station and balance: She stands easily. No veering to one side is noted. No leaning to one side is noted. Posture is age-appropriate and stance is narrow based. Gait shows normal stride length and normal pace. No problems turning are noted. Tandem walk is unremarkable.  Assessment and Plan:   In summary, Joanne Hampton is a very pleasant 49 year old female  with an underlying medical history of allergies, Hx of bronchitis, morbid obesity with a BMI of over 1, who presents for follow up consultation of her OSA, after interim sleep testing.  Her baseline sleep study showed moderate to severe obstructive sleep apnea in June 2021.  She did well during her CPAP titration study in July 2021.  She has established treatment with CPAP therapy at 11 cm via nasal mask.  She is compliant with treatment and has benefited from it.  Is commended for her treatment adherence.  She has a compliance for over 4 hours at 97% which is excellent.  Average usage is 6 hours and 42 minutes.  She is encouraged to continue with treatment and continue to work on weight loss.  She is feeling better with regards to sleep consolidation, sleep quality and is highly motivated to continue with treatment.  She is advised to follow-up routinely to see one of our nurse practitioners in 1 year.  I answered all her questions today and she was in agreement.  We reviewed her sleep study results together in detail as well as her compliance data.  If she needs a letter of support stating that she is compliant and benefits from CPAP therapy for her DOT physical, I would be happy to provide a separate letter.   I spent 30 minutes in total face-to-face time and in reviewing records during pre-charting, more than 50% of which was spent in counseling and coordination of care, reviewing test results, reviewing medications and treatment regimen and/or in discussing or reviewing the diagnosis of OSA, the  prognosis and treatment options. Pertinent laboratory and imaging test results that were available during this visit with the patient were reviewed by me and considered in my medical decision making (see chart for details).

## 2020-01-29 NOTE — Patient Instructions (Addendum)
You are fully compliant with your CPAP, and I am glad to hear you are feeling better with regards to your sleep quality, sleep consolidation and daytime energy.   Please continue using your CPAP regularly. While your insurance requires that you use CPAP at least 4 hours each night on 70% of the nights, I recommend, that you not skip any nights and use it throughout the night if you can. Getting used to CPAP and staying with the treatment long term does take time and patience and discipline. Untreated obstructive sleep apnea when it is moderate to severe can have an adverse impact on cardiovascular health and raise her risk for heart disease, arrhythmias, hypertension, congestive heart failure, stroke and diabetes. Untreated obstructive sleep apnea causes sleep disruption, nonrestorative sleep, and sleep deprivation. This can have an impact on your day to day functioning and cause daytime sleepiness and impairment of cognitive function, memory loss, mood disturbance, and problems focussing. Using CPAP regularly can improve these symptoms.  Keep up the good work! We can see you in 1 year, you can see one of our nurse practitioners as you are stable.   I noticed a faint heart murmur. Please be sure to mention it to you your medical doctor at the next visit.

## 2020-03-11 ENCOUNTER — Encounter: Payer: Self-pay | Admitting: Student in an Organized Health Care Education/Training Program

## 2020-03-11 ENCOUNTER — Other Ambulatory Visit: Payer: Self-pay

## 2020-03-11 ENCOUNTER — Ambulatory Visit (INDEPENDENT_AMBULATORY_CARE_PROVIDER_SITE_OTHER): Payer: BC Managed Care – PPO | Admitting: Student in an Organized Health Care Education/Training Program

## 2020-03-11 DIAGNOSIS — I10 Essential (primary) hypertension: Secondary | ICD-10-CM | POA: Diagnosis not present

## 2020-03-11 DIAGNOSIS — J069 Acute upper respiratory infection, unspecified: Secondary | ICD-10-CM | POA: Diagnosis not present

## 2020-03-11 DIAGNOSIS — Z0000 Encounter for general adult medical examination without abnormal findings: Secondary | ICD-10-CM

## 2020-03-11 MED ORDER — CETIRIZINE HCL 10 MG PO TABS: 10.0000 mg | ORAL_TABLET | Freq: Every day | ORAL | 1 refills | Status: DC

## 2020-03-11 MED ORDER — ALBUTEROL SULFATE HFA 108 (90 BASE) MCG/ACT IN AERS: 2.0000 | INHALATION_SPRAY | Freq: Four times a day (QID) | RESPIRATORY_TRACT | 1 refills | Status: DC | PRN

## 2020-03-11 NOTE — Progress Notes (Signed)
   SUBJECTIVE:   CHIEF COMPLAINT / HPI:  Medications refill  Requesting refills of Albuterol and cetirizine. Takes albuterol every morning and no nighttime symptoms.    BP- patient denies HTN symptoms. Strong family history of HTN. Has not been on medications for BP before. Thinks that her decreased activity level, diet are causing her BP to be elevated today as well as driving from [place] for this appointment. She ate popcorn on the way and thinks the salt is what increased her BP.   OBJECTIVE:   BP (!) 172/80   Pulse (!) 101   Wt 255 lb 12.8 oz (116 kg)   SpO2 99%   BMI 46.79 kg/m   170/80 at recheck by provider ~20 minutes later General: NAD, pleasant, able to participate in exam Cardiac: RRR, normal heart sounds, no murmurs. 2+ radial and PT pulses bilaterally Respiratory: CTAB, normal effort, No wheezes, rales or rhonchi Extremities: no edema. WWP. Skin: warm and dry, no rashes noted Neuro: alert and oriented, no focal deficits Psych: Normal affect and mood  ASSESSMENT/PLAN:   Hypertension Patient has had multiple elevated office readings over several years. Meeting criteria for type II HTN.  Today and last office visit are markedly increased and patient is asymptomatic.  170/80 on repeat after resting in room.  Had in depth discussion and education with patient on BP causes, consequences and treatments.  Patient is open to medication if needed in the future but does not want any today. She would like to implement lifestyle changes first to see if she can lower it naturally.  - recommended weight loss, diet changes and increased activity as outlined in AVS - also provided informational handout for losartan which would be a good first line agent for her - follow up in 6 weeks. Obtain BMP and start medication if still markedly elevated.   Healthcare maintenance Also due for colonoscopy, mammogram, pap smear     Leeroy Bock, DO [facility]

## 2020-03-11 NOTE — Patient Instructions (Signed)
It was a pleasure to see you today!  To summarize our discussion for this visit:  Your blood pressure is very high today and at several other visits previously. Some of this has to do with your lifestyle and diet but a lot of it is genetics as well. I recommend we start a medication to manage this at this time but understand that you want to try lifestyle changes first so we talked about some changes to make and will meet up again in 6 weeks to reassess.   More information to read over is below.  Some additional health maintenance measures we should update are: Health Maintenance Due  Topic Date Due   Hepatitis C Screening  Never done   COVID-19 Vaccine (1) Never done   PAP SMEAR-Modifier  01/31/2018   MAMMOGRAM  Never done   COLONOSCOPY  Never done   INFLUENZA VACCINE  10/22/2019      Please return to our clinic to see me in 6 weeks.  Call the clinic at [phone] if your symptoms worsen or you have any concerns.   Thank you for allowing me to take part in your care,  Dr. Jamelle Rushing  Managing Your Hypertension Hypertension is commonly called high blood pressure. This is when the force of your blood pressing against the walls of your arteries is too strong. Arteries are blood vessels that carry blood from your heart throughout your body. Hypertension forces the heart to work harder to pump blood, and may cause the arteries to become narrow or stiff. Having untreated or uncontrolled hypertension can cause heart attack, stroke, kidney disease, and other problems. What are blood pressure readings? A blood pressure reading consists of a higher number over a lower number. Ideally, your blood pressure should be below 120/80. The first ("top") number is called the systolic pressure. It is a measure of the pressure in your arteries as your heart beats. The second ("bottom") number is called the diastolic pressure. It is a measure of the pressure in your arteries as the heart  relaxes. What does my blood pressure reading mean? Blood pressure is classified into four stages. Based on your blood pressure reading, your health care provider may use the following stages to determine what type of treatment you need, if any. Systolic pressure and diastolic pressure are measured in a unit called mm Hg. Normal  Systolic pressure: below 120.  Diastolic pressure: below 80. Elevated  Systolic pressure: 120-129.  Diastolic pressure: below 80. Hypertension stage 1  Systolic pressure: 130-139.  Diastolic pressure: 80-89. Hypertension stage 2  Systolic pressure: 140 or above.  Diastolic pressure: 90 or above. What health risks are associated with hypertension? Managing your hypertension is an important responsibility. Uncontrolled hypertension can lead to:  A heart attack.  A stroke.  A weakened blood vessel (aneurysm).  Heart failure.  Kidney damage.  Eye damage.  Metabolic syndrome.  Memory and concentration problems. What changes can I make to manage my hypertension? Hypertension can be managed by making lifestyle changes and possibly by taking medicines. Your health care provider will help you make a plan to bring your blood pressure within a normal range. Eating and drinking   Eat a diet that is high in fiber and potassium, and low in salt (sodium), added sugar, and fat. An example eating plan is called the DASH (Dietary Approaches to Stop Hypertension) diet. To eat this way: ? Eat plenty of fresh fruits and vegetables. Try to fill half of your plate at  each meal with fruits and vegetables. ? Eat whole grains, such as whole wheat pasta, brown rice, or whole grain bread. Fill about one quarter of your plate with whole grains. ? Eat low-fat diary products. ? Avoid fatty cuts of meat, processed or cured meats, and poultry with skin. Fill about one quarter of your plate with lean proteins such as fish, chicken without skin, beans, eggs, and tofu. ? Avoid  premade and processed foods. These tend to be higher in sodium, added sugar, and fat.  Reduce your daily sodium intake. Most people with hypertension should eat less than 1,500 mg of sodium a day.  Limit alcohol intake to no more than 1 drink a day for nonpregnant women and 2 drinks a day for men. One drink equals 12 oz of beer, 5 oz of wine, or 1 oz of hard liquor. Lifestyle  Work with your health care provider to maintain a healthy body weight, or to lose weight. Ask what an ideal weight is for you.  Get at least 30 minutes of exercise that causes your heart to beat faster (aerobic exercise) most days of the week. Activities may include walking, swimming, or biking.  Include exercise to strengthen your muscles (resistance exercise), such as weight lifting, as part of your weekly exercise routine. Try to do these types of exercises for 30 minutes at least 3 days a week.  Do not use any products that contain nicotine or tobacco, such as cigarettes and e-cigarettes. If you need help quitting, ask your health care provider.  Control any long-term (chronic) conditions you have, such as high cholesterol or diabetes. Monitoring  Monitor your blood pressure at home as told by your health care provider. Your personal target blood pressure may vary depending on your medical conditions, your age, and other factors.  Have your blood pressure checked regularly, as often as told by your health care provider. Working with your health care provider  Review all the medicines you take with your health care provider because there may be side effects or interactions.  Talk with your health care provider about your diet, exercise habits, and other lifestyle factors that may be contributing to hypertension.  Visit your health care provider regularly. Your health care provider can help you create and adjust your plan for managing hypertension. Will I need medicine to control my blood pressure? Your health  care provider may prescribe medicine if lifestyle changes are not enough to get your blood pressure under control, and if:  Your systolic blood pressure is 130 or higher.  Your diastolic blood pressure is 80 or higher. Take medicines only as told by your health care provider. Follow the directions carefully. Blood pressure medicines must be taken as prescribed. The medicine does not work as well when you skip doses. Skipping doses also puts you at risk for problems. Contact a health care provider if:  You think you are having a reaction to medicines you have taken.  You have repeated (recurrent) headaches.  You feel dizzy.  You have swelling in your ankles.  You have trouble with your vision. Get help right away if:  You develop a severe headache or confusion.  You have unusual weakness or numbness, or you feel faint.  You have severe pain in your chest or abdomen.  You vomit repeatedly.  You have trouble breathing. Summary  Hypertension is when the force of blood pumping through your arteries is too strong. If this condition is not controlled, it may put you  at risk for serious complications.  Your personal target blood pressure may vary depending on your medical conditions, your age, and other factors. For most people, a normal blood pressure is less than 120/80.  Hypertension is managed by lifestyle changes, medicines, or both. Lifestyle changes include weight loss, eating a healthy, low-sodium diet, exercising more, and limiting alcohol. This information is not intended to replace advice given to you by your health care provider. Make sure you discuss any questions you have with your health care provider. Document Revised: 07/01/2018 Document Reviewed: 02/05/2016 Elsevier Patient Education  2020 Elsevier Inc.  Losartan Tablets What is this medicine? LOSARTAN (loe SAR tan) is an angiotensin II receptor blocker, also known as an ARB. It treats high blood pressure. It can  slow kidney damage in some patients. It may also be used to lower the risk of stroke. This medicine may be used for other purposes; ask your health care provider or pharmacist if you have questions. COMMON BRAND NAME(S): Cozaar What should I tell my health care provider before I take this medicine? They need to know if you have any of these conditions:  heart failure  kidney or liver disease  an unusual or allergic reaction to losartan, other medicines, foods, dyes, or preservatives  pregnant or trying to get pregnant  breast-feeding How should I use this medicine? Take this drug by mouth. Take it as directed on the prescription label at the same time every day. You can take it with or without food. If it upsets your stomach, take it with food. Keep taking it unless your health care provider tells you to stop. Talk to your health care provider about the use of this drug in children. While it may be prescribed for children as young as 6 for selected conditions, precautions do apply. Overdosage: If you think you have taken too much of this medicine contact a poison control center or emergency room at once. NOTE: This medicine is only for you. Do not share this medicine with others. What if I miss a dose? If you miss a dose, take it as soon as you can. If it is almost time for your next dose, take only that dose. Do not take double or extra doses. What may interact with this medicine?  blood pressure medicines  diuretics, especially triamterene, spironolactone, or amiloride  fluconazole  NSAIDs, medicines for pain and inflammation, like ibuprofen or naproxen  potassium salts or potassium supplements  rifampin This list may not describe all possible interactions. Give your health care provider a list of all the medicines, herbs, non-prescription drugs, or dietary supplements you use. Also tell them if you smoke, drink alcohol, or use illegal drugs. Some items may interact with your  medicine. What should I watch for while using this medicine? Visit your doctor or health care professional for regular checks on your progress. Check your blood pressure as directed. Ask your doctor or health care professional what your blood pressure should be and when you should contact him or her. Call your doctor or health care professional if you notice an irregular or fast heart beat. Women should inform their doctor if they wish to become pregnant or think they might be pregnant. There is a potential for serious side effects to an unborn child, particularly in the second or third trimester. Talk to your health care professional or pharmacist for more information. You may get drowsy or dizzy. Do not drive, use machinery, or do anything that needs mental alertness  until you know how this drug affects you. Do not stand or sit up quickly, especially if you are an older patient. This reduces the risk of dizzy or fainting spells. Alcohol can make you more drowsy and dizzy. Avoid alcoholic drinks. Avoid salt substitutes unless you are told otherwise by your doctor or health care professional. Do not treat yourself for coughs, colds, or pain while you are taking this medicine without asking your doctor or health care professional for advice. Some ingredients may increase your blood pressure. What side effects may I notice from receiving this medicine? Side effects that you should report to your doctor or health care professional as soon as possible:  confusion, dizziness, light headedness or fainting spells  decreased amount of urine passed  difficulty breathing or swallowing, hoarseness, or tightening of the throat  fast or irregular heart beat, palpitations, or chest pain  skin rash, itching  swelling of your face, lips, tongue, hands, or feet Side effects that usually do not require medical attention (report to your doctor or health care professional if they continue or are  bothersome):  cough  decreased sexual function or desire  headache  nasal congestion or stuffiness  nausea or stomach pain  sore or cramping muscles This list may not describe all possible side effects. Call your doctor for medical advice about side effects. You may report side effects to FDA at 1-800-FDA-1088. Where should I keep my medicine? Keep out of the reach of children and pets. Store at room temperature between 15 and 30 degrees C (59 and 86 degrees F). Protect from light. Keep the container tightly closed. Throw away any unused drug after the expiration date. NOTE: This sheet is a summary. It may not cover all possible information. If you have questions about this medicine, talk to your doctor, pharmacist, or health care provider.  2020 Elsevier/Gold Standard (2018-10-12 12:12:28)

## 2020-03-12 DIAGNOSIS — Z0000 Encounter for general adult medical examination without abnormal findings: Secondary | ICD-10-CM | POA: Insufficient documentation

## 2020-03-12 DIAGNOSIS — I10 Essential (primary) hypertension: Secondary | ICD-10-CM | POA: Insufficient documentation

## 2020-03-12 NOTE — Assessment & Plan Note (Signed)
Patient has had multiple elevated office readings over several years. Meeting criteria for type II HTN.  Today and last office visit are markedly increased and patient is asymptomatic.  170/80 on repeat after resting in room.  Had in depth discussion and education with patient on BP causes, consequences and treatments.  Patient is open to medication if needed in the future but does not want any today. She would like to implement lifestyle changes first to see if she can lower it naturally.  - recommended weight loss, diet changes and increased activity as outlined in AVS - also provided informational handout for losartan which would be a good first line agent for her - follow up in 6 weeks. Obtain BMP and start medication if still markedly elevated.

## 2020-03-12 NOTE — Assessment & Plan Note (Signed)
Also due for colonoscopy, mammogram, pap smear

## 2020-06-27 ENCOUNTER — Encounter: Payer: Self-pay | Admitting: Medical Oncology

## 2020-06-27 ENCOUNTER — Observation Stay
Admission: EM | Admit: 2020-06-27 | Discharge: 2020-06-28 | Disposition: A | Discharge status: Home / Self Care | Payer: BC Managed Care – PPO | Attending: Internal Medicine | Admitting: Internal Medicine

## 2020-06-27 ENCOUNTER — Other Ambulatory Visit: Payer: Self-pay

## 2020-06-27 ENCOUNTER — Emergency Department: Payer: BC Managed Care – PPO

## 2020-06-27 ENCOUNTER — Observation Stay: Payer: BC Managed Care – PPO

## 2020-06-27 ENCOUNTER — Telehealth: Payer: Self-pay

## 2020-06-27 ENCOUNTER — Ambulatory Visit
Admission: EM | Admit: 2020-06-27 | Discharge: 2020-06-27 | Disposition: A | Discharge status: Home / Self Care | Payer: BC Managed Care – PPO

## 2020-06-27 DIAGNOSIS — Z7982 Long term (current) use of aspirin: Secondary | ICD-10-CM | POA: Insufficient documentation

## 2020-06-27 DIAGNOSIS — G43909 Migraine, unspecified, not intractable, without status migrainosus: Secondary | ICD-10-CM | POA: Diagnosis present

## 2020-06-27 DIAGNOSIS — R200 Anesthesia of skin: Secondary | ICD-10-CM | POA: Diagnosis not present

## 2020-06-27 DIAGNOSIS — Z8742 Personal history of other diseases of the female genital tract: Secondary | ICD-10-CM | POA: Insufficient documentation

## 2020-06-27 DIAGNOSIS — Z79899 Other long term (current) drug therapy: Secondary | ICD-10-CM | POA: Insufficient documentation

## 2020-06-27 DIAGNOSIS — I10 Essential (primary) hypertension: Secondary | ICD-10-CM | POA: Diagnosis present

## 2020-06-27 DIAGNOSIS — Z20822 Contact with and (suspected) exposure to covid-19: Secondary | ICD-10-CM | POA: Diagnosis not present

## 2020-06-27 DIAGNOSIS — D649 Anemia, unspecified: Principal | ICD-10-CM | POA: Insufficient documentation

## 2020-06-27 DIAGNOSIS — R519 Headache, unspecified: Secondary | ICD-10-CM | POA: Diagnosis not present

## 2020-06-27 LAB — CBC
HCT: 17 % — ABNORMAL LOW (ref 36.0–46.0)
Hemoglobin: 4.2 g/dL — CL (ref 12.0–15.0)
MCH: 17.3 pg — ABNORMAL LOW (ref 26.0–34.0)
MCHC: 24.7 g/dL — ABNORMAL LOW (ref 30.0–36.0)
MCV: 70 fL — ABNORMAL LOW (ref 80.0–100.0)
Platelets: 587 10*3/uL — ABNORMAL HIGH (ref 150–400)
RBC: 2.43 MIL/uL — ABNORMAL LOW (ref 3.87–5.11)
RDW: 19.5 % — ABNORMAL HIGH (ref 11.5–15.5)
WBC: 7.6 10*3/uL (ref 4.0–10.5)
nRBC: 0.5 % — ABNORMAL HIGH (ref 0.0–0.2)

## 2020-06-27 LAB — IRON AND TIBC
Iron: 11 ug/dL — ABNORMAL LOW (ref 28–170)
Saturation Ratios: 2 % — ABNORMAL LOW (ref 10.4–31.8)
TIBC: 624 ug/dL — ABNORMAL HIGH (ref 250–450)
UIBC: 613 ug/dL

## 2020-06-27 LAB — RETICULOCYTES
Immature Retic Fract: 27.6 % — ABNORMAL HIGH (ref 2.3–15.9)
RBC.: 2.62 MIL/uL — ABNORMAL LOW (ref 3.87–5.11)
Retic Count, Absolute: 68.1 10*3/uL (ref 19.0–186.0)
Retic Ct Pct: 2.6 % (ref 0.4–3.1)

## 2020-06-27 LAB — DIFFERENTIAL
Abs Immature Granulocytes: 0.04 10*3/uL (ref 0.00–0.07)
Basophils Absolute: 0 10*3/uL (ref 0.0–0.1)
Basophils Relative: 0 %
Eosinophils Absolute: 0.1 10*3/uL (ref 0.0–0.5)
Eosinophils Relative: 1 %
Immature Granulocytes: 1 %
Lymphocytes Relative: 22 %
Lymphs Abs: 1.7 10*3/uL (ref 0.7–4.0)
Monocytes Absolute: 0.7 10*3/uL (ref 0.1–1.0)
Monocytes Relative: 9 %
Neutro Abs: 5.1 10*3/uL (ref 1.7–7.7)
Neutrophils Relative %: 67 %

## 2020-06-27 LAB — COMPREHENSIVE METABOLIC PANEL
ALT: 12 U/L (ref 0–44)
AST: 18 U/L (ref 15–41)
Albumin: 3.5 g/dL (ref 3.5–5.0)
Alkaline Phosphatase: 75 U/L (ref 38–126)
Anion gap: 8 (ref 5–15)
BUN: 10 mg/dL (ref 6–20)
CO2: 24 mmol/L (ref 22–32)
Calcium: 9.1 mg/dL (ref 8.9–10.3)
Chloride: 104 mmol/L (ref 98–111)
Creatinine, Ser: 0.77 mg/dL (ref 0.44–1.00)
GFR, Estimated: >60 mL/min (ref >60)
Glucose, Bld: 104 mg/dL — ABNORMAL HIGH (ref 70–99)
Potassium: 3.8 mmol/L (ref 3.5–5.1)
Sodium: 136 mmol/L (ref 135–145)
Total Bilirubin: 0.4 mg/dL (ref 0.3–1.2)
Total Protein: 8.2 g/dL — ABNORMAL HIGH (ref 6.5–8.1)

## 2020-06-27 LAB — PROTIME-INR
INR: 1.1 (ref 0.8–1.2)
Prothrombin Time: 14 seconds (ref 11.4–15.2)

## 2020-06-27 LAB — MAGNESIUM: Magnesium: 2.2 mg/dL (ref 1.7–2.4)

## 2020-06-27 LAB — FOLATE: Folate: 27 ng/mL (ref >5.9)

## 2020-06-27 LAB — PREPARE RBC (CROSSMATCH)

## 2020-06-27 LAB — FERRITIN: Ferritin: 2 ng/mL — ABNORMAL LOW (ref 11–307)

## 2020-06-27 LAB — VITAMIN B12: Vitamin B-12: 190 pg/mL (ref 180–914)

## 2020-06-27 LAB — APTT: aPTT: 29 seconds (ref 24–36)

## 2020-06-27 LAB — ABO/RH: ABO/RH(D): A POS

## 2020-06-27 LAB — TYPE AND SCREEN

## 2020-06-27 MED ORDER — SODIUM CHLORIDE 0.9% FLUSH
3.0000 mL | Freq: Two times a day (BID) | INTRAVENOUS | Status: DC
  Administered 2020-06-27: 3 mL via INTRAVENOUS

## 2020-06-27 MED ORDER — ACETAMINOPHEN 325 MG PO TABS: 650.0000 mg | ORAL_TABLET | Freq: Four times a day (QID) | ORAL | Status: DC | PRN

## 2020-06-27 MED ORDER — SODIUM CHLORIDE 0.9 % IV SOLN
10.0000 mL/h | Freq: Once | INTRAVENOUS | Status: AC
  Administered 2020-06-27: 10 mL/h via INTRAVENOUS

## 2020-06-27 MED ORDER — ACETAMINOPHEN 650 MG RE SUPP: 650.0000 mg | Freq: Four times a day (QID) | RECTAL | Status: DC | PRN

## 2020-06-27 NOTE — ED Notes (Signed)
Lab advising pt's Hgb 4.2, Isaacs MD made aware.

## 2020-06-27 NOTE — Telephone Encounter (Signed)
Patient calls nurse line reporting she had a migraine earlier this week. Patient reports she has a hx of migraines, however patient reports once the migraine ended she noticed left facial numbness. Patient states this has been going on for 2 days now. Patient denies any facial droopiness, dizziness, changes in speech, or weakness. I advised patient to be evaluated at an UC as we have no apts until next week. Patient agreed with plan.

## 2020-06-27 NOTE — ED Triage Notes (Signed)
Pt reports sudden onset severe headache localized to just above R temple on 04/05 at 2000 while sitting watching a movie.  States HA was likely the worst HA of her life.  Pt states she went to bed and had ongoing migraine the following day.  Took Naproxen and Aspirin last night.  Headache resolved but pt realized she's had ongoing numbness in R face from hairline down to throat, including tongue and inner mouth.  No droop noted, smile is even, grips equal.  Pt denies balance problems.  Provider notified.  Pt referred to ED.

## 2020-06-27 NOTE — ED Provider Notes (Signed)
[facility] Emergency Department Provider Note  ____________________________________________   Event Date/Time   First MD Initiated Contact with Patient 06/27/20 1622     (approximate)  I have reviewed the triage vital signs and the nursing notes.   HISTORY  Chief Complaint Numbness    HPI Joanne Hampton is a 50 y.o. female  With h/o morbid obesity, HTN, here with numbness. Sx started on Tuesday with headache over her R temple, 10/10, sharp and stabbing. Took Naproxen and went to bed, HA remained. Over the rest of day, HA went away then numbness started. H/o migraines but has not had one in years. Now co R facial numbness goes down hairline to throat, extends into ear, along with tongue numbness. Also teeth "feel cold." Feels like numbing from dentist. Since then, numbness has worsened. No ongoing HA. No fevers, chills. No CP.        Past Medical History:  Diagnosis Date  . Bronchitis     Patient Active Problem List   Diagnosis Date Noted  . Symptomatic anemia 06/27/2020  . Hypertension 03/12/2020  . Healthcare maintenance 03/12/2020  . At risk for obstructive sleep apnea 07/07/2019  . Elevated BP 02/02/2015  . Morbid obesity (HCC) 01/24/2007  . ANEMIA 01/24/2007  . Migraine headache 05/20/2006    Past Surgical History:  Procedure Laterality Date  . CESAREAN SECTION  1990    Prior to Admission medications   Medication Sig Start Date End Date Taking? Authorizing Provider  albuterol (VENTOLIN HFA) 108 (90 Base) MCG/ACT inhaler Inhale 2 puffs into the lungs every 6 (six) hours as needed for wheezing or shortness of breath. 03/11/20  Yes Anderson, Chelsey L, DO  aspirin EC 81 MG tablet Take 81 mg by mouth daily. Swallow whole.   Yes [provider]  Multiple Vitamin (MULTIVITAMIN) tablet Take 1 tablet by mouth daily.   Yes [provider]  naproxen (NAPROSYN) 250 MG tablet Take 250 mg by mouth 2 (two) times daily as  needed.   Yes [provider]  cetirizine (ZYRTEC) 10 MG tablet Take 1 tablet (10 mg total) by mouth daily. Patient not taking: Reported on 06/27/2020 03/11/20   Jamelle Rushing L, DO    Allergies Patient has no known allergies.  Family History  Problem Relation Age of Onset  . Heart failure Mother     Social History Social History   Tobacco Use  . Smoking status: Never Smoker  . Smokeless tobacco: Never Used  Substance Use Topics  . Alcohol use: Yes    Comment: occasional  . Drug use: No    Review of Systems  Review of Systems  Constitutional: Positive for fatigue. Negative for fever.  HENT: Negative for congestion and sore throat.   Eyes: Negative for visual disturbance.  Respiratory: Negative for cough and shortness of breath.   Cardiovascular: Negative for chest pain.  Gastrointestinal: Negative for abdominal pain, diarrhea, nausea and vomiting.  Genitourinary: Negative for flank pain.  Musculoskeletal: Negative for back pain and neck pain.  Skin: Negative for rash and wound.  Neurological: Positive for weakness and light-headedness.  All other systems reviewed and are negative.    ____________________________________________  PHYSICAL EXAM:      VITAL SIGNS: ED Triage Vitals  Enc Vitals Group     BP 06/27/20 1600 (!) 172/77     Pulse Rate 06/27/20 1600 89     Resp 06/27/20 1600 18     Temp 06/27/20 1600 98.8 F (37.1 C)  Temp Source 06/27/20 1600 Oral     SpO2 06/27/20 1600 100 %     Weight 06/27/20 1555 255 lb (115.7 kg)     Height 06/27/20 1555 5\' 2"  (1.575 m)     Head Circumference --      Peak Flow --      Pain Score 06/27/20 1555 0     Pain Loc --      Pain Edu? --      Excl. in GC? --      Physical Exam Vitals and nursing note reviewed.  Constitutional:      General: She is not in acute distress.    Appearance: She is well-developed.  HENT:     Head: Normocephalic and atraumatic.  Eyes:     Conjunctiva/sclera:  Conjunctivae normal.  Cardiovascular:     Rate and Rhythm: Normal rate and regular rhythm.     Heart sounds: Normal heart sounds.  Pulmonary:     Effort: Pulmonary effort is normal. No respiratory distress.     Breath sounds: No wheezing.  Abdominal:     General: There is no distension.  Musculoskeletal:     Cervical back: Neck supple.  Skin:    General: Skin is warm.     Capillary Refill: Capillary refill takes less than 2 seconds.     Findings: No rash.  Neurological:     Mental Status: She is alert and oriented to person, place, and time.     Motor: No abnormal muscle tone.     Comments: Subjectively diminished sensation along R face, V1-V3 distribution. CN otherwise intact. Normal sensation to light touch. Strength 5/5 bl UE and LE.        ____________________________________________   LABS (all labs ordered are listed, but only abnormal results are displayed)  Labs Reviewed  CBC - Abnormal; Notable for the following components:      Result Value   RBC 2.43 (*)    Hemoglobin 4.2 (*)    HCT 17.0 (*)    MCV 70.0 (*)    MCH 17.3 (*)    MCHC 24.7 (*)    RDW 19.5 (*)    Platelets 587 (*)    nRBC 0.5 (*)    All other components within normal limits  COMPREHENSIVE METABOLIC PANEL - Abnormal; Notable for the following components:   Glucose, Bld 104 (*)    Total Protein 8.2 (*)    All other components within normal limits  IRON AND TIBC - Abnormal; Notable for the following components:   Iron 11 (*)    TIBC 624 (*)    Saturation Ratios 2 (*)    All other components within normal limits  FERRITIN - Abnormal; Notable for the following components:   Ferritin 2 (*)    All other components within normal limits  RETICULOCYTES - Abnormal; Notable for the following components:   RBC. 2.62 (*)    Immature Retic Fract 27.6 (*)    All other components within normal limits  SARS CORONAVIRUS 2 (TAT 6-24 HRS)  PROTIME-INR  APTT  DIFFERENTIAL  MAGNESIUM  VITAMIN B12  FOLATE   OCCULT BLOOD X 1 CARD TO LAB, STOOL  COMPREHENSIVE METABOLIC PANEL  CBC  HIV ANTIBODY (ROUTINE TESTING W REFLEX)  CBG MONITORING, ED  POC URINE PREG, ED  TYPE AND SCREEN  PREPARE RBC (CROSSMATCH)  TYPE AND SCREEN  ABO/RH    ____________________________________________  EKG: Normal sinus rhythm, VR 97. PR 146, QRS 70, QTc 449. No acute  ST elevation or depression. No ischemia or infarct. ________________________________________  RADIOLOGY All imaging, including plain films, CT scans, and ultrasounds, independently reviewed by me, and interpretations confirmed via formal radiology reads.  ED MD interpretation:   CT Head: NAICA   Official radiology report(s): CT HEAD WO CONTRAST  Result Date: 06/27/2020 CLINICAL DATA:  Right facial numbness. EXAM: CT HEAD WITHOUT CONTRAST TECHNIQUE: Contiguous axial images were obtained from the base of the skull through the vertex without intravenous contrast. COMPARISON:  None. FINDINGS: Brain: No evidence of acute infarction, hemorrhage, hydrocephalus, extra-axial collection or mass lesion/mass effect. Vascular: No hyperdense vessel or unexpected calcification. Skull: Normal. Negative for fracture or focal lesion. Sinuses/Orbits: No acute finding. Other: None. IMPRESSION: Normal head CT. Electronically Signed   By: Lupita RaiderJames  Green Jr M.D.   On: 06/27/2020 16:38   MR BRAIN WO CONTRAST  Result Date: 06/27/2020 CLINICAL DATA:  Severe headache EXAM: MRI HEAD WITHOUT CONTRAST TECHNIQUE: Multiplanar, multiecho pulse sequences of the brain and surrounding structures were obtained without intravenous contrast. COMPARISON:  None. FINDINGS: Brain: No acute infarct, mass effect or extra-axial collection. No acute or chronic hemorrhage. Normal white matter signal, parenchymal volume and CSF spaces. Expanded, partially empty sella has increased in size since 01/13/2005. Increased CSF space surrounding the optic nerves. Vascular: Major flow voids are preserved. Skull  and upper cervical spine: Normal calvarium and skull base. Visualized upper cervical spine and soft tissues are normal. Sinuses/Orbits:No paranasal sinus fluid levels or advanced mucosal thickening. No mastoid or middle ear effusion. Question right papilledema. IMPRESSION: 1. No acute intracranial abnormality. 2. Expanded, partially empty sella and increased CSF space surrounding the optic nerves, which may be seen in the setting of idiopathic intracranial hypertension. Electronically Signed   By: Deatra RobinsonKevin  Herman M.D.   On: 06/27/2020 23:57    ____________________________________________  PROCEDURES   Procedure(s) performed (including Critical Care):  .Critical Care Performed by: Shaune PollackIsaacs, Michaeljoseph Revolorio, MD Authorized by: Shaune PollackIsaacs, Shalene Gallen, MD   Critical care provider statement:    Critical care time (minutes):  35   Critical care time was exclusive of:  Separately billable procedures and treating other patients and teaching time   Critical care was necessary to treat or prevent imminent or life-threatening deterioration of the following conditions:  Cardiac failure, circulatory failure and respiratory failure   Critical care was time spent personally by me on the following activities:  Development of treatment plan with patient or surrogate, discussions with consultants, evaluation of patient's response to treatment, examination of patient, obtaining history from patient or surrogate, ordering and performing treatments and interventions, ordering and review of laboratory studies, ordering and review of radiographic studies, pulse oximetry, re-evaluation of patient's condition and review of old charts   I assumed direction of critical care for this patient from another provider in my specialty: no      ____________________________________________  INITIAL IMPRESSION / MDM / ASSESSMENT AND PLAN / ED COURSE  As part of my medical decision making, I reviewed the following data within the electronic  MEDICAL RECORD NUMBER Nursing notes reviewed and incorporated, Old chart reviewed, Notes from prior ED visits, and [place] Controlled Substance Database       *Joanne HainesCharlene Hampton was evaluated in Emergency Department on 06/28/2020 for the symptoms described in the history of present illness. She was evaluated in the context of the global COVID-19 pandemic, which necessitated consideration that the patient might be at risk for infection with the SARS-CoV-2 virus that causes COVID-19. Institutional protocols and algorithms that pertain to  the evaluation of patients at risk for COVID-19 are in a state of rapid change based on information released by regulatory bodies including the CDC and federal and state organizations. These policies and algorithms were followed during the patient's care in the ED.  Some ED evaluations and interventions may be delayed as a result of limited staffing during the pandemic.*     Medical Decision Making:  50 yo F here with facial numbness/tingling and generalized weakness. Lab work shows severe microcytic anemia, suspected IDA related to heavy menstrual periods. CMP unremarkable, with normal LFts and renal function. Denies any melena or signs of GIB. INR 1.1 and pt is not on blood thinners.  Suspect symptomatic IDA 2/2 vaginal bleeding. Pt's headache, facial "tingling" may be related to this though could also represent primary HA, TGN, less likely CVA in absence of other neuro deficits. CT head obtained and is negative.  Will order MR for eval of CVA, admit for transfusion and w/u of anemia.  ____________________________________________  FINAL CLINICAL IMPRESSION(S) / ED DIAGNOSES  Final diagnoses:  Symptomatic anemia  Facial numbness     MEDICATIONS GIVEN DURING THIS VISIT:  Medications  sodium chloride flush (NS) 0.9 % injection 3 mL (3 mLs Intravenous Given 06/27/20 2255)  acetaminophen (TYLENOL) tablet 650 mg (has no administration in time range)    Or  acetaminophen  (TYLENOL) suppository 650 mg (has no administration in time range)  0.9 %  sodium chloride infusion (10 mL/hr Intravenous New Bag/Given 06/27/20 2023)     ED Discharge Orders    None       Note:  This document was prepared using Dragon voice recognition software and may include unintentional dictation errors.   Shaune Pollack, MD 06/28/20 [phone]

## 2020-06-27 NOTE — ED Triage Notes (Signed)
Pt reports sudden onset severe headache localized to just above R temple on 04/05 at 2000 while sitting watching a movie.  States HA was likely the worst HA of her life.  Pt states she went to bed and had ongoing migraine the following day.  Took Naproxen and Aspirin last night.  Headache resolved but pt realized she's had ongoing numbness in R face from hairline down to throat, including tongue and inner mouth.  No droop noted, smile is even, grips equal.  Pt denies balance problems.  Provider notified.  Pt referred to ED.    Copied from triage note from UC.

## 2020-06-27 NOTE — H&P (Signed)
History and Physical   [place] JTT:[id number] DOB: 1970-10-06 DOA: 06/27/2020  PCP: Leeroy Bock, DO   Patient coming from: Home  Chief Complaint: Headache and numbness  HPI: Joanne Hampton is a 50 y.o. female with medical history significant of anemia, hypertension, migraines, morbid obesity who presents with facial numbness.  States that symptoms started on Tuesday with a headache located at her right temple. The pain was described as a 10 out of 10 sharp/stabbing pain.  She took naproxen and went to bed but this did not seem to help her headache.  She states she does have a history of migraines but did not have any for the past couple years. And has not had one presenting with numbness before.  Later her headache did improve but then she began to have numbness on the right side of her face.  The numbness is described as extending from around her hairline along her face starting around midline and down to her neck/throat area.  She also states that her teeth began to feel cold like getting numbing at a dentist office.  She has had persistent/worsening numbness causing her to present to urgent care initially for evaluation today and was referred to the ED. she denies fevers, chills, chest pain, shortness of breath, dumping, constipation, nausea, vomiting, diarrhea.  She is found to have a hemoglobin of 4.2 as below in the ED.  She denies any dark stools or bloody stools.  She reports that she is always had heavy periods and chart review reveals that she has always had at least mild anemia of around 10 at last lab check around 10 years ago.  She states that for the past year she has had increased bleeding with more clots and has had some irregular periods as well as periods that last longer.  She also reports her most recent period lasted about 2 weeks.  ED Course: Vital signs in the ED significant for blood pressure in the 170 systolic.  Lab work-up showed CMP with calcium 8.2.   CBC with hemoglobin 4.2, MCV 70, platelets 587.  PT, PTT, INR within normal limits.  Anemia work-up pending: B12, folate, iron, ferritin, TIBC, FOBT.  Particular site count was inappropriately normal.  CT head showed no acute normality.  MR brain is pending.  Patient has been typed and screened and 3 units of PRBCs have been ordered.  Review of Systems: As per HPI otherwise all other systems reviewed and are negative.  Past Medical History:  Diagnosis Date  . Bronchitis     Past Surgical History:  Procedure Laterality Date  . CESAREAN SECTION  1990    Social History  reports that she has never smoked. She has never used smokeless tobacco. She reports current alcohol use. She reports that she does not use drugs.  No Known Allergies  Family History  Problem Relation Age of Onset  . Heart failure Mother   Reviewed on admission  Prior to Admission medications   Medication Sig Start Date End Date Taking? Authorizing Provider  albuterol (VENTOLIN HFA) 108 (90 Base) MCG/ACT inhaler Inhale 2 puffs into the lungs every 6 (six) hours as needed for wheezing or shortness of breath. 03/11/20  Yes Anderson, Chelsey L, DO  aspirin EC 81 MG tablet Take 81 mg by mouth daily. Swallow whole.   Yes [provider]  Multiple Vitamin (MULTIVITAMIN) tablet Take 1 tablet by mouth daily.   Yes [provider]  naproxen (NAPROSYN) 250 MG tablet Take 250  mg by mouth 2 (two) times daily as needed.   Yes [provider]  cetirizine (ZYRTEC) 10 MG tablet Take 1 tablet (10 mg total) by mouth daily. Patient not taking: Reported on 06/27/2020 03/11/20   Leeroy Bock, DO    Physical Exam: Vitals:   06/27/20 1555 06/27/20 1600  BP:  (!) 172/77  Pulse:  89  Resp:  18  Temp:  98.8 F (37.1 C)  TempSrc:  Oral  SpO2:  100%  Weight: 115.7 kg   Height: 5\' 2"  (1.575 m)    Physical Exam Constitutional:      General: She is not in acute distress.    Appearance: Normal  appearance. She is obese.  HENT:     Head: Normocephalic and atraumatic.     Mouth/Throat:     Mouth: Mucous membranes are moist.     Pharynx: Oropharynx is clear.  Eyes:     Extraocular Movements: Extraocular movements intact.     Pupils: Pupils are equal, round, and reactive to light.  Cardiovascular:     Rate and Rhythm: Normal rate and regular rhythm.     Pulses: Normal pulses.     Heart sounds: Normal heart sounds.  Pulmonary:     Effort: Pulmonary effort is normal. No respiratory distress.     Breath sounds: Normal breath sounds.  Abdominal:     General: Bowel sounds are normal. There is no distension.     Palpations: Abdomen is soft.     Tenderness: There is no abdominal tenderness.  Musculoskeletal:        General: No swelling or deformity.  Skin:    General: Skin is warm and dry.  Neurological:     Mental Status: She is oriented to person, place, and time. Mental status is at baseline.     Comments: Right facial numbness. No focal weakness.    Labs on Admission: I have personally reviewed following labs and imaging studies  CBC: Recent Labs  Lab 06/27/20 1603  WBC 7.6  NEUTROABS 5.1  HGB 4.2*  HCT 17.0*  MCV 70.0*  PLT 587*    Basic Metabolic Panel: Recent Labs  Lab 06/27/20 1603 06/27/20 1752  NA 136  --   K 3.8  --   CL 104  --   CO2 24  --   GLUCOSE 104*  --   BUN 10  --   CREATININE 0.77  --   CALCIUM 9.1  --   MG  --  2.2    GFR: Estimated Creatinine Clearance: 100.2 mL/min (by C-G formula based on SCr of 0.77 mg/dL).  Liver Function Tests: Recent Labs  Lab 06/27/20 1603  AST 18  ALT 12  ALKPHOS 75  BILITOT 0.4  PROT 8.2*  ALBUMIN 3.5    Urine analysis:    Component Value Date/Time   COLORURINE YELLOW 04/30/2010 1909   APPEARANCEUR CLOUDY (A) 04/30/2010 1909   LABSPEC 1.020 06/16/2010 1558   PHURINE 7.5 06/16/2010 1558   GLUCOSEU NEGATIVE 06/16/2010 1558   HGBUR NEGATIVE 06/16/2010 1558   BILIRUBINUR NEGATIVE 06/16/2010  1558   BILIRUBINUR negative 06/02/2010 1056   KETONESUR NEGATIVE 06/16/2010 1558   PROTEINUR NEGATIVE 06/16/2010 1558   UROBILINOGEN 0.2 06/16/2010 1558   NITRITE NEGATIVE 06/16/2010 1558   LEUKOCYTESUR  06/16/2010 1558    NEGATIVE Biochemical Testing Only. Please order routine urinalysis from main lab if confirmatory testing is needed.    Radiological Exams on Admission: CT HEAD WO CONTRAST  Result Date:  06/27/2020 CLINICAL DATA:  Right facial numbness. EXAM: CT HEAD WITHOUT CONTRAST TECHNIQUE: Contiguous axial images were obtained from the base of the skull through the vertex without intravenous contrast. COMPARISON:  None. FINDINGS: Brain: No evidence of acute infarction, hemorrhage, hydrocephalus, extra-axial collection or mass lesion/mass effect. Vascular: No hyperdense vessel or unexpected calcification. Skull: Normal. Negative for fracture or focal lesion. Sinuses/Orbits: No acute finding. Other: None. IMPRESSION: Normal head CT. Electronically Signed   By: Lupita Raider M.D.   On: 06/27/2020 16:38   EKG: Independently reviewed.  Normal sinus rhythm at 97 bpm.  Assessment/Plan Principal Problem:   Symptomatic anemia Active Problems:   Morbid obesity (HCC)   Migraine headache   Hypertension  Severe anemia Symptomatic anemia? > Presented with facial numbness and recent headache that is unilateral nature. > Found to have hemoglobin of 4.2 in the setting of history of chronic anemia and history of heavy periods with increased vaginal bleeding for the past year or more, with increased clots. > 3 units of PRBCs ordered in ED. > Reticulocyte count inappropriately normal in ED.  FOBT pending but no bloody or dark stools reported. - Monitor on telemetry - Continue with 3 units transfusion and follow-up posttransfusion hemoglobin - Follow-up pending studies: Iron, ferritin, TIBC, B12, folate - Abdominal ultrasound to evaluate for fibroids - Trend CBC - SCDs for now  Facial  numbness > Due to unilateral facial numbness CVA work-up begun in ED. > CT head without acute abnormality, MRI brain ordered and pending. > Suspicion that this may be due to hypoperfusion in the setting of her severe anemia versus atypical migraine. - Follow-up MRI brain - Allow for permissive hypertension to 220s systolic as we await MRI results - Stroke swallow screen If MRI is positive we will proceed with the following: - Neurology consult, Consider Statin/ASA, Echocardiogram, Carotid doppler, A1C, Lipid panel, Tele monitoring, SLP eval, PT/OT  Hypertension > BP in the 170 systolic in the ED - Permissive hypertension as above for now  History of migraines > Has not had a migraine for a couple of years as above.  She has had this recent headache in the setting of her severe anemia.  Unclear if the numbness could represent atypical migraine. - Continue to monitor and follow-up MRI results  Morbid obesity > BMI noted to be 46.64.  DVT prophylaxis: SCDs  Code Status:   Full Family Communication:  Patient has been updating her family by phone Disposition Plan:   Patient is from:  Home  Anticipated DC to:  Home  Anticipated DC date:  1 to 3 days  Anticipated DC barriers: None  Consults called:  None at time of admission, pending MRI results may need neurology consult. Admission status:  Observation, telemetry   Severity of Illness: The appropriate patient status for this patient is OBSERVATION. Observation status is judged to be reasonable and necessary in order to provide the required intensity of service to ensure the patient's safety. The patient's presenting symptoms, physical exam findings, and initial radiographic and laboratory data in the context of their medical condition is felt to place them at decreased risk for further clinical deterioration. Furthermore, it is anticipated that the patient will be medically stable for discharge from the hospital within 2 midnights of  admission. The following factors support the patient status of observation.   " The patient's presenting symptoms include headache, facial numbness. " The physical exam findings include right facial numbness along all 3 divisions of cranial nerve V. "  The initial radiographic and laboratory data are hemoglobin 40.2, platelets 587, MCV 70.  Reticulocyte count inappropriately normal..  Synetta FailAlexander B Estiven Kohan MD [facility]  How to contact the [facility] Attending or Consulting provider 7A - 7P or covering provider during after hours 7P -7A, for this patient?   1. Check the care team in [facility] and look for a) attending/consulting [facility] provider listed and b) the [facility] team listed 2. Log into [email or web address] and use [place]'s universal password to access. If you do not have the password, please contact the hospital operator. 3. Locate the [facility] provider you are looking for under [facility] and page to a number that you can be directly reached. 4. If you still have difficulty reaching the provider, please page the [facility] (Director on Call) for the Hospitalists listed on amion for assistance.  06/27/2020, 7:29 PM

## 2020-06-28 ENCOUNTER — Observation Stay: Payer: BC Managed Care – PPO

## 2020-06-28 DIAGNOSIS — N858 Other specified noninflammatory disorders of uterus: Secondary | ICD-10-CM | POA: Diagnosis not present

## 2020-06-28 DIAGNOSIS — D259 Leiomyoma of uterus, unspecified: Secondary | ICD-10-CM | POA: Diagnosis not present

## 2020-06-28 DIAGNOSIS — D649 Anemia, unspecified: Secondary | ICD-10-CM

## 2020-06-28 DIAGNOSIS — N939 Abnormal uterine and vaginal bleeding, unspecified: Secondary | ICD-10-CM | POA: Diagnosis not present

## 2020-06-28 LAB — COMPREHENSIVE METABOLIC PANEL
ALT: 10 U/L (ref 0–44)
AST: 13 U/L — ABNORMAL LOW (ref 15–41)
Albumin: 3.2 g/dL — ABNORMAL LOW (ref 3.5–5.0)
Alkaline Phosphatase: 71 U/L (ref 38–126)
Anion gap: 7 (ref 5–15)
BUN: 11 mg/dL (ref 6–20)
CO2: 25 mmol/L (ref 22–32)
Calcium: 8.5 mg/dL — ABNORMAL LOW (ref 8.9–10.3)
Chloride: 106 mmol/L (ref 98–111)
Creatinine, Ser: 0.61 mg/dL (ref 0.44–1.00)
GFR, Estimated: >60 mL/min (ref >60)
Glucose, Bld: 101 mg/dL — ABNORMAL HIGH (ref 70–99)
Potassium: 4.3 mmol/L (ref 3.5–5.1)
Sodium: 138 mmol/L (ref 135–145)
Total Bilirubin: 0.4 mg/dL (ref 0.3–1.2)
Total Protein: 7 g/dL (ref 6.5–8.1)

## 2020-06-28 LAB — CBC
HCT: 22.6 % — ABNORMAL LOW (ref 36.0–46.0)
Hemoglobin: 6.4 g/dL — ABNORMAL LOW (ref 12.0–15.0)
MCH: 20.8 pg — ABNORMAL LOW (ref 26.0–34.0)
MCHC: 28.3 g/dL — ABNORMAL LOW (ref 30.0–36.0)
MCV: 73.6 fL — ABNORMAL LOW (ref 80.0–100.0)
Platelets: 429 10*3/uL — ABNORMAL HIGH (ref 150–400)
RBC: 3.07 MIL/uL — ABNORMAL LOW (ref 3.87–5.11)
RDW: 19.8 % — ABNORMAL HIGH (ref 11.5–15.5)
WBC: 8.5 10*3/uL (ref 4.0–10.5)
nRBC: 0 % (ref 0.0–0.2)

## 2020-06-28 LAB — SARS CORONAVIRUS 2 (TAT 6-24 HRS): SARS Coronavirus 2: NEGATIVE

## 2020-06-28 LAB — HIV ANTIBODY (ROUTINE TESTING W REFLEX): HIV Screen 4th Generation wRfx: NONREACTIVE

## 2020-06-28 MED ORDER — SODIUM CHLORIDE 0.9 % IV SOLN
200.0000 mg | Freq: Once | INTRAVENOUS | Status: DC
  Filled 2020-06-28: qty 10

## 2020-06-28 MED ORDER — FERROUS SULFATE 325 (65 FE) MG PO TABS: 325.0000 mg | ORAL_TABLET | Freq: Three times a day (TID) | ORAL | 3 refills | Status: DC

## 2020-06-28 MED ORDER — FERROUS SULFATE 325 (65 FE) MG PO TABS
325.0000 mg | ORAL_TABLET | Freq: Three times a day (TID) | ORAL | Status: DC
  Administered 2020-06-28: 325 mg via ORAL
  Filled 2020-06-28: qty 1

## 2020-06-28 NOTE — Progress Notes (Signed)
Pt received 3 units of blood last night. Pt did not need premed. Tolerated well. Pt's VSS. Last bag was finished around 0530 this morning.

## 2020-06-28 NOTE — Plan of Care (Signed)

## 2020-06-28 NOTE — Discharge Instructions (Signed)
 [employer] medicine (25th ed., pp. 1059-1068). [place], [place]: Elsevier.">  Anemia  Anemia is a condition in which there is not enough red blood cells or hemoglobin in the blood. Hemoglobin is a substance in red blood cells that carries oxygen. When you do not have enough red blood cells or hemoglobin (are anemic), your body cannot get enough oxygen and your organs may not work properly. As a result, you may feel very tired or have other problems. What are the causes? Common causes of anemia include:  Excessive bleeding. Anemia can be caused by excessive bleeding inside or outside the body, including bleeding from the intestines or from heavy menstrual periods in females.  Poor nutrition.  Long-lasting (chronic) kidney, thyroid, and liver disease.  Bone marrow disorders, spleen problems, and blood disorders.  Cancer and treatments for cancer.  HIV (human immunodeficiency virus) and AIDS (acquired immunodeficiency syndrome).  Infections, medicines, and autoimmune disorders that destroy red blood cells. What are the signs or symptoms? Symptoms of this condition include:  Minor weakness.  Dizziness.  Headache, or difficulties concentrating and sleeping.  Heartbeats that feel irregular or faster than normal (palpitations).  Shortness of breath, especially with exercise.  Pale skin, lips, and nails, or cold hands and feet.  Indigestion and nausea. Symptoms may occur suddenly or develop slowly. If your anemia is mild, you may not have symptoms. How is this diagnosed? This condition is diagnosed based on blood tests, your medical history, and a physical exam. In some cases, a test may be needed in which cells are removed from the soft tissue inside of a bone and looked at under a microscope (bone marrow biopsy). Your health care provider may also check your stool (feces) for blood and may do additional testing to look for the cause of your bleeding. Other tests may  include:  Imaging tests, such as a CT scan or MRI.  A procedure to see inside your esophagus and stomach (endoscopy).  A procedure to see inside your colon and rectum (colonoscopy). How is this treated? Treatment for this condition depends on the cause. If you continue to lose a lot of blood, you may need to be treated at a hospital. Treatment may include:  Taking supplements of iron, vitamin B12, or folic acid.  Taking a hormone medicine (erythropoietin) that can help to stimulate red blood cell growth.  Having a blood transfusion. This may be needed if you lose a lot of blood.  Making changes to your diet.  Having surgery to remove your spleen. Follow these instructions at home:  Take over-the-counter and prescription medicines only as told by your health care provider.  Take supplements only as told by your health care provider.  Follow any diet instructions that you were given by your health care provider.  Keep all follow-up visits as told by your health care provider. This is important. Contact a health care provider if:  You develop new bleeding anywhere in the body. Get help right away if:  You are very weak.  You are short of breath.  You have pain in your abdomen or chest.  You are dizzy or feel faint.  You have trouble concentrating.  You have bloody stools, black stools, or tarry stools.  You vomit repeatedly or you vomit up blood. These symptoms may represent a serious problem that is an emergency. Do not wait to see if the symptoms will go away. Get medical help right away. Call your local emergency services (911 in the [place] Do   not drive yourself to the hospital. Summary  Anemia is a condition in which you do not have enough red blood cells or enough of a substance in your red blood cells that carries oxygen (hemoglobin).  Symptoms may occur suddenly or develop slowly.  If your anemia is mild, you may not have symptoms.  This condition is  diagnosed with blood tests, a medical history, and a physical exam. Other tests may be needed.  Treatment for this condition depends on the cause of the anemia. This information is not intended to replace advice given to you by your health care provider. Make sure you discuss any questions you have with your health care provider. Document Revised: 02/14/2019 Document Reviewed: 02/14/2019 Elsevier Patient Education  [place]. Patient advised to follow-up with [facility] and Dr. Mike Gip for her anemia.

## 2020-06-28 NOTE — Discharge Summary (Signed)
[facility] [place] at [facility]   PATIENT NAME: Joanne Hampton    MR#:  [id number]  DATE OF BIRTH:  [birth year]  DATE OF ADMISSION:  06/27/2020 ADMITTING PHYSICIAN: Synetta Fail, MD  DATE OF DISCHARGE: 06/28/2020  PRIMARY CARE PHYSICIAN: Leeroy Bock, DO    ADMISSION DIAGNOSIS:  Symptomatic anemia [D64.9] History of heavy vaginal bleeding [Z87.42]  DISCHARGE DIAGNOSIS:  Iron deficiency anemia suspected from menorrhagia status post blood transfusion  SECONDARY DIAGNOSIS:   Past Medical History:  Diagnosis Date  . Bronchitis     HOSPITAL COURSE:  Joanne Hampton is a 50 y.o. female with medical history significant of anemia, hypertension, migraines, morbid obesity who presents with facial numbness.She is found to have a hemoglobin of 4.2 as below in the ED.  She denies any dark stools or bloody stools.  She reports that she is always had heavy periods and chart review reveals that she has always had at least mild anemia of around 10 at last lab check around 10 years ago.    Symptomatic anemia/iron deficiency suspected due to menorrhagia -- patient came in with facial numbness. She was ruled out with stroke with negative MRI. Symptoms improving. -- She was found to have hemoglobin of 4.2--- three unit blood transfusion-- 6.4 -- currently not having her menstrual cycle -- patient has had issues with heavy. For a while. She has not followed up with GYN as outpatient -- patient will follow-up with [facility]. -- Pelvic ultrasound to be done prior to discharge -- received IV venofer and started on oral iron -- patient will see Dr. Merlene Pulling as outpatient for further treatment of iron deficiency anemia -- denies any dark or black colored stools. Follow-up PCP for any G.I. workup needed  Facial numbness right-sided -- workup negative for stroke -- continue baby aspirin  Morbid obesity with sleep apnea -- patient has been compliant with  her CPAP  History of migraine headaches -- stable. Currently denies any pain. Facial numbness unclear if could represent atypical migraine  Hemodynamically stable. Will discharge patient to home with outpatient follow-up with GYN and Dr. Merlene Pulling. Plan was discussed with patient she is in agreement.  CONSULTS OBTAINED:    DRUG ALLERGIES:  No Known Allergies  DISCHARGE MEDICATIONS:   Allergies as of 06/28/2020   No Known Allergies     Medication List    STOP taking these medications   cetirizine 10 MG tablet Commonly known as: ZYRTEC   naproxen 250 MG tablet Commonly known as: NAPROSYN     TAKE these medications   albuterol 108 (90 Base) MCG/ACT inhaler Commonly known as: VENTOLIN HFA Inhale 2 puffs into the lungs every 6 (six) hours as needed for wheezing or shortness of breath.   aspirin EC 81 MG tablet Take 81 mg by mouth daily. Swallow whole.   ferrous sulfate 325 (65 FE) MG tablet Take 1 tablet (325 mg total) by mouth 3 (three) times daily with meals.   multivitamin tablet Take 1 tablet by mouth daily.       If you experience worsening of your admission symptoms, develop shortness of breath, life threatening emergency, suicidal or homicidal thoughts you must seek medical attention immediately by calling 911 or calling your MD immediately  if symptoms less severe.  You Must read complete instructions/literature along with all the possible adverse reactions/side effects for all the Medicines you take and that have been prescribed to you. Take any new Medicines after you have completely understood and  accept all the possible adverse reactions/side effects.   Please note  You were cared for by a hospitalist during your hospital stay. If you have any questions about your discharge medications or the care you received while you were in the hospital after you are discharged, you can call the unit and asked to speak with the hospitalist on call if the hospitalist that  took care of you is not available. Once you are discharged, your primary care physician will handle any further medical issues. Please note that NO REFILLS for any discharge medications will be authorized once you are discharged, as it is imperative that you return to your primary care physician (or establish a relationship with a primary care physician if you do not have one) for your aftercare needs so that they can reassess your need for medications and monitor your lab values. Today   SUBJECTIVE   Facial numbness improving. Patient denies any issues. Currently not having her menstrual cycle.  Denies any abdominal pain  VITAL SIGNS:  Blood pressure 138/87, pulse 72, temperature (!) 97.5 F (36.4 C), temperature source Oral, resp. rate 18, height 5\' 2"  (1.575 m), weight 114.5 kg, SpO2 100 %.  I/O:    Intake/Output Summary (Last 24 hours) at 06/28/2020 1042 Last data filed at 06/28/2020 0743 Gross per 24 hour  Intake 1575 ml  Output 0 ml  Net 1575 ml    PHYSICAL EXAMINATION:  GENERAL:  50 y.o.-year-old patient lying in the bed with no acute distress. obese HEENT: Head atraumatic, normocephalic. Oropharynx and nasopharynx clear. Pallor+ NECK:  Supple, no jugular venous distention. No thyroid enlargement, no tenderness.  LUNGS: Normal breath sounds bilaterally, no wheezing, rales,rhonchi or crepitation. No use of accessory muscles of respiration.  CARDIOVASCULAR: S1, S2 normal. No murmurs, rubs, or gallops.  ABDOMEN: Soft, non-tender, non-distended. Bowel sounds present. No organomegaly or mass.  EXTREMITIES: No pedal edema, cyanosis, or clubbing.  NEUROLOGIC: Cranial nerves II through XII are intact. Muscle strength 5/5 in all extremities. Sensation intact. Gait not checked.  PSYCHIATRIC: The patient is alert and oriented x 3.  SKIN: No obvious rash, lesion, or ulcer.   DATA REVIEW:   CBC  Recent Labs  Lab 06/28/20 0428  WBC 8.5  HGB 6.4*  HCT 22.6*  PLT 429*    Chemistries   Recent Labs  Lab 06/27/20 1752 06/28/20 0428  NA  --  138  K  --  4.3  CL  --  106  CO2  --  25  GLUCOSE  --  101*  BUN  --  11  CREATININE  --  0.61  CALCIUM  --  8.5*  MG 2.2  --   AST  --  13*  ALT  --  10  ALKPHOS  --  71  BILITOT  --  0.4    Microbiology Results   Recent Results (from the past 240 hour(s))  SARS CORONAVIRUS 2 (TAT 6-24 HRS) Nasopharyngeal Nasopharyngeal Swab     Status: None   Collection Time: 06/27/20  7:51 PM   Specimen: Nasopharyngeal Swab  Result Value Ref Range Status   SARS Coronavirus 2 NEGATIVE NEGATIVE Final    Comment: (NOTE) SARS-CoV-2 target nucleic acids are NOT DETECTED.  The SARS-CoV-2 RNA is generally detectable in upper and lower respiratory specimens during the acute phase of infection. Negative results do not preclude SARS-CoV-2 infection, do not rule out co-infections with other pathogens, and should not be used as the sole basis for treatment or other  patient management decisions. Negative results must be combined with clinical observations, patient history, and epidemiological information. The expected result is Negative.  Fact Sheet for Patients: HairSlick.no  Fact Sheet for Healthcare Providers: quierodirigir.com  This test is not yet approved or cleared by the [place] FDA and  has been authorized for detection and/or diagnosis of SARS-CoV-2 by FDA under an Emergency Use Authorization (EUA). This EUA will remain  in effect (meaning this test can be used) for the duration of the COVID-19 declaration under Se ction 564(b)(1) of the Act, 21 U.S.C. section 360bbb-3(b)(1), unless the authorization is terminated or revoked sooner.  Performed at [facility], [address], [place], [place] [address]     RADIOLOGY:  CT HEAD WO CONTRAST  Result Date: 06/27/2020 CLINICAL DATA:  Right facial numbness. EXAM: CT HEAD WITHOUT CONTRAST TECHNIQUE: Contiguous  axial images were obtained from the base of the skull through the vertex without intravenous contrast. COMPARISON:  None. FINDINGS: Brain: No evidence of acute infarction, hemorrhage, hydrocephalus, extra-axial collection or mass lesion/mass effect. Vascular: No hyperdense vessel or unexpected calcification. Skull: Normal. Negative for fracture or focal lesion. Sinuses/Orbits: No acute finding. Other: None. IMPRESSION: Normal head CT. Electronically Signed   By: Lupita Raider M.D.   On: 06/27/2020 16:38   MR BRAIN WO CONTRAST  Result Date: 06/27/2020 CLINICAL DATA:  Severe headache EXAM: MRI HEAD WITHOUT CONTRAST TECHNIQUE: Multiplanar, multiecho pulse sequences of the brain and surrounding structures were obtained without intravenous contrast. COMPARISON:  None. FINDINGS: Brain: No acute infarct, mass effect or extra-axial collection. No acute or chronic hemorrhage. Normal white matter signal, parenchymal volume and CSF spaces. Expanded, partially empty sella has increased in size since 01/13/2005. Increased CSF space surrounding the optic nerves. Vascular: Major flow voids are preserved. Skull and upper cervical spine: Normal calvarium and skull base. Visualized upper cervical spine and soft tissues are normal. Sinuses/Orbits:No paranasal sinus fluid levels or advanced mucosal thickening. No mastoid or middle ear effusion. Question right papilledema. IMPRESSION: 1. No acute intracranial abnormality. 2. Expanded, partially empty sella and increased CSF space surrounding the optic nerves, which may be seen in the setting of idiopathic intracranial hypertension. Electronically Signed   By: Deatra Robinson M.D.   On: 06/27/2020 23:57     CODE STATUS:     Code Status Orders  (From admission, onward)         Start     Ordered   06/27/20 1901  Full code  Continuous        06/27/20 1903        Code Status History    This patient has a current code status but no historical code status.   [facility]  Planning Activity       TOTAL TIME TAKING CARE OF THIS PATIENT: *35* minutes.    Enedina Finner M.D  [facility]    CC: Primary care physician; Anderson, Chelsey L, DO

## 2020-06-28 NOTE — Progress Notes (Signed)
Patient ID: Joanne Hampton, female   DOB: 07/11/1970, 50 y.o.   MRN: [id number]  Left message for [name] regarding her pelvic ultrasound results and did recommend her to follow-up with bedside OB/GYN

## 2020-06-29 DIAGNOSIS — N921 Excessive and frequent menstruation with irregular cycle: Secondary | ICD-10-CM | POA: Insufficient documentation

## 2020-06-29 DIAGNOSIS — D500 Iron deficiency anemia secondary to blood loss (chronic): Secondary | ICD-10-CM | POA: Insufficient documentation

## 2020-06-29 LAB — TYPE AND SCREEN
ABO/RH(D): A POS
Antibody Screen: NEGATIVE
Unit division: 0
Unit division: 0
Unit division: 0

## 2020-06-29 LAB — BPAM RBC
Blood Product Expiration Date: 202205032359
Blood Product Expiration Date: 202205032359
Blood Product Expiration Date: 202205032359
ISSUE DATE / TIME: 202204072006
ISSUE DATE / TIME: 202204080031
ISSUE DATE / TIME: 202204080328
Unit Type and Rh: 6200
Unit Type and Rh: 6200
Unit Type and Rh: 6200

## 2020-07-01 ENCOUNTER — Telehealth: Payer: Self-pay | Admitting: Hematology and Oncology

## 2020-07-01 ENCOUNTER — Encounter: Payer: Self-pay | Admitting: Hematology and Oncology

## 2020-07-01 ENCOUNTER — Inpatient Hospital Stay: Payer: BC Managed Care – PPO | Attending: Hematology and Oncology | Admitting: Hematology and Oncology

## 2020-07-01 ENCOUNTER — Other Ambulatory Visit: Payer: Self-pay

## 2020-07-01 ENCOUNTER — Inpatient Hospital Stay: Payer: BC Managed Care – PPO

## 2020-07-01 VITALS — BP 145/83 | HR 78 | Temp 99.1°F | Resp 20 | Wt 254.9 lb

## 2020-07-01 DIAGNOSIS — D500 Iron deficiency anemia secondary to blood loss (chronic): Secondary | ICD-10-CM | POA: Diagnosis not present

## 2020-07-01 DIAGNOSIS — Z7289 Other problems related to lifestyle: Secondary | ICD-10-CM | POA: Insufficient documentation

## 2020-07-01 DIAGNOSIS — K921 Melena: Secondary | ICD-10-CM | POA: Diagnosis not present

## 2020-07-01 DIAGNOSIS — N920 Excessive and frequent menstruation with regular cycle: Secondary | ICD-10-CM | POA: Insufficient documentation

## 2020-07-01 DIAGNOSIS — Z8249 Family history of ischemic heart disease and other diseases of the circulatory system: Secondary | ICD-10-CM | POA: Insufficient documentation

## 2020-07-01 DIAGNOSIS — Z79899 Other long term (current) drug therapy: Secondary | ICD-10-CM | POA: Insufficient documentation

## 2020-07-01 DIAGNOSIS — D75838 Other thrombocytosis: Secondary | ICD-10-CM | POA: Insufficient documentation

## 2020-07-01 DIAGNOSIS — G2581 Restless legs syndrome: Secondary | ICD-10-CM

## 2020-07-01 DIAGNOSIS — R0602 Shortness of breath: Secondary | ICD-10-CM

## 2020-07-01 DIAGNOSIS — R200 Anesthesia of skin: Secondary | ICD-10-CM | POA: Insufficient documentation

## 2020-07-01 DIAGNOSIS — R519 Headache, unspecified: Secondary | ICD-10-CM

## 2020-07-01 DIAGNOSIS — N921 Excessive and frequent menstruation with irregular cycle: Secondary | ICD-10-CM

## 2020-07-01 DIAGNOSIS — J45909 Unspecified asthma, uncomplicated: Secondary | ICD-10-CM

## 2020-07-01 DIAGNOSIS — R42 Dizziness and giddiness: Secondary | ICD-10-CM | POA: Insufficient documentation

## 2020-07-01 LAB — CBC
HCT: 27.8 % — ABNORMAL LOW (ref 36.0–46.0)
Hemoglobin: 7.8 g/dL — ABNORMAL LOW (ref 12.0–15.0)
MCH: 21.2 pg — ABNORMAL LOW (ref 26.0–34.0)
MCHC: 28.1 g/dL — ABNORMAL LOW (ref 30.0–36.0)
MCV: 75.5 fL — ABNORMAL LOW (ref 80.0–100.0)
Platelets: 450 10*3/uL — ABNORMAL HIGH (ref 150–400)
RBC: 3.68 MIL/uL — ABNORMAL LOW (ref 3.87–5.11)
RDW: 21.6 % — ABNORMAL HIGH (ref 11.5–15.5)
WBC: 7.4 10*3/uL (ref 4.0–10.5)
nRBC: 0.5 % — ABNORMAL HIGH (ref 0.0–0.2)

## 2020-07-01 LAB — FERRITIN: Ferritin: 11 ng/mL (ref 11–307)

## 2020-07-01 NOTE — Patient Instructions (Signed)

## 2020-07-01 NOTE — Progress Notes (Signed)
[facility]  [address], [address] [place], [place] [address] Phone: [phone]  Fax: [phone]   Clinic day:  07/01/2020  Chief Complaint: Joanne Hampton is a 50 y.o. female with iron deficiency anemia who is referred from Dr Enedina FinnerSona Patel for assessment and management.  HPI:  The patient notes that her menses has gotten heavier over the past year. Normally, she has one period each month. She has had two per month for the past 2-3 months. Her most recent period lasted two weeks (06/06/2020 - 06/21/2020). She uses 22-24 pads per cycle. Before, she only used half of that. She gets dizzy with her periods.  She was not aware that she was iron deficient. She eats chicken everyday and a hamburger every now and then. She does not eat dark green leafy vegetables. She had been eating ice for the past 5 years and has had restless legs for 3 years. She takes ferrous sulfate TID and is tolerating it well.  Iron makes her stools black.   She was [facility] to [facility] from 06/27/2020 - 06/28/2020.  She presented with facial numbness.  Hemoglobin was 4.2.  She denied any melena or hematochezia.  She described heavy periods.  Head MRI was negative.  She received 3 units of PRBCs.  She received Venofer 200 mg IV on 06/28/2020.  She was started on oral iron.  Discharge hemoglobin was 6.4.  She was not having menses at that time.    She is scheduled to follow-up with [facility].    Labs followed: 01/27/2007:  Hematocrit 35.1.  Hemoglobin 11.1.  MCV 96.4.  Platelets 333,000.  WBC 4700. 03/13/2010:  Hematocrit 28.4.  Hemoglobin   8.6.  MCV 86.3.  Platelets 410,000.  WBC 5700. 06/27/2020:  Hematocrit 17.0.  Hemoglobin   4.2.  MCV 70.0.  Platelets 587,000.  WBC 7600. 06/28/2020:  Hematocrit 22.6.  Hemoglobin   6.4.  MCV 73.6.  Platelets 429,000.  WBC 8500.  Additional labs on 06/27/2220: Ferritin 2 with an iron saturation of 2% and a TIBC of 624.  Retic 2.6%.  PT was 14.0 with  an INR of 1.1.  PTT 29. Folate 27.0.  Creatinine 0.61 on 06/28/2020.  Symptomatically, she has been "ok". Her face is numb, mostly on the right side. She had facial numbness a couple of years ago and saw neurology but cannot remember what caused it. She has headaches, shortness of breath, and asthma.  Her periods have gotten heavier over the past year. Normally, she has one period each month. She has had two per month for the past 2-3 months. Her most recent period lasted two weeks (06/06/2020 - 06/21/2020). She uses 22-24 pads per cycle. Before, she only used half of that. She gets dizzy with her periods.  She denies fevers, sweats, changes in vision, runny nose, sore throat, cough, chest pain, palpitations, nausea, vomiting, diarrhea, reflux, urinary symptoms, bone or joint symptoms, skin changes, weakness, balance or coordination problems.  She denies any nose bleeds, gum bleeds, hematochezia, or hematuria.  She has not had a colonoscopy. She does not remember if she had excessive bleeding with her C section.  She denies a family history of blood disorders or cancer.   Past Medical History:  Diagnosis Date  . Bronchitis     Past Surgical History:  Procedure Laterality Date  . CESAREAN SECTION  1990    Family History  Problem Relation Age of Onset  . Heart failure Mother  Social History:  reports that she has never smoked. She has never used smokeless tobacco. She reports current alcohol use. She reports that she does not use drugs. She denies any exposure to radiation or toxins. She drinks alcohol socially. 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 The patient is alone today.  Allergies: No Known Allergies  Current Medications: Current Outpatient Medications  Medication Sig Dispense Refill  . albuterol (VENTOLIN HFA) 108 (90 Base) MCG/ACT inhaler Inhale 2 puffs into the lungs every 6 (six) hours as needed for wheezing or shortness of breath. 1 each 1  . aspirin EC 81 MG tablet Take 81  mg by mouth daily. Swallow whole.    . ferrous sulfate 325 (65 FE) MG tablet Take 1 tablet (325 mg total) by mouth 3 (three) times daily with meals. 60 tablet 3  . Multiple Vitamin (MULTIVITAMIN) tablet Take 1 tablet by mouth daily.     No current facility-administered medications for this visit.    Review of Systems  Constitutional: Negative for chills, diaphoresis, fever, malaise/fatigue and weight loss.  HENT: Negative for congestion, ear discharge, ear pain, hearing loss, nosebleeds, sinus pain, sore throat and tinnitus.   Eyes: Negative for blurred vision.  Respiratory: Positive for shortness of breath. Negative for cough, hemoptysis and sputum production.        Asthma  Cardiovascular: Negative for chest pain, palpitations and leg swelling.  Gastrointestinal: Positive for melena (on oral iron). Negative for abdominal pain, blood in stool, constipation, diarrhea, heartburn, nausea and vomiting.       Ice pica.  Genitourinary: Negative for dysuria, frequency, hematuria and urgency.       Heavy periods x 1 year  Musculoskeletal: Negative for back pain, joint pain, myalgias and neck pain.  Skin: Negative for itching and rash.  Neurological: Positive for dizziness (with periods), sensory change (face numbness) and headaches. Negative for tingling and weakness.       Restless legs  Endo/Heme/Allergies: Does not bruise/bleed easily.  Psychiatric/Behavioral: Negative for depression and memory loss. The patient is not nervous/anxious and does not have insomnia.   All other systems reviewed and are negative.  Performance Status (ECOG): 1  Vital Signs Blood pressure (!) 145/83, pulse 78, temperature 99.1 F (37.3 C), resp. rate 20, weight 254 lb 13.6 oz (115.6 kg), SpO2 100 %.  Physical Exam Vitals and nursing note reviewed.  Constitutional:      General: She is not in acute distress.    Appearance: She is not diaphoretic.  HENT:     Head: Normocephalic and atraumatic.      Mouth/Throat:     Mouth: Mucous membranes are moist.     Pharynx: Oropharynx is clear.  Eyes:     Extraocular Movements: Extraocular movements intact.     Conjunctiva/sclera: Conjunctivae normal.     Pupils: Pupils are equal, round, and reactive to light.  Cardiovascular:     Rate and Rhythm: Normal rate and regular rhythm.     Heart sounds: Normal heart sounds. No murmur heard.   Pulmonary:     Effort: Pulmonary effort is normal.     Breath sounds: Normal breath sounds.  Chest:  Breasts:     Right: No axillary adenopathy or supraclavicular adenopathy.     Left: No axillary adenopathy or supraclavicular adenopathy.    Abdominal:     General: Bowel sounds are normal. There is no distension.     Palpations: Abdomen is soft. There is no mass.     Tenderness: There is  no abdominal tenderness. There is no guarding or rebound.  Musculoskeletal:        General: No swelling. Normal range of motion.     Cervical back: Normal range of motion and neck supple.  Lymphadenopathy:     Head:     Right side of head: No preauricular, posterior auricular or occipital adenopathy.     Left side of head: No preauricular, posterior auricular or occipital adenopathy.     Cervical: No cervical adenopathy.     Upper Body:     Right upper body: No supraclavicular or axillary adenopathy.     Left upper body: No supraclavicular or axillary adenopathy.     Lower Body: No right inguinal adenopathy. No left inguinal adenopathy.  Skin:    General: Skin is warm and dry.     Findings: No bruising.  Neurological:     Mental Status: She is alert and oriented to person, place, and time.  Psychiatric:        Behavior: Behavior normal.        Thought Content: Thought content normal.        Judgment: Judgment normal.    Appointment on 07/01/2020  Component Date Value Ref Range Status  . WBC 07/01/2020 7.4  4.0 - 10.5 K/uL Final  . RBC 07/01/2020 3.68* 3.87 - 5.11 MIL/uL Final  . Hemoglobin 07/01/2020 7.8*  12.0 - 15.0 g/dL Final  . HCT 62/83/6629 27.8* 36.0 - 46.0 % Final  . MCV 07/01/2020 75.5* 80.0 - 100.0 fL Final  . MCH 07/01/2020 21.2* 26.0 - 34.0 pg Final  . MCHC 07/01/2020 28.1* 30.0 - 36.0 g/dL Final  . RDW 47/65/4650 21.6* 11.5 - 15.5 % Final  . Platelets 07/01/2020 450* 150 - 400 K/uL Final  . nRBC 07/01/2020 0.5* 0.0 - 0.2 % Final   Performed at [facility], [address], [place], [place] [address]    Assessment:  Joanne Hampton is a 50 y.o. female with iron deficiency anemia.  She has menorrhagia.  Diet appears fairly good.  Labs on 06/27/2020 revealed a hematocrit 17.0, hemoglobin 4.2, MCV 70.0, platelets 587,000, and WBC 7600.  Ferritin was 2 with an iron saturation of 2% and a TIBC of 624.  Retic 2.6%.  PT was 14.0 with an INR of 1.1.  PTT 29.  Folate 27.0.  Creatinine 0.61 on 06/28/2020.  She has never had a colonoscopy.  She was admitted to [facility] from 06/27/2020 - 06/28/2020 with a hemoglobin of 4.2.  She received 3 units of PRBCs.  She received Venofer 200 mg IV on 06/28/2020.  She was discharged on oral iron.  Discharge hemoglobin was 6.4.  She was not having menses.    Symptomatically, she notes heavy menses.  She denies any other bleeding. She has ice pica.  The right side of her face is numb. She had facial numbness a couple of years ago and saw neurology. Exam is unremarkable.  Plan: 1.   Labs today:  CBC with diff, ferritin.  2.   Iron deficiency anemia  Etiology of iron deficiency appears related to heavy menses.  She is currently on oral iron.  She received Venofer while hospitalized.  Discuss consideration of IV iron if unable to replete iron stores with oral iron.   Potential side effects of IV iron reviewed.   Information provided.   Patient in agreement.  Check CBC in 1 week to confirm increasing hemoglobin.   If hemoglobin does not improve, begin weekly Venofer.  Discuss importance of colonoscopy as patient > 50. 3.   Thrombocytosis,  improving  Platelet count was 587,000 on 06/27/2020 then 429,000 on 06/28/2020.  Patient has reactive thrombocytosis secondary to iron deficiency.  Anticipate improvement/normalization in platelet count as anemia/iron deficiency resolves.  4.   Menorrhagia  Patient has heavy menses.  Patient gives no history of a bleeding diathesis.   Consider limited work-up (PT, PTT, von Willebrand panel).  Follow-up as scheduled with gynecology. 5.   Right facial numbness  Etiology unclear.  Discuss follow-up with neurology. 6.   Preauth Venofer. 7.   RTC in 1 week for labs (CBC). 8.   RTC in 4 weeks for MD assessment, labs (CBC with diff, ferritin, iron studies) and +/- Venofer.  I discussed the assessment and treatment plan with the patient.  The patient was provided an opportunity to ask questions and all were answered.  The patient agreed with the plan and demonstrated an understanding of the instructions.  The patient was advised to call back or seek an in person evaluation if the symptoms worsen or if the condition fails to improve as anticipated.  I provided 19 minutes of face-to-face time during this this encounter and > 50% was spent counseling as documented under my assessment and plan.  An additional 10-12 minutes were spent reviewing her chart (Epic and Care Everywhere) including notes, labs, and imaging studies.    Rosey Bath, MD, PhD 07/01/2020, 5:00 PM  I, Pietro Cassis, am acting as Neurosurgeon for [employer]. Merlene Pulling, MD, PhD.  I, Mery Guadalupe C. Merlene Pulling, MD, have reviewed the above documentation for accuracy and completeness, and I agree with the above.

## 2020-07-01 NOTE — Telephone Encounter (Signed)
Left patient detailed VM to contact office to schedule follow-up appts requested by Dr. Merlene Pulling

## 2020-07-01 NOTE — Progress Notes (Signed)
Patient here for initial visit she reports that last Wednesday she experienced numbness on the right side of her face, her PCP determined that it might be caused from anemia.

## 2020-07-02 ENCOUNTER — Other Ambulatory Visit: Payer: BC Managed Care – PPO

## 2020-07-04 ENCOUNTER — Ambulatory Visit (INDEPENDENT_AMBULATORY_CARE_PROVIDER_SITE_OTHER): Payer: BC Managed Care – PPO | Admitting: Student in an Organized Health Care Education/Training Program

## 2020-07-04 ENCOUNTER — Telehealth: Payer: Self-pay | Admitting: Student in an Organized Health Care Education/Training Program

## 2020-07-04 ENCOUNTER — Other Ambulatory Visit: Payer: Self-pay

## 2020-07-04 VITALS — BP 118/62 | HR 89 | Ht 62.0 in | Wt 254.2 lb

## 2020-07-04 DIAGNOSIS — D649 Anemia, unspecified: Secondary | ICD-10-CM | POA: Diagnosis not present

## 2020-07-04 DIAGNOSIS — Z1211 Encounter for screening for malignant neoplasm of colon: Secondary | ICD-10-CM

## 2020-07-04 DIAGNOSIS — Z0000 Encounter for general adult medical examination without abnormal findings: Secondary | ICD-10-CM | POA: Diagnosis not present

## 2020-07-04 DIAGNOSIS — Z1231 Encounter for screening mammogram for malignant neoplasm of breast: Secondary | ICD-10-CM | POA: Diagnosis not present

## 2020-07-04 NOTE — Progress Notes (Signed)
   SUBJECTIVE:   CHIEF COMPLAINT / HPI: hosp f/u  hosp f/u- d/c 4/7 for symptomatic anemia Starts back to work on Monday. Had some vaginal spotting yesterday but nothing continuous. Denies any GI bleeding.  Having some residual numbness on R cheek. Varies in severity but overall improved.  Taking iron TID every day No constipation.  Gyn apt scheduled 4/21 Plans to get pap at gyn. In agreeance with completing other cancer screenings due.  OBJECTIVE:   BP 118/62   Pulse 89   Ht 5\' 2"  (1.575 m)   Wt 254 lb 3.2 oz (115.3 kg)   LMP 06/23/2020 (Approximate)   SpO2 99%   BMI 46.49 kg/m   Physical Exam Vitals and nursing note reviewed.  Constitutional:      General: She is not in acute distress.    Appearance: She is obese. She is not ill-appearing, toxic-appearing or diaphoretic.  HENT:     Head: Normocephalic.     Mouth/Throat:     Mouth: Mucous membranes are moist. Mucous membranes are pale.  Eyes:     Extraocular Movements:     Left eye: Left eye abnormal extraocular motion: conjunctiva pallor.     Comments: conjunctiva pallor  Cardiovascular:     Rate and Rhythm: Normal rate and regular rhythm.     Pulses: Normal pulses.     Heart sounds: Normal heart sounds. No murmur heard.   Pulmonary:     Effort: Pulmonary effort is normal.     Breath sounds: Normal breath sounds.  Abdominal:     Tenderness: There is no abdominal tenderness.  Skin:    Capillary Refill: Capillary refill takes less than 2 seconds.  Neurological:     General: No focal deficit present.     Mental Status: She is alert.     Cranial Nerves: No cranial nerve deficit.     Sensory: No sensory deficit (denies abnormal sensation to light touch of cheeks).  Psychiatric:        Mood and Affect: Mood normal.        Behavior: Behavior normal.      ASSESSMENT/PLAN:   Symptomatic anemia Not currently bleeding. Denies current symptoms F/u with heme/onc showed stable and improving Hgb earlier this  week, no need to repeat this close Continue with iron supplement Recommended constipation ppx F/u with gyn next week  Healthcare maintenance Mammogram and colonoscopy ordered today Patient given instructions to complete Discussed having pap smear along with her pelvic exam at gyn next week and have records sent to 08/23/2020, and if not, she will come back to get it here.     [place], DO [facility]

## 2020-07-04 NOTE — Telephone Encounter (Signed)
[facility] Placard form dropped off for at front desk for completion.  Verified that patient section of form has been completed.  Last DOS/WCC with PCP was 07/04/20.  Placed form in team folder to be completed by clinical staff.  Joanne Hampton

## 2020-07-04 NOTE — Telephone Encounter (Signed)
Reviewed form and placed in PCP's box for completion.  .Mendel Binsfeld R Relda Agosto, CMA  

## 2020-07-04 NOTE — Patient Instructions (Signed)
It was a pleasure to see you today!  To summarize our discussion for this visit:  I'm glad to hear that you are doing well.   For my part in your recovery:  I have referred to GI for a colonoscopy  Please complete a mammogram in near future  See if you can get pap smear at your gyn appointment next week and have them send the records to [place]. If not, we can make an appointment to do it here soon.  I would highly recommend taking miralax and/or fiber supplement daily to help prevent constipation with all the iron you're taking.   Some additional health maintenance measures we should update are: Health Maintenance Due  Topic Date Due  . Hepatitis C Screening  Never done  . COVID-19 Vaccine (1) Never done  . COLONOSCOPY (Pts 45-33yrs Insurance coverage will need to be confirmed)  Never done  . PAP SMEAR-Modifier  01/31/2018  . MAMMOGRAM  Never done  .    Call the clinic at [phone] if your symptoms worsen or you have any concerns.   Thank you for allowing me to take part in your care,  Dr. Jamelle Rushing

## 2020-07-08 NOTE — Assessment & Plan Note (Signed)
Mammogram and colonoscopy ordered today Patient given instructions to complete Discussed having pap smear along with her pelvic exam at gyn next week and have records sent to [place], and if not, she will come back to get it here.

## 2020-07-08 NOTE — Assessment & Plan Note (Addendum)
Not currently bleeding. Denies current symptoms F/u with heme/onc showed stable and improving Hgb earlier this week, no need to repeat this close Continue with iron supplement Recommended constipation ppx F/u with gyn next week

## 2020-07-09 ENCOUNTER — Inpatient Hospital Stay: Payer: BC Managed Care – PPO

## 2020-07-09 ENCOUNTER — Other Ambulatory Visit: Payer: Self-pay

## 2020-07-09 DIAGNOSIS — D500 Iron deficiency anemia secondary to blood loss (chronic): Secondary | ICD-10-CM

## 2020-07-09 LAB — CBC
HCT: 33.2 % — ABNORMAL LOW (ref 36.0–46.0)
Hemoglobin: 9.5 g/dL — ABNORMAL LOW (ref 12.0–15.0)
MCH: 23.3 pg — ABNORMAL LOW (ref 26.0–34.0)
MCHC: 28.6 g/dL — ABNORMAL LOW (ref 30.0–36.0)
MCV: 81.6 fL (ref 80.0–100.0)
Platelets: 340 10*3/uL (ref 150–400)
RBC: 4.07 MIL/uL (ref 3.87–5.11)
RDW: 29 % — ABNORMAL HIGH (ref 11.5–15.5)
WBC: 6.9 10*3/uL (ref 4.0–10.5)
nRBC: 0 % (ref 0.0–0.2)

## 2020-07-10 ENCOUNTER — Telehealth: Payer: Self-pay

## 2020-07-10 NOTE — Telephone Encounter (Signed)
[facility] informing pt that form was ready for pickup at the front desk.  Copy placed in batch scanning. Jone Baseman, CMA

## 2020-07-10 NOTE — Telephone Encounter (Signed)
Pt notified of improvement. Will RTC on 5/11 for F/U.

## 2020-07-10 NOTE — Telephone Encounter (Signed)
Contacted pt and made her aware of Hgb improvement. Pt is satisfied, will RTC on 07/31/2020 for F/U.

## 2020-07-10 NOTE — Telephone Encounter (Signed)
-----   Message from Rosey Bath, MD sent at 07/10/2020  1:11 PM EDT ----- Regarding: RE: lab result  Please call patient.  Review improvement in HCT/Hgb in last 2 blood draws.  M  ----- Message ----- From: Lesle Chris, RN Sent: 07/10/2020   9:45 AM EDT To: Rosey Bath, MD Subject: FW: lab result                                  ----- Message ----- From: Lane Hacker Sent: 07/09/2020   3:55 PM EDT To: Guerry Minors, CMA, Roxie Gevena Barre, RN, # Subject: lab result                                     Pt would like a call with lab results.   Thanks

## 2020-07-11 ENCOUNTER — Encounter: Payer: Self-pay | Admitting: Obstetrics and Gynecology

## 2020-07-11 ENCOUNTER — Ambulatory Visit (INDEPENDENT_AMBULATORY_CARE_PROVIDER_SITE_OTHER): Payer: BC Managed Care – PPO | Admitting: Obstetrics and Gynecology

## 2020-07-11 ENCOUNTER — Other Ambulatory Visit (HOSPITAL_COMMUNITY)
Admission: RE | Admit: 2020-07-11 | Discharge: 2020-07-11 | Disposition: A | Discharge status: Home / Self Care | Payer: BC Managed Care – PPO | Source: Ambulatory Visit | Attending: Obstetrics and Gynecology | Admitting: Obstetrics and Gynecology

## 2020-07-11 ENCOUNTER — Other Ambulatory Visit: Payer: Self-pay

## 2020-07-11 VITALS — BP 140/75 | Ht 62.0 in | Wt 254.0 lb

## 2020-07-11 DIAGNOSIS — Z8619 Personal history of other infectious and parasitic diseases: Secondary | ICD-10-CM | POA: Diagnosis not present

## 2020-07-11 DIAGNOSIS — R87618 Other abnormal cytological findings on specimens from cervix uteri: Secondary | ICD-10-CM

## 2020-07-11 DIAGNOSIS — D500 Iron deficiency anemia secondary to blood loss (chronic): Secondary | ICD-10-CM | POA: Insufficient documentation

## 2020-07-11 DIAGNOSIS — Z113 Encounter for screening for infections with a predominantly sexual mode of transmission: Secondary | ICD-10-CM | POA: Insufficient documentation

## 2020-07-11 DIAGNOSIS — N921 Excessive and frequent menstruation with irregular cycle: Secondary | ICD-10-CM | POA: Diagnosis not present

## 2020-07-11 DIAGNOSIS — R87810 Cervical high risk human papillomavirus (HPV) DNA test positive: Secondary | ICD-10-CM | POA: Diagnosis not present

## 2020-07-11 DIAGNOSIS — N9489 Other specified conditions associated with female genital organs and menstrual cycle: Secondary | ICD-10-CM

## 2020-07-11 NOTE — Progress Notes (Signed)
Obstetrics & Gynecology Office Visit   Chief Complaint  Patient presents with  . Follow-up    ER Follow Up   . Menstrual Problem    History of Present Illness: 50 y.o. [id number] female who presents in follow up from an admission to [facility] on 06/27/2020, where she presented with facial numbness (negative workup) and was found to be anemic to a hemoglobin of 4.2. She underwent a transfusion with pRBCs (three units).   For about the past year she had been having regular, monthly periods. About two months ago she started having more frequent periods. She has been passing blood clots and lasting about two weeks.  Her last pap smear was in 2016 and this showed normal cells and positive HPV (negative for HPV 16 and 18). She also had trichomonas noted. She states she was treated for trichomonas.  She was, however, tested for trichomonas in 2019 and this was positive.    She denies unintentional weight loss.  She denies constipation, bloating.  Occasional early satiety.   She had a pelvic ultrasound which showed (per report):  FINDINGS: Uterus  Measurements: 13.8 x 7.9 x 10.2 cm = volume: 582 mL. 3.4 x 3.3 x 3.2 cm intramural mass in the left lateral aspect of the mid upper uterine body.  Endometrium  Thickness: 12 mm. Discrete 10 mm nodular echogenic focus in the endometrium with a small amount of vascularity at its base.  Right ovary  Not visualized.  Left ovary  Not visualized.  Other findings  No abnormal free fluid.  IMPRESSION: 1. 10 mm nodular echogenic focus in the endometrium which may reflect a small polyp. Consider further evaluation with sonohysterogram for confirmation prior to hysteroscopy. Endometrial sampling should also be considered if patient is at high risk for endometrial carcinoma. (Ref: Radiological Reasoning: Algorithmic Workup of Abnormal Vaginal Bleeding with Endovaginal Sonography and Sonohysterography. AJR 2008; [id number]:[id number]) 2. 3.4 cm uterine  fibroid. 3. Nonvisualization of the ovaries.  She has a history of bronchitis. She takes medication.  She states that this was a one-time issue.    Past Medical History:  Diagnosis Date  . Bronchitis   . Menorrhagia    Past Surgical History:  Procedure Laterality Date  . CESAREAN SECTION  1990  . TUBAL LIGATION     Gynecologic History: Patient's last menstrual period was 06/23/2020 (approximate).  Obstetric History: [id number], s/p c-section x 1  Family History  Problem Relation Age of Onset  . Heart failure Mother     Social History   Socioeconomic History  . Marital status: Divorced    Spouse name: Not on file  . Number of children: Not on file  . Years of education: Not on file  . Highest education level: Not on file  Occupational History  . Not on file  Tobacco Use  . Smoking status: Never Smoker  . Smokeless tobacco: Never Used  Substance and Sexual Activity  . Alcohol use: Yes    Comment: occasional  . Drug use: No  . Sexual activity: Not Currently    Birth control/protection: Surgical  Other Topics Concern  . Not on file  Social History Narrative  . Not on file   Social Determinants of Health   Financial Resource Strain: Not on file  Food Insecurity: Not on file  Transportation Needs: Not on file  Physical Activity: Not on file  Stress: Not on file  Social Connections: Not on file  Intimate Partner Violence: Not on file    No Known  Allergies  Prior to Admission medications   Medication Sig Start Date End Date Taking? Authorizing Provider  albuterol (VENTOLIN HFA) 108 (90 Base) MCG/ACT inhaler Inhale 2 puffs into the lungs every 6 (six) hours as needed for wheezing or shortness of breath. 03/11/20  Yes Anderson, Chelsey L, DO  aspirin EC 81 MG tablet Take 81 mg by mouth daily. Swallow whole.   Yes [provider]  ferrous sulfate 325 (65 FE) MG tablet Take 1 tablet (325 mg total) by mouth 3 (three) times daily with meals. 06/28/20  Yes Enedina Finner, MD  Multiple Vitamin (MULTIVITAMIN) tablet Take 1 tablet by mouth daily.   Yes [provider]    Review of Systems  Constitutional: Negative.   HENT: Negative.   Eyes: Negative.   Respiratory: Negative.   Cardiovascular: Negative.   Gastrointestinal: Negative.   Genitourinary: Negative.   Musculoskeletal: Negative.   Skin: Negative.   Neurological: Negative.   Psychiatric/Behavioral: Negative.      Physical Exam BP 140/75   Ht 5\' 2"  (1.575 m)   Wt 254 lb (115.2 kg)   LMP 06/23/2020 (Approximate)   BMI 46.46 kg/m  Patient's last menstrual period was 06/23/2020 (approximate). Physical Exam Constitutional:      General: She is not in acute distress.    Appearance: Normal appearance. She is well-developed.  Genitourinary:     Vulva, bladder and urethral meatus normal.     Right Labia: No rash, tenderness, lesions, skin changes or Bartholin's cyst.    Left Labia: No tenderness, skin changes, Bartholin's cyst or rash.    No inguinal adenopathy present in the right or left side.    Pelvic Tanner Score: 5/5.     Right Adnexa: not tender, not full and no mass present.    Left Adnexa: not tender, not full and no mass present.    No cervical motion tenderness, friability, lesion or polyp.     Uterus is not enlarged, fixed or tender.     Uterus is anteverted.     No urethral tenderness or mass present.     Pelvic exam was performed with patient in the lithotomy position.  HENT:     Head: Normocephalic and atraumatic.  Eyes:     General: No scleral icterus.    Conjunctiva/sclera: Conjunctivae normal.  Cardiovascular:     Rate and Rhythm: Normal rate and regular rhythm.     Heart sounds: No murmur heard. No friction rub. No gallop.   Pulmonary:     Effort: Pulmonary effort is normal. No respiratory distress.     Breath sounds: Normal breath sounds. No wheezing or rales.  Abdominal:     General: Bowel sounds are normal. There is no distension.     Palpations:  Abdomen is soft. There is no mass.     Tenderness: There is no abdominal tenderness. There is no guarding or rebound.     Hernia: There is no hernia in the left inguinal area or right inguinal area.  Musculoskeletal:        General: Normal range of motion.     Cervical back: Normal range of motion and neck supple.  Lymphadenopathy:     Lower Body: No right inguinal adenopathy. No left inguinal adenopathy.  Neurological:     General: No focal deficit present.     Mental Status: She is alert and oriented to person, place, and time.     Cranial Nerves: No cranial nerve deficit.  Skin:  General: Skin is warm and dry.     Findings: No erythema.  Psychiatric:        Mood and Affect: Mood normal.        Behavior: Behavior normal.        Judgment: Judgment normal.    Endometrial Biopsy After discussion with the patient regarding her abnormal uterine bleeding I recommended that she proceed with an endometrial biopsy for further diagnosis. The risks, benefits, alternatives, and indications for an endometrial biopsy were discussed with the patient in detail. She understood the risks including infection, bleeding, cervical laceration and uterine perforation.  Verbal consent was obtained.   PROCEDURE NOTE:  Pipelle endometrial biopsy was performed using aseptic technique with iodine preparation.  The uterus was sounded to a length of 8 cm.  Adequate sampling was obtained with minimal blood loss.  The patient tolerated the procedure well.  Disposition will be pending pathology.  Female chaperone present for pelvic and breast  portions of the physical exam  Assessment: 50 y.o. [id number] female here for  1. Menorrhagia with irregular cycle   2. Iron deficiency anemia due to chronic blood loss   3. Abnormal Papanicolaou smear of cervix with positive human papilloma virus (HPV) test   4. History of trichomonal vaginitis   5. Screen for STD (sexually transmitted disease)   6. Endometrial mass       Plan: Problem List Items Addressed This Visit      Other   Iron deficiency anemia due to chronic blood loss   Relevant Orders   Surgical pathology   Menorrhagia with irregular cycle - Primary   Relevant Orders   Cytology - PAP   Surgical pathology    Other Visit Diagnoses    Abnormal Papanicolaou smear of cervix with positive human papilloma virus (HPV) test       Relevant Orders   Cytology - PAP   Surgical pathology   History of trichomonal vaginitis       Relevant Orders   Cytology - PAP   Screen for STD (sexually transmitted disease)       Relevant Orders   Cytology - PAP   Surgical pathology   Endometrial mass         Discussed management options for abnormal uterine bleeding including expectant, NSAIDs, tranexamic acid (Lysteda), oral progesterone (Provera, norethindrone, megace), Depo Provera, Levonorgestrel containing IUD, endometrial ablation (Novasure) or hysterectomy as definitive surgical management.  Discussed risks and benefits of each method.   Final management decision will hinge on results of patient's work up and whether an underlying etiology for the patients bleeding symptoms can be discerned.  We will conduct a basic work up examining using the PALM-COIEN classification system.  In the meantime the patient opts to trial no medication while we await results of her endometrial biopsy and pap smear. Bleeding precautions reviewed.   Given her findings on ultrasound, fibroids and/or her polyp appear to be the most obvious etiology for her bleeding.  However, she was profoundly anemic and neoplasia must be eliminated as a possibility.  Discussed various treatment strategies.  We will make a final decision once her labs return.  We discussed using some medication for heavy bleeding should it return prior to a final decision being made.  A total of 49 minutes were spent face-to-face with the patient as well as preparation, review of her hospital notes, labs and images,  communication, and documentation during this encounter.    Thomasene Mohair, MD 07/11/2020 11:07 AM

## 2020-07-15 LAB — SURGICAL PATHOLOGY

## 2020-07-16 ENCOUNTER — Encounter: Payer: Self-pay | Admitting: Obstetrics and Gynecology

## 2020-07-16 LAB — CYTOLOGY - PAP
Chlamydia: NEGATIVE
Comment: NEGATIVE
Comment: NEGATIVE
Comment: NEGATIVE
Comment: NORMAL
High risk HPV: POSITIVE — AB
Neisseria Gonorrhea: NEGATIVE
Trichomonas: POSITIVE — AB

## 2020-07-23 ENCOUNTER — Telehealth: Payer: Self-pay | Admitting: Obstetrics and Gynecology

## 2020-07-23 NOTE — Telephone Encounter (Signed)
Left generic VM to call me back. Will discuss pap and trichomonas

## 2020-07-24 NOTE — Telephone Encounter (Signed)
Pt returned the call.

## 2020-07-25 ENCOUNTER — Other Ambulatory Visit: Payer: Self-pay | Admitting: Obstetrics and Gynecology

## 2020-07-25 DIAGNOSIS — A599 Trichomoniasis, unspecified: Secondary | ICD-10-CM

## 2020-07-25 MED ORDER — METRONIDAZOLE 500 MG PO TABS: 2000.0000 mg | ORAL_TABLET | Freq: Once | ORAL | 0 refills | Status: AC

## 2020-07-25 NOTE — Telephone Encounter (Signed)
Discussed findings on Pap smear of L SIL with HPV positive.  I recommended a colposcopy to further investigate.  We discussed that this is how cancer is prevented and without this procedure she runs a risk of going on to getting cervical cancer.  She voiced understanding and agreement to proceed.  We discussed finding of trichomonas on her Pap smear.  Discussed that she and her partner both need to be treated and that they would need to refrain from intercourse for 7 days to affect full treatment.  She voiced understanding of this.  Will discuss next steps to remove the polyp and may be able to do a procedure that is all inclusive of polypectomy and placement of an IUD to reduce bleeding.  We will discuss further at her colposcopy visit.

## 2020-07-30 ENCOUNTER — Other Ambulatory Visit: Payer: Self-pay

## 2020-07-30 DIAGNOSIS — D500 Iron deficiency anemia secondary to blood loss (chronic): Secondary | ICD-10-CM

## 2020-07-31 ENCOUNTER — Inpatient Hospital Stay: Payer: Self-pay | Attending: Oncology

## 2020-07-31 ENCOUNTER — Inpatient Hospital Stay (HOSPITAL_BASED_OUTPATIENT_CLINIC_OR_DEPARTMENT_OTHER): Payer: Self-pay | Admitting: Oncology

## 2020-07-31 ENCOUNTER — Other Ambulatory Visit: Payer: Self-pay

## 2020-07-31 ENCOUNTER — Inpatient Hospital Stay: Payer: Self-pay

## 2020-07-31 VITALS — BP 128/75 | HR 66 | Temp 97.5°F | Resp 18 | Wt 251.3 lb

## 2020-07-31 DIAGNOSIS — R87820 Cervical low risk human papillomavirus (HPV) DNA test positive: Secondary | ICD-10-CM | POA: Insufficient documentation

## 2020-07-31 DIAGNOSIS — N921 Excessive and frequent menstruation with irregular cycle: Secondary | ICD-10-CM

## 2020-07-31 DIAGNOSIS — Z8249 Family history of ischemic heart disease and other diseases of the circulatory system: Secondary | ICD-10-CM | POA: Insufficient documentation

## 2020-07-31 DIAGNOSIS — Z79899 Other long term (current) drug therapy: Secondary | ICD-10-CM | POA: Insufficient documentation

## 2020-07-31 DIAGNOSIS — D75838 Other thrombocytosis: Secondary | ICD-10-CM | POA: Insufficient documentation

## 2020-07-31 DIAGNOSIS — R200 Anesthesia of skin: Secondary | ICD-10-CM | POA: Insufficient documentation

## 2020-07-31 DIAGNOSIS — K921 Melena: Secondary | ICD-10-CM | POA: Insufficient documentation

## 2020-07-31 DIAGNOSIS — A599 Trichomoniasis, unspecified: Secondary | ICD-10-CM | POA: Insufficient documentation

## 2020-07-31 DIAGNOSIS — D500 Iron deficiency anemia secondary to blood loss (chronic): Secondary | ICD-10-CM | POA: Insufficient documentation

## 2020-07-31 DIAGNOSIS — N920 Excessive and frequent menstruation with regular cycle: Secondary | ICD-10-CM | POA: Insufficient documentation

## 2020-07-31 LAB — CBC WITH DIFFERENTIAL/PLATELET
Abs Immature Granulocytes: 0.02 10*3/uL (ref 0.00–0.07)
Basophils Absolute: 0 10*3/uL (ref 0.0–0.1)
Basophils Relative: 0 %
Eosinophils Absolute: 0 10*3/uL (ref 0.0–0.5)
Eosinophils Relative: 1 %
HCT: 32.5 % — ABNORMAL LOW (ref 36.0–46.0)
Hemoglobin: 9.8 g/dL — ABNORMAL LOW (ref 12.0–15.0)
Immature Granulocytes: 0 %
Lymphocytes Relative: 22 %
Lymphs Abs: 1.5 10*3/uL (ref 0.7–4.0)
MCH: 27.8 pg (ref 26.0–34.0)
MCHC: 30.2 g/dL (ref 30.0–36.0)
MCV: 92.3 fL (ref 80.0–100.0)
Monocytes Absolute: 0.4 10*3/uL (ref 0.1–1.0)
Monocytes Relative: 7 %
Neutro Abs: 4.6 10*3/uL (ref 1.7–7.7)
Neutrophils Relative %: 70 %
Platelets: 342 10*3/uL (ref 150–400)
RBC: 3.52 MIL/uL — ABNORMAL LOW (ref 3.87–5.11)
RDW: 27.6 % — ABNORMAL HIGH (ref 11.5–15.5)
WBC: 6.6 10*3/uL (ref 4.0–10.5)
nRBC: 0 % (ref 0.0–0.2)

## 2020-07-31 LAB — IRON AND TIBC
Iron: 34 ug/dL (ref 28–170)
Saturation Ratios: 8 % — ABNORMAL LOW (ref 10.4–31.8)
TIBC: 417 ug/dL (ref 250–450)
UIBC: 383 ug/dL

## 2020-07-31 LAB — FERRITIN: Ferritin: 28 ng/mL (ref 11–307)

## 2020-07-31 NOTE — Progress Notes (Signed)
[facility]  [address] [place], [place] [address] Phone: [phone]  Fax: [phone]   Clinic day:  07/31/2020  Chief Complaint: Joanne Hampton is a 50 y.o. female with iron deficiency anemia who is here for 26-month follow-up.    HPI: Patient was initially referred by Dr. Allena Katz for severe anemia with a hemoglobin of 4.2. She described heavy periods.  Head MRI was negative.  She received 3 units of PRBCs.  She received Venofer 200 mg IV on 06/28/2020.  She was started on oral iron.  Discharge hemoglobin was 6.4.  She was not having menses at that time.    She was referred to hematology for work-up.  She reported heavy menstrual cycles but denied ever being diagnosed with iron deficiency.  Her diet is normal.  Admitted to some ice pica.  She was tolerating ferrous sulfate 3 times daily.  Reported dark stools.  She reported some facial numbness associated with her iron deficiency.  She was referred to [facility].  She was seen on 07/11/2020 by Dr. Jean Rosenthal.  They proceeded with an endometrial biopsy which showed SIL with HPV positive.  He recommended a colposcopy for further work-up.  She was also found to be positive for trichomonas.  She and her partner were both treated.  Plan is to remove the known polyp and place her on an IUD to reduce bleeding.  Colposcopy is scheduled for 08/12/2020.  Today, she reports feeling better.  She denies any additional symptoms including shortness of breath or fatigue.  Her facial numbness has improved.  She has been compliant with her iron tablets and denies any GI upset.  Stools continue to be dark.   Past Medical History:  Diagnosis Date  . Bronchitis   . Menorrhagia     Past Surgical History:  Procedure Laterality Date  . CESAREAN SECTION  1990  . TUBAL LIGATION      Family History  Problem Relation Age of Onset  . Heart failure Mother     Social History:  reports that she has never smoked.  She has never used smokeless tobacco. She reports current alcohol use. She reports that she does not use drugs. She denies any exposure to radiation or toxins. She drinks alcohol socially. 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 The patient is alone today.  Allergies: No Known Allergies  Current Medications: Current Outpatient Medications  Medication Sig Dispense Refill  . albuterol (VENTOLIN HFA) 108 (90 Base) MCG/ACT inhaler Inhale 2 puffs into the lungs every 6 (six) hours as needed for wheezing or shortness of breath. 1 each 1  . aspirin EC 81 MG tablet Take 81 mg by mouth daily. Swallow whole.    . ferrous sulfate 325 (65 FE) MG tablet Take 1 tablet (325 mg total) by mouth 3 (three) times daily with meals. 60 tablet 3  . Multiple Vitamin (MULTIVITAMIN) tablet Take 1 tablet by mouth daily.     No current facility-administered medications for this visit.    Review of Systems  Constitutional: Negative.  Negative for chills, fever, malaise/fatigue and weight loss.  HENT: Negative for congestion, ear pain and tinnitus.   Eyes: Negative.  Negative for blurred vision and double vision.  Respiratory: Negative.  Negative for cough, sputum production and shortness of breath.   Cardiovascular: Negative.  Negative for chest pain, palpitations and leg swelling.  Gastrointestinal: Negative.  Negative for abdominal pain, constipation, diarrhea, nausea and vomiting.  Genitourinary: Negative for dysuria, frequency and urgency.  Musculoskeletal: Negative for back pain and falls.  Skin: Negative.  Negative for rash.  Neurological: Negative.  Negative for weakness and headaches.  Endo/Heme/Allergies: Negative.  Does not bruise/bleed easily.  Psychiatric/Behavioral: Negative.  Negative for depression. The patient is not nervous/anxious and does not have insomnia.    Performance Status (ECOG): 1  Vital Signs Weight 251 lb 5.2 oz (114 kg).  Physical Exam Vitals and nursing note reviewed.  Constitutional:       General: She is not in acute distress.    Appearance: She is not diaphoretic.  HENT:     Head: Normocephalic and atraumatic.     Mouth/Throat:     Mouth: Mucous membranes are moist.     Pharynx: Oropharynx is clear.  Eyes:     Extraocular Movements: Extraocular movements intact.     Conjunctiva/sclera: Conjunctivae normal.     Pupils: Pupils are equal, round, and reactive to light.  Cardiovascular:     Rate and Rhythm: Normal rate and regular rhythm.     Heart sounds: Normal heart sounds. No murmur heard.   Pulmonary:     Effort: Pulmonary effort is normal.     Breath sounds: Normal breath sounds.  Chest:  Breasts:     Right: No axillary adenopathy or supraclavicular adenopathy.     Left: No axillary adenopathy or supraclavicular adenopathy.    Abdominal:     General: Bowel sounds are normal. There is no distension.     Palpations: Abdomen is soft. There is no mass.     Tenderness: There is no abdominal tenderness. There is no guarding or rebound.  Musculoskeletal:        General: No swelling. Normal range of motion.     Cervical back: Normal range of motion and neck supple.  Lymphadenopathy:     Head:     Right side of head: No preauricular, posterior auricular or occipital adenopathy.     Left side of head: No preauricular, posterior auricular or occipital adenopathy.     Cervical: No cervical adenopathy.     Upper Body:     Right upper body: No supraclavicular or axillary adenopathy.     Left upper body: No supraclavicular or axillary adenopathy.     Lower Body: No right inguinal adenopathy. No left inguinal adenopathy.  Skin:    General: Skin is warm and dry.     Findings: No bruising.  Neurological:     Mental Status: She is alert and oriented to person, place, and time.  Psychiatric:        Behavior: Behavior normal.        Thought Content: Thought content normal.        Judgment: Judgment normal.    No visits with results within 3 Day(s) from this  visit.  Latest known visit with results is:  Office Visit on 07/11/2020  Component Date Value Ref Range Status  . High risk HPV 07/11/2020 Positive*  Final  . Neisseria Gonorrhea 07/11/2020 Negative   Final  . Chlamydia 07/11/2020 Negative   Final  . Trichomonas 07/11/2020 Positive*  Final  . Adequacy 07/11/2020 Satisfactory for evaluation; transformation zone component PRESENT.   Final  . Diagnosis 07/11/2020 - Low grade squamous intraepithelial lesion (LSIL)*  Final  . Microorganisms 07/11/2020 Trichomonas vaginalis present   Final  . Comment 07/11/2020 Normal Reference Range HPV - Negative   Final  . Comment 07/11/2020 Normal Reference Range Trichomonas - Negative   Final  . Comment 07/11/2020 Normal Reference  [place] Chlamydia - Negative   Final  . Comment 07/11/2020 Normal Reference Range Neisseria Gonorrhea - Negative   Final  . SURGICAL PATHOLOGY 07/11/2020    Final-Edited                   Value:SURGICAL PATHOLOGY CASE: [id number] [facility]     Clinical History: menorrhagia with irregular cycle, iron deficiency anemia due to chronic blood loss, abnormal pap smear with positive HPV, screen for STD (cm)     FINAL MICROSCOPIC DIAGNOSIS:  A. ENDOMETRIUM, BIOPSY: - Secretory endometrium. - No hyperplasia or carcinoma.   GROSS DESCRIPTION:  Received in formalin are tan, hemorrhagic soft tissue fragments that are entirely submitted. Volume: 2 x 2 x 0.6 cm. (2 B) (GRP 07/12/2020)     Final Diagnosis performed by Jimmy PicketJohn Patrick, MD.   Electronically signed 07/15/2020 Technical component performed at [facility], [address], [place], [place] [address].  Professional component performed at [facility], [address] [place], [place] [address].  Immunohistochemistry Technical component (if applicable) was performed at [facility]. [address], [place], [place] [address].   IMMUNOHISTOCHEMISTRY DISCLAIMER (if applicable): Some of these immunohistochemical stains may have been developed and the performance characteristics determine by [facility]. Some may not have been cleared or approved by the U.S. Food and Drug Administration. The FDA has determined that such clearance or approval is not necessary. This test is used for clinical purposes. It should not be regarded as investigational or for research. This laboratory is certified under the Clinical Laboratory Improvement Amendments of 1988 (CLIA-88) as qualified to perform high complexity clinical laboratory testing.  The controls stained appropriately.     Assessment:  Robinette HainesCharlene Hampton is a 50 y.o. female with iron deficiency anemia.  She has menorrhagia.  Diet appears fairly good.  Labs on 06/27/2020 revealed a hematocrit 17.0, hemoglobin 4.2, MCV 70.0, platelets 587,000, and WBC 7600.  Ferritin was 2 with an iron saturation of 2% and a TIBC of 624.  Retic 2.6%.  PT was 14.0 with an INR of 1.1.  PTT 29.  Folate 27.0.  Creatinine 0.61 on 06/28/2020.  She has never had a colonoscopy.  She was [facility] to [facility] from 06/27/2020 - 06/28/2020 with a hemoglobin of 4.2.  She received 3 units of PRBCs.  She received Venofer 200 mg IV on 06/28/2020.  She was discharged on oral iron.  Discharge hemoglobin was 6.4.  She was not having menses.    Symptomatically, she notes improving menstrual cycles since she was treated for trichomonas.  Facial numbness has improved.  She has a scheduled colposcopy in the next 1 to 2 weeks.  Plan: 1.   Labs today:  CBC with diff, ferritin.  2.   Iron deficiency anemia  Etiology of iron deficiency appears related to heavy menses-which has improved.  She is currently on oral iron TID with toleration.  She received Venofer while hospitalized.  Labs from 07/31/2020 show continued improvement of her hemoglobin (9.8).    Ferritin 28 and iron saturations 8%. 3.   Thrombocytosis, improving  Platelet count has improved and is normalized.  Labs from 07/09/2020 show a platelet count of 342,000.  Patient has reactive thrombocytosis secondary to iron deficiency.  4.   Menorrhagia  Patient  has heavy menses.  She was evaluated by [facility] and had a Pap smear which revealed trichomonas, HPV + and several polyps.  She was scheduled for a colposcopy.   She was treated for trichomonas with improvement of her menstrual cycles.  Possible IUD placement to help reduce bleeding.  5.   Right facial numbness  Improving with resolving anemia.   Disposition: No additional IV iron today. RTC in 3 months for labs only and in 6 months for labs and MD assessment. Continue oral iron.  Greater than 50% was spent in counseling and coordination of care with this patient including but not limited to discussion of the relevant topics above (See A&P) including, but not limited to diagnosis and management of acute and chronic medical conditions.   Durenda Hurt, NP 07/31/2020 2:05 PM

## 2020-08-06 ENCOUNTER — Other Ambulatory Visit: Payer: Self-pay

## 2020-08-06 ENCOUNTER — Ambulatory Visit
Admission: RE | Admit: 2020-08-06 | Discharge: 2020-08-06 | Disposition: A | Discharge status: Home / Self Care | Payer: Self-pay | Source: Ambulatory Visit | Attending: Oncology | Admitting: Oncology

## 2020-08-06 ENCOUNTER — Ambulatory Visit: Payer: Self-pay | Attending: Oncology

## 2020-08-06 VITALS — BP 138/57 | HR 70 | Temp 96.4°F | Ht 62.0 in | Wt 258.5 lb

## 2020-08-06 DIAGNOSIS — Z0000 Encounter for general adult medical examination without abnormal findings: Secondary | ICD-10-CM | POA: Insufficient documentation

## 2020-08-06 NOTE — Progress Notes (Signed)
  Subjective:     Patient ID: Joanne Hampton, female   DOB: 1970-11-27, 50 y.o.   MRN: [id number]  HPI   Review of Systems     Objective:   Physical Exam Chest:  Breasts:     Right: No swelling, bleeding, inverted nipple, mass, nipple discharge, skin change or tenderness.     Left: No swelling, bleeding, inverted nipple, mass, nipple discharge, skin change or tenderness.      Comments: Large pendulous breasts       Assessment:     50 year old patient presents for BCCCP clinic visit.  Referred by Dr. Jean Rosenthal at [facility] for LSIL pap results needing colposcopy.  She is scheduled for this procedure on 08/12/20 with Dr. Jean Rosenthal. Patient screened, and meets BCCCP eligibility.  Patient does not have insurance, Medicare or Medicaid. Instructed patient on breast self awareness using teach back method.  Clinical breast exam unremarkable.  No mass or lump palpated.   Risk Assessment    Risk Scores      08/06/2020   Last edited by: Jim Like, RN   5-year risk: 0.9 %   Lifetime risk: 6.6 %            Plan:       Sent for bilateral screening mammogram.

## 2020-08-12 ENCOUNTER — Ambulatory Visit: Payer: Self-pay | Admitting: Obstetrics and Gynecology

## 2020-08-12 ENCOUNTER — Telehealth: Payer: Self-pay | Admitting: *Deleted

## 2020-08-12 NOTE — Telephone Encounter (Signed)
Patient left vm on scheduling line requesting to verify where her appointment for today was.  She states she was at the [facility].  I see that her appointment with Dr. Jean Rosenthal at [facility] was canceled because she was late.  Routing to [employer] for rescheduling.

## 2020-08-12 NOTE — Progress Notes (Signed)
Letter mailed from [facility] to notify of normal mammogram results.  Patient to return in one year for annual screening.  Colposcopy results pending.  Cervical Biopsy CINI result.  Will follow per BCCCP guidelines, and Dr. Jean Rosenthal recommendations. Copy to [facility].

## 2020-09-02 ENCOUNTER — Other Ambulatory Visit (HOSPITAL_COMMUNITY)
Admission: RE | Admit: 2020-09-02 | Discharge: 2020-09-02 | Disposition: A | Discharge status: Home / Self Care | Payer: Self-pay | Source: Ambulatory Visit | Attending: Obstetrics and Gynecology | Admitting: Obstetrics and Gynecology

## 2020-09-02 ENCOUNTER — Ambulatory Visit (INDEPENDENT_AMBULATORY_CARE_PROVIDER_SITE_OTHER): Payer: Self-pay | Admitting: Obstetrics and Gynecology

## 2020-09-02 ENCOUNTER — Encounter: Payer: Self-pay | Admitting: Obstetrics and Gynecology

## 2020-09-02 ENCOUNTER — Other Ambulatory Visit: Payer: Self-pay

## 2020-09-02 VITALS — BP 130/70 | Ht 62.0 in | Wt 254.0 lb

## 2020-09-02 DIAGNOSIS — R87612 Low grade squamous intraepithelial lesion on cytologic smear of cervix (LGSIL): Secondary | ICD-10-CM | POA: Insufficient documentation

## 2020-09-02 DIAGNOSIS — A599 Trichomoniasis, unspecified: Secondary | ICD-10-CM | POA: Insufficient documentation

## 2020-09-02 NOTE — Progress Notes (Signed)
Referring Provider:  [facility]  HPI:  Joanne Hampton is a 50 y.o.  [id number]  who presents today for evaluation and management of abnormal cervical cytology.    Dysplasia History:  LGSIL, HPV+ on 4/41/2022   OB History  Gravida Para Term Preterm AB Living  5 4 4   1 4   SAB IAB Ectopic Multiple Live Births  1       4    # Outcome Date GA Lbr Len/2nd Weight Sex Delivery Anes PTL Lv  5 SAB           4 Term           3 Term           2 Term           1 Term             Past Medical History:  Diagnosis Date   Bronchitis    Menorrhagia     Past Surgical History:  Procedure Laterality Date   CESAREAN SECTION  1990   TUBAL LIGATION      SOCIAL HISTORY:  Social History   Substance and Sexual Activity  Alcohol Use Yes   Comment: occasional    Social History   Substance and Sexual Activity  Drug Use No     Family History  Problem Relation Age of Onset   Heart failure Mother     ALLERGIES:  Patient has no known allergies.  Current Outpatient Medications on File Prior to Visit  Medication Sig Dispense Refill   albuterol (VENTOLIN HFA) 108 (90 Base) MCG/ACT inhaler Inhale 2 puffs into the lungs every 6 (six) hours as needed for wheezing or shortness of breath. 1 each 1   aspirin EC 81 MG tablet Take 81 mg by mouth daily. Swallow whole.     ferrous sulfate 325 (65 FE) MG tablet Take 1 tablet (325 mg total) by mouth 3 (three) times daily with meals. 60 tablet 3   Multiple Vitamin (MULTIVITAMIN) tablet Take 1 tablet by mouth daily.     No current facility-administered medications on file prior to visit.    Physical Exam: -Vitals:  BP 130/70   Ht 5\' 2"  (1.575 m)   Wt 254 lb (115.2 kg)   LMP 08/12/2020 (Approximate)   BMI 46.46 kg/m  GEN: WD, WN, NAD.  A+ O x 3, good mood and affect. ABD:  NT, ND.  Soft, no masses.  No hernias noted.   Pelvic:   Vulva: Normal appearance.  No lesions.  Vagina: No lesions or abnormalities noted.  Support: Normal pelvic  support.  Urethra No masses tenderness or scarring.  Meatus Normal size without lesions or prolapse.  Cervix: See below.  Anus: Normal exam.  No lesions.  Perineum: Normal exam.  No lesions.        Bimanual   Uterus: Normal size.  Non-tender.  Mobile.  AV.  Adnexae: No masses.  Non-tender to palpation.  Cul-de-sac: Negative for abnormality.   PROCEDURE: 1.  Urine Pregnancy Test:  not done (s/p BTL) 2.  Colposcopy performed with 4% acetic acid after verbal consent obtained                                         -Aceto-white Lesions Location(s): anterior.              -Biopsy performed at 6 and  12 o'clock               -ECC indicated and performed: Yes.       -Biopsy sites made hemostatic with pressure, AgNO3, and/or Monsel's solution   -Satisfactory colposcopy: No.    -Evidence of Invasive cervical CA :  NO  ASSESSMENT:  Joanne Hampton is a 50 y.o. [id number] here for  1. LGSIL on Pap smear of cervix   .  PLAN: I discussed the grading system of pap smears and HPV high risk viral types.  We will discuss and base management after colpo results return.   We also discussed the polypoid lesion of her uterus noted on ultrasound.  Will use the results of this biopsy to determine the overall plan of care.   Test of cure for trichomonas today as she seems to have had this for a long time.       Thomasene Mohair, MD  [facility], [facility] 09/02/2020  8:43 AM   CC: Joanne Rud, RN [facility]

## 2020-09-03 LAB — CERVICOVAGINAL ANCILLARY ONLY
Comment: NEGATIVE
Trichomonas: NEGATIVE

## 2020-09-03 LAB — SURGICAL PATHOLOGY

## 2020-10-31 ENCOUNTER — Other Ambulatory Visit: Payer: Self-pay

## 2020-10-31 ENCOUNTER — Inpatient Hospital Stay: Payer: Self-pay | Attending: Oncology | Admitting: Oncology

## 2020-10-31 ENCOUNTER — Telehealth: Payer: Self-pay

## 2020-10-31 DIAGNOSIS — N921 Excessive and frequent menstruation with irregular cycle: Secondary | ICD-10-CM | POA: Insufficient documentation

## 2020-10-31 DIAGNOSIS — D500 Iron deficiency anemia secondary to blood loss (chronic): Secondary | ICD-10-CM | POA: Insufficient documentation

## 2020-10-31 LAB — CBC WITH DIFFERENTIAL/PLATELET
Abs Immature Granulocytes: 0.01 10*3/uL (ref 0.00–0.07)
Basophils Absolute: 0 10*3/uL (ref 0.0–0.1)
Basophils Relative: 0 %
Eosinophils Absolute: 0.1 10*3/uL (ref 0.0–0.5)
Eosinophils Relative: 1 %
HCT: 32.4 % — ABNORMAL LOW (ref 36.0–46.0)
Hemoglobin: 10 g/dL — ABNORMAL LOW (ref 12.0–15.0)
Immature Granulocytes: 0 %
Lymphocytes Relative: 28 %
Lymphs Abs: 1.3 10*3/uL (ref 0.7–4.0)
MCH: 29 pg (ref 26.0–34.0)
MCHC: 30.9 g/dL (ref 30.0–36.0)
MCV: 93.9 fL (ref 80.0–100.0)
Monocytes Absolute: 0.4 10*3/uL (ref 0.1–1.0)
Monocytes Relative: 10 %
Neutro Abs: 2.8 10*3/uL (ref 1.7–7.7)
Neutrophils Relative %: 61 %
Platelets: 367 10*3/uL (ref 150–400)
RBC: 3.45 MIL/uL — ABNORMAL LOW (ref 3.87–5.11)
RDW: 14.9 % (ref 11.5–15.5)
WBC: 4.6 10*3/uL (ref 4.0–10.5)
nRBC: 0 % (ref 0.0–0.2)

## 2020-10-31 LAB — FERRITIN: Ferritin: 26 ng/mL (ref 11–307)

## 2020-10-31 LAB — IRON AND TIBC
Iron: 193 ug/dL — ABNORMAL HIGH (ref 28–170)
Saturation Ratios: 41 % — ABNORMAL HIGH (ref 10.4–31.8)
TIBC: 472 ug/dL — ABNORMAL HIGH (ref 250–450)
UIBC: 279 ug/dL

## 2020-10-31 NOTE — Telephone Encounter (Signed)
-----   Message from Coralee Rud, RN sent at 10/31/2020 10:54 AM EDT ----- Regarding: RE: RESULT Iron levels are being monitored, if there is anything concerning about results we will call her and let her know, if not. She can just keep appts in NOV. Those appts are to continue monitoring iron levels.    ----- Message ----- From: Lane Hacker Sent: 10/31/2020  10:42 AM EDT To: Coralee Rud, RN, # Subject: RESULT                                         Hey pt would like to know her results and if she needs to still come back in Nov.   Thanks

## 2020-10-31 NOTE — Telephone Encounter (Signed)
Reached out to pt to inform iron levels are being monitored, if abnormal results come back we will reach out. However, if normal pt can keep her NOV appts to continue monitoring iron levels. Pt did not answer. Left VM and provided results with a call back if any concerns or questions.

## 2020-10-31 NOTE — Progress Notes (Signed)
I think sheis taking oral iron. Is she tolerating it ok? Numbers are stable but she is still iron deficient.   Durenda Hurt, NP 10/31/2020 3:33 PM

## 2020-11-08 ENCOUNTER — Encounter: Payer: Self-pay | Admitting: Hematology and Oncology

## 2021-01-07 ENCOUNTER — Telehealth: Payer: Self-pay

## 2021-01-07 NOTE — Telephone Encounter (Signed)
Pt calling for results from last visit in June - colposcopy and STI; currently unable to get into her MyChart.  [phone]  Read to pt SDJ's comments on her colpo and STI results; number given for help c MyChart.

## 2021-01-08 ENCOUNTER — Encounter: Payer: Self-pay | Admitting: Physician Assistant

## 2021-01-08 ENCOUNTER — Ambulatory Visit: Payer: Self-pay | Admitting: Physician Assistant

## 2021-01-08 ENCOUNTER — Other Ambulatory Visit: Payer: Self-pay

## 2021-01-08 DIAGNOSIS — Z113 Encounter for screening for infections with a predominantly sexual mode of transmission: Secondary | ICD-10-CM

## 2021-01-08 DIAGNOSIS — B3731 Acute candidiasis of vulva and vagina: Secondary | ICD-10-CM

## 2021-01-08 LAB — WET PREP FOR TRICH, YEAST, CLUE: Trichomonas Exam: NEGATIVE

## 2021-01-08 MED ORDER — CLOTRIMAZOLE 1 % EX CREA: 1.0000 "application " | TOPICAL_CREAM | Freq: Two times a day (BID) | CUTANEOUS | 0 refills | Status: AC

## 2021-01-08 MED ORDER — CLOTRIMAZOLE 1 % VA CREA: 1.0000 | TOPICAL_CREAM | Freq: Every day | VAGINAL | 0 refills | Status: AC

## 2021-01-08 NOTE — Progress Notes (Signed)
[facility] Department STI clinic/screening visit  Subjective:  Joanne Hampton is a 50 y.o. female being seen today for an STI screening visit. The patient reports they do have symptoms.  Patient reports that they do not desire a pregnancy in the next year.   They reported they are not interested in discussing contraception today.  No LMP recorded.   Patient has the following medical conditions:   Patient Active Problem List   Diagnosis Date Noted   Iron deficiency anemia due to chronic blood loss 06/29/2020   Menorrhagia with irregular cycle 06/29/2020   Symptomatic anemia 06/27/2020   Hypertension 03/12/2020   Healthcare maintenance 03/12/2020   At risk for obstructive sleep apnea 07/07/2019   Elevated BP 02/02/2015   Morbid obesity (HCC) 01/24/2007   ANEMIA 01/24/2007   Migraine headache 05/20/2006    Chief Complaint  Patient presents with   SEXUALLY TRANSMITTED DISEASE    screening    HPI  Patient reports that she has had some vaginal itching and gray discharge for 3-4 days.  Denies other symptoms.  Reports that she has had a C-section and BTL as her surgeries.  Reports a history of HTN but is not currently on medicine, migraines and anemia for which she had a blood transfusion in April.  States last HIV and pap were done in April of this year.  LMP 12/27/2020.  See flowsheet for further details and programmatic requirements.    The following portions of the patient's history were reviewed and updated as appropriate: allergies, current medications, past medical history, past social history, past surgical history and problem list.  Objective:  There were no vitals filed for this visit.  Physical Exam Constitutional:      General: She is not in acute distress.    Appearance: Normal appearance.  HENT:     Head: Normocephalic and atraumatic.     Comments: No nits,lice, or hair loss. No cervical, supraclavicular or axillary adenopathy.     Mouth/Throat:      Mouth: Mucous membranes are moist.     Pharynx: Oropharynx is clear. No oropharyngeal exudate or posterior oropharyngeal erythema.  Eyes:     Conjunctiva/sclera: Conjunctivae normal.  Pulmonary:     Effort: Pulmonary effort is normal.  Abdominal:     Palpations: Abdomen is soft. There is no mass.     Tenderness: There is no abdominal tenderness. There is no guarding or rebound.  Genitourinary:    General: Normal vulva.     Rectum: Normal.     Comments: External genitalia/pubic area without nits, lice, edema, erythema, lesions and inguinal adenopathy. Inguinal folds with grayish scaling and scattered areas bilaterally with erythema under scaling. Vagina with normal mucosa and  small amount of clumping white discharge and small amount of light brownish blood present. Cervix without visible lesions. Uterus firm, mobile, nt, no masses, no CMT, no adnexal tenderness or fullness.  Musculoskeletal:     Cervical back: Neck supple. No tenderness.  Skin:    General: Skin is warm and dry.     Findings: No bruising, erythema, lesion or rash.  Neurological:     Mental Status: She is alert and oriented to person, place, and time.  Psychiatric:        Mood and Affect: Mood normal.        Behavior: Behavior normal.        Thought Content: Thought content normal.        Judgment: Judgment normal.     Assessment  and Plan:  Joshalyn Ancheta is a 50 y.o. female presenting to the [facility] Department for STI screening  1. Screening for STD (sexually transmitted disease) Patient into clinic with symptoms. Reviewed with patient wet mount results. Rec condoms with all sex. Await test results.  Counseled that RN will call if needs to RTC for treatment once results are back.  - WET PREP FOR TRICH, YEAST, CLUE - Chlamydia/Gonorrhea [place] [facility] - HIV [place] [facility] - Syphilis Serology, [place] [facility]  2. Candidiasis of vulva and vagina Will treat for yeast with Clotrimazole 1 %  vaginal cream 1 app qhs for 7 days and Clotrimazole 1% BID for 14-21 days externally. Reviewed with patient steps to prevent yeast infection. - clotrimazole (CLOTRIMAZOLE-7) 1 % vaginal cream; Place 1 Applicatorful vaginally at bedtime for 7 days.  Dispense: 45 g; Refill: 0 - clotrimazole (CLOTRIMAZOLE AF) 1 % cream; Apply 1 application topically 2 (two) times daily for 14 days.  Dispense: 30 g; Refill: 0     No follow-ups on file.  Future Appointments  Date Time Provider Department Center  01/30/2021 10:00 AM [facility] None  01/30/2021 10:15 AM Rickard Patience, MD [facility] None  02/03/2021  2:30 PM Butch Penny, NP [facility] None    Matt Holmes, [place]

## 2021-01-23 ENCOUNTER — Encounter: Payer: Self-pay | Admitting: Hematology and Oncology

## 2021-01-27 ENCOUNTER — Encounter: Payer: Self-pay | Admitting: Hematology and Oncology

## 2021-01-30 ENCOUNTER — Other Ambulatory Visit: Payer: Self-pay

## 2021-01-30 ENCOUNTER — Inpatient Hospital Stay (HOSPITAL_BASED_OUTPATIENT_CLINIC_OR_DEPARTMENT_OTHER): Payer: Medicaid Other | Admitting: Nurse Practitioner

## 2021-01-30 ENCOUNTER — Ambulatory Visit: Payer: Self-pay | Admitting: Oncology

## 2021-01-30 ENCOUNTER — Encounter: Payer: Self-pay | Admitting: Nurse Practitioner

## 2021-01-30 ENCOUNTER — Inpatient Hospital Stay: Payer: Medicaid Other | Attending: Oncology

## 2021-01-30 VITALS — BP 147/90 | HR 77 | Temp 98.8°F | Resp 18 | Wt 266.0 lb

## 2021-01-30 DIAGNOSIS — D500 Iron deficiency anemia secondary to blood loss (chronic): Secondary | ICD-10-CM

## 2021-01-30 DIAGNOSIS — N921 Excessive and frequent menstruation with irregular cycle: Secondary | ICD-10-CM | POA: Insufficient documentation

## 2021-01-30 LAB — CBC WITH DIFFERENTIAL/PLATELET
Abs Immature Granulocytes: 0.01 10*3/uL (ref 0.00–0.07)
Basophils Absolute: 0 10*3/uL (ref 0.0–0.1)
Basophils Relative: 0 %
Eosinophils Absolute: 0.1 10*3/uL (ref 0.0–0.5)
Eosinophils Relative: 2 %
HCT: 34.6 % — ABNORMAL LOW (ref 36.0–46.0)
Hemoglobin: 10.5 g/dL — ABNORMAL LOW (ref 12.0–15.0)
Immature Granulocytes: 0 %
Lymphocytes Relative: 33 %
Lymphs Abs: 1.5 10*3/uL (ref 0.7–4.0)
MCH: 28.8 pg (ref 26.0–34.0)
MCHC: 30.3 g/dL (ref 30.0–36.0)
MCV: 95.1 fL (ref 80.0–100.0)
Monocytes Absolute: 0.4 10*3/uL (ref 0.1–1.0)
Monocytes Relative: 9 %
Neutro Abs: 2.6 10*3/uL (ref 1.7–7.7)
Neutrophils Relative %: 56 %
Platelets: 336 10*3/uL (ref 150–400)
RBC: 3.64 MIL/uL — ABNORMAL LOW (ref 3.87–5.11)
RDW: 16.2 % — ABNORMAL HIGH (ref 11.5–15.5)
WBC: 4.5 10*3/uL (ref 4.0–10.5)
nRBC: 0 % (ref 0.0–0.2)

## 2021-01-30 LAB — FERRITIN: Ferritin: 13 ng/mL (ref 11–307)

## 2021-01-30 LAB — IRON AND TIBC
Iron: 28 ug/dL (ref 28–170)
Saturation Ratios: 7 % — ABNORMAL LOW (ref 10.4–31.8)
TIBC: 421 ug/dL (ref 250–450)
UIBC: 393 ug/dL

## 2021-01-30 NOTE — Progress Notes (Signed)
Denies any weakness,dyspnea or dizziness. States she is feeling much better. Energy has improved.

## 2021-01-30 NOTE — Progress Notes (Signed)
Progress Note [facility] day:  01/30/2021  Chief Complaint: Joanne Hampton is a 50 y.o. female with iron deficiency anemia who is here for 20-month follow-up.    HPI:  Joanne Hampton is a 50 y.o. female with iron deficiency anemia.  She has menorrhagia.  Diet appears fairly good.  Labs on 06/27/2020 revealed a hematocrit 17.0, hemoglobin 4.2, MCV 70.0, platelets 587,000, and WBC 7600.  Ferritin was 2 with an iron saturation of 2% and a TIBC of 624.  Retic 2.6%.  PT was 14.0 with an INR of 1.1.  PTT 29.  Folate 27.0.  Creatinine 0.61 on 06/28/2020.  She has never had a colonoscopy.  She was admitted to [facility] from 06/27/2020 - 06/28/2020 with a hemoglobin of 4.2.  She received 3 units of PRBCs.  She received Venofer 200 mg IV on 06/28/2020.  She was discharged on oral iron.  Discharge hemoglobin was 6.4.  She was not having menses.   Interval History: Patient is 50 year old female with above history of iron deficiency anemia secondary to menorrhagia who returns to clinic for labs, follow-up, and consideration of IV iron.  Her energy level has improved and she says overall she feels much better.  She denies any weakness, chest pain, dizziness, shortness of breath.   Past Medical History:  Diagnosis Date   Bronchitis    Menorrhagia     Past Surgical History:  Procedure Laterality Date   CESAREAN SECTION  1990   TUBAL LIGATION      Family History  Problem Relation Age of Onset   Heart failure Mother     Social History:  reports that she has never smoked. She has never used smokeless tobacco. She reports current alcohol use. She reports that she does not use drugs. She denies any exposure to radiation or toxins. She drinks alcohol socially. She works in Therapist, art. The patient is alone today.  Allergies: No Known Allergies  Current Medications: Current Outpatient Medications  Medication Sig Dispense Refill   albuterol (VENTOLIN HFA) 108  (90 Base) MCG/ACT inhaler Inhale 2 puffs into the lungs every 6 (six) hours as needed for wheezing or shortness of breath. 1 each 1   ferrous sulfate 325 (65 FE) MG tablet Take 1 tablet (325 mg total) by mouth 3 (three) times daily with meals. 60 tablet 3   Multiple Vitamin (MULTIVITAMIN) tablet Take 1 tablet by mouth daily.     No current facility-administered medications for this visit.    Review of Systems  Constitutional: Negative.  Negative for chills, fever, malaise/fatigue and weight loss.  HENT:  Negative for congestion, ear pain and tinnitus.   Eyes: Negative.  Negative for blurred vision and double vision.  Respiratory: Negative.  Negative for cough, sputum production and shortness of breath.   Cardiovascular: Negative.  Negative for chest pain, palpitations and leg swelling.  Gastrointestinal: Negative.  Negative for abdominal pain, constipation, diarrhea, nausea and vomiting.  Genitourinary:  Negative for dysuria, frequency and urgency.  Musculoskeletal:  Negative for back pain and falls.  Skin: Negative.  Negative for rash.  Neurological: Negative.  Negative for weakness and headaches.  Endo/Heme/Allergies: Negative.  Does not bruise/bleed easily.  Psychiatric/Behavioral: Negative.  Negative for depression. The patient is not nervous/anxious and does not have insomnia.   Performance Status (ECOG): 0  Vital Signs Blood pressure (!) 147/90, pulse 77, temperature 98.8 F (37.1 C), temperature source Oral, resp. rate 18, weight 266 lb (120.7 kg), SpO2  100 %.  Physical Exam Constitutional:      Appearance: She is not ill-appearing.  Eyes:     General: No scleral icterus.    Conjunctiva/sclera: Conjunctivae normal.  Cardiovascular:     Rate and Rhythm: Normal rate and regular rhythm.  Abdominal:     General: There is no distension.     Palpations: Abdomen is soft.     Tenderness: There is no abdominal tenderness. There is no guarding.  Musculoskeletal:        General: No  deformity.     Right lower leg: No edema.     Left lower leg: No edema.  Lymphadenopathy:     Cervical: No cervical adenopathy.  Skin:    General: Skin is warm and dry.  Neurological:     Mental Status: She is alert and oriented to person, place, and time. Mental status is at baseline.  Psychiatric:        Mood and Affect: Mood normal.        Behavior: Behavior normal.   Orders Only on 01/30/2021  Component Date Value Ref Range Status   WBC 01/30/2021 4.5  4.0 - 10.5 K/uL Final   RBC 01/30/2021 3.64 (A)  3.87 - 5.11 MIL/uL Final   Hemoglobin 01/30/2021 10.5 (A)  12.0 - 15.0 g/dL Final   HCT 01/30/2021 34.6 (A)  36.0 - 46.0 % Final   MCV 01/30/2021 95.1  80.0 - 100.0 fL Final   MCH 01/30/2021 28.8  26.0 - 34.0 pg Final   MCHC 01/30/2021 30.3  30.0 - 36.0 g/dL Final   RDW 01/30/2021 16.2 (A)  11.5 - 15.5 % Final   Platelets 01/30/2021 336  150 - 400 K/uL Final   nRBC 01/30/2021 0.0  0.0 - 0.2 % Final   Neutrophils Relative % 01/30/2021 56  % Final   Neutro Abs 01/30/2021 2.6  1.7 - 7.7 K/uL Final   Lymphocytes Relative 01/30/2021 33  % Final   Lymphs Abs 01/30/2021 1.5  0.7 - 4.0 K/uL Final   Monocytes Relative 01/30/2021 9  % Final   Monocytes Absolute 01/30/2021 0.4  0.1 - 1.0 K/uL Final   Eosinophils Relative 01/30/2021 2  % Final   Eosinophils Absolute 01/30/2021 0.1  0.0 - 0.5 K/uL Final   Basophils Relative 01/30/2021 0  % Final   Basophils Absolute 01/30/2021 0.0  0.0 - 0.1 K/uL Final   Immature Granulocytes 01/30/2021 0  % Final   Abs Immature Granulocytes 01/30/2021 0.01  0.00 - 0.07 K/uL Final   Performed at [facility], [address] [place], [place] [address]   Iron/TIBC/Ferritin/ %Sat    Component Value Date/Time   IRON 28 01/30/2021 1022   TIBC 421 01/30/2021 1022   FERRITIN 13 01/30/2021 1022   IRONPCTSAT 7 (L) 01/30/2021 1022     Assessment & Plan:  1.   Iron deficiency anemia-secondary to heavy menses.  She is tolerated oral iron 3 times a  day well without significant side effects.  She previously received Venofer while hospitalized.  Hemoglobin has improved, now 10.5 (previously 4.2).  No microcytosis.  Clinically she is feeling well.  Iron studies were pending at time of visit.  Ferritin is 13, iron saturation 7%.  I would recommend IV Venofer x 5 given lack of improvement with oral iron.   2. Menorrhagia- managed by [facility].  3. Abnormal findings on pap smear and ultrasound- recommended she see ob-gyn for follow up.  4. Thrombocytosis- suspect reactive etiology  which has now resolved.   Disposition: IV iron/Venofer x 5 RTC in 3 months for labs (cbc, ferritin, iron & tibc) then few days later see MD to establish care; appointment can be virtual if patient prefers.   Consuello Masse, DNP, AGNP-[facility] [phone] (clinic) 01/30/2021

## 2021-02-03 ENCOUNTER — Ambulatory Visit: Payer: BC Managed Care – PPO | Admitting: Adult Health

## 2021-02-03 ENCOUNTER — Encounter: Payer: Self-pay | Admitting: Adult Health

## 2021-02-27 ENCOUNTER — Telehealth: Payer: Self-pay | Admitting: Nurse Practitioner

## 2021-02-27 ENCOUNTER — Encounter: Payer: Self-pay | Admitting: Hematology and Oncology

## 2021-02-27 DIAGNOSIS — D500 Iron deficiency anemia secondary to blood loss (chronic): Secondary | ICD-10-CM

## 2021-02-27 NOTE — Telephone Encounter (Signed)
Called patient to review iron studies and recommendation for IV iron. No answer. Left vm to return call.

## 2021-04-10 ENCOUNTER — Telehealth: Payer: Self-pay

## 2021-04-10 NOTE — Telephone Encounter (Signed)
Patient calls nurse line requesting to schedule an appointment for numbness and tightness in the right side of her face. States that symptoms are primarily located around the eye and cheek. Patient reports that this has been going on since April of 2022. States that this occurs randomly and intermittently.   Patient states she was told that this was probably related to low iron levels. Patient had negative work up for stroke in 06/2020. Patient states that she was told that symptoms should improve over time, however, they are still persistent. Patient is asking to schedule follow up to address why she is still experiencing these symptoms.  Patient denies facial drooping, arm weakness, slurring of speech.   Provided patient with strict ED precautions. Scheduled patient with Dr. Anner Crete tomorrow morning at 8:30. Precepted with Dr. Leveda Anna who agrees with current plan of care.   Will forward to Dr. Anner Crete and PCP as Lorain Childes.   Veronda Prude, RN

## 2021-04-11 ENCOUNTER — Encounter: Payer: Self-pay | Admitting: Family Medicine

## 2021-04-11 ENCOUNTER — Telehealth: Payer: Self-pay | Admitting: Family Medicine

## 2021-04-11 ENCOUNTER — Ambulatory Visit (INDEPENDENT_AMBULATORY_CARE_PROVIDER_SITE_OTHER): Payer: Self-pay | Admitting: Family Medicine

## 2021-04-11 ENCOUNTER — Other Ambulatory Visit: Payer: Self-pay

## 2021-04-11 VITALS — BP 138/85 | HR 70 | Ht 62.0 in | Wt 264.0 lb

## 2021-04-11 DIAGNOSIS — R202 Paresthesia of skin: Secondary | ICD-10-CM

## 2021-04-11 NOTE — Telephone Encounter (Signed)
Patient dropped off disability placard form to be completed. Last DOS was 04/11/21. Placed in [employer].

## 2021-04-11 NOTE — Telephone Encounter (Signed)
Reviewed form and placed in PCP's box for completion.  .Beckham Capistran R Carnel Stegman, CMA  

## 2021-04-11 NOTE — Patient Instructions (Addendum)
It was great to meet you!  It is not 100% clear what is causing the symptoms in your face. Fortunately it does not seem like anything dangerous or concerning.  If it becomes more bothersome, let us know and we can consider trying a medication called Gabapentin which can help with nerve pain.  It may also be worthwhile to try Flonase and oral allergy medicaitons (Zyrtec, Allegra, etc) daily to see if this helps at all.  If you notice vision changes, slurred speech, one sided weakness or other concerns please let us know.  Please schedule an annual physical with your PCP to discuss cholesterol screening, colonoscopy, etc.  Take care and seek immediate care sooner if you develop any concerns.  Dr. [facility]

## 2021-04-11 NOTE — Progress Notes (Signed)
° ° °  SUBJECTIVE:   CHIEF COMPLAINT / HPI:   Facial Tingling Patient reports an unusual sensation in the right side of her face. Symptoms started around April 2022 and have been constant since that time. States she has a "pulling sensation" and the skin feels "tense" on the right side of her face, mostly near her upper cheek/temple region. Symptoms present 24/7 but occasionally will be worse than other times. Unable to identify any obvious aggravating factor. Also has the sensation that her face is puffy on that side sometimes-- "like she went to the dentist but she hasn't". Does not notice any swelling or changes when she looks in the mirror, though. No vision changes, no speech changes, no numbness, no extremity weakness. Patient is not necessarily worried about anything in particular, but is frustrated that she can't tell why she's having these symptoms. Previously told it may be related to her iron deficiency anemia (Hgb 4.2 in April 2022 with ferritin 2, most recent Hgb 10.5 in Nov 2022 with ferritin 13, sat ratio 7). Notes she does have a history of migraines but those present very differently.  PERTINENT  PMH / PSH: iron deficiency anemia, migraines, obesity, HTN  OBJECTIVE:   BP 138/85    Pulse 70    Ht 5\' 2"  (1.575 m)    Wt 264 lb (119.7 kg)    LMP 03/07/2021 (Approximate)    SpO2 100%    BMI 48.29 kg/m   General: NAD, pleasant, able to participate in exam HEENT: PERRL, EOMI, boggy and mildly edematous nasal turbinates noted bilaterally Respiratory: No respiratory distress Skin: warm and dry, no rashes noted Psych: Normal affect and mood Neuro: CN II-XII intact, 5/5 strength throughout bilateral upper and lower extremities, sensation intact to light touch throughout extremities, normal gait  ASSESSMENT/PLAN:   Facial tingling Patient with constant tingling, "pulling sensation" on the right side of her face for the past 9 months. No red flags on history (no vision changes, skin  lesions, facial droop, speech difficulty, numbness or extremity weakness). Neuro exam is unremarkable. Etiology of symptoms is unclear-- considered trigeminal neuralgia vs atypical migraine but would expect these to be intermittent rather than constant for months. She had unremarkable head imaging (CT and MRI) in April 2022 which is reassuring. Offered trial of Gabapentin for unspecified neuropathic-like pain, but patient prefers to hold off. She does have hx of allergic rhinitis which is currently untreated, so she will try Zyrtec/Flonase to see if this causes any change in her symptoms. Otherwise, reassurance provided and return precautions reviewed.   May 2022, MD [facility]

## 2021-04-11 NOTE — Assessment & Plan Note (Addendum)
Patient with constant tingling, "pulling sensation" on the right side of her face for the past 9 months. No red flags on history (no vision changes, skin lesions, facial droop, speech difficulty, numbness or extremity weakness). Neuro exam is unremarkable. Etiology of symptoms is unclear-- considered trigeminal neuralgia vs atypical migraine but would expect these to be intermittent rather than constant for months. She had unremarkable head imaging (CT and MRI) in April 2022 which is reassuring. Offered trial of Gabapentin for unspecified neuropathic-like pain, but patient prefers to hold off. She does have hx of allergic rhinitis which is currently untreated, so she will try Zyrtec/Flonase to see if this causes any change in her symptoms. Otherwise, reassurance provided and return precautions reviewed.

## 2021-04-18 NOTE — Telephone Encounter (Signed)
Form filled out and placed in triage box.   Joanne Simpson Autry-Lott, DO 04/18/2021, 1:40 PM PGY-3, [facility]

## 2021-04-21 NOTE — Telephone Encounter (Signed)
Form placed up front for pick up and a copy was made for batch scanning.   Attempted to call patient, however no answer or option for VM.

## 2021-04-22 ENCOUNTER — Other Ambulatory Visit: Payer: Self-pay | Admitting: *Deleted

## 2021-04-22 DIAGNOSIS — D500 Iron deficiency anemia secondary to blood loss (chronic): Secondary | ICD-10-CM

## 2021-04-28 ENCOUNTER — Other Ambulatory Visit: Payer: Self-pay | Admitting: Student in an Organized Health Care Education/Training Program

## 2021-04-28 DIAGNOSIS — J069 Acute upper respiratory infection, unspecified: Secondary | ICD-10-CM

## 2021-04-30 ENCOUNTER — Other Ambulatory Visit: Payer: Self-pay

## 2021-04-30 ENCOUNTER — Inpatient Hospital Stay: Payer: Self-pay | Attending: Oncology

## 2021-04-30 DIAGNOSIS — D500 Iron deficiency anemia secondary to blood loss (chronic): Secondary | ICD-10-CM | POA: Insufficient documentation

## 2021-04-30 DIAGNOSIS — Z8249 Family history of ischemic heart disease and other diseases of the circulatory system: Secondary | ICD-10-CM | POA: Insufficient documentation

## 2021-04-30 DIAGNOSIS — N920 Excessive and frequent menstruation with regular cycle: Secondary | ICD-10-CM | POA: Insufficient documentation

## 2021-04-30 DIAGNOSIS — Z79899 Other long term (current) drug therapy: Secondary | ICD-10-CM | POA: Insufficient documentation

## 2021-04-30 DIAGNOSIS — R5383 Other fatigue: Secondary | ICD-10-CM | POA: Insufficient documentation

## 2021-04-30 LAB — FERRITIN: Ferritin: 11 ng/mL (ref 11–307)

## 2021-04-30 LAB — CBC WITH DIFFERENTIAL/PLATELET
Abs Immature Granulocytes: 0.02 10*3/uL (ref 0.00–0.07)
Basophils Absolute: 0 10*3/uL (ref 0.0–0.1)
Basophils Relative: 0 %
Eosinophils Absolute: 0.1 10*3/uL (ref 0.0–0.5)
Eosinophils Relative: 2 %
HCT: 32.3 % — ABNORMAL LOW (ref 36.0–46.0)
Hemoglobin: 9.8 g/dL — ABNORMAL LOW (ref 12.0–15.0)
Immature Granulocytes: 0 %
Lymphocytes Relative: 39 %
Lymphs Abs: 1.8 10*3/uL (ref 0.7–4.0)
MCH: 28.4 pg (ref 26.0–34.0)
MCHC: 30.3 g/dL (ref 30.0–36.0)
MCV: 93.6 fL (ref 80.0–100.0)
Monocytes Absolute: 0.5 10*3/uL (ref 0.1–1.0)
Monocytes Relative: 11 %
Neutro Abs: 2.2 10*3/uL (ref 1.7–7.7)
Neutrophils Relative %: 48 %
Platelets: 414 10*3/uL — ABNORMAL HIGH (ref 150–400)
RBC: 3.45 MIL/uL — ABNORMAL LOW (ref 3.87–5.11)
RDW: 15.7 % — ABNORMAL HIGH (ref 11.5–15.5)
WBC: 4.6 10*3/uL (ref 4.0–10.5)
nRBC: 0 % (ref 0.0–0.2)

## 2021-04-30 LAB — IRON AND TIBC
Iron: 52 ug/dL (ref 28–170)
Saturation Ratios: 11 % (ref 10.4–31.8)
TIBC: 468 ug/dL — ABNORMAL HIGH (ref 250–450)
UIBC: 416 ug/dL

## 2021-05-02 ENCOUNTER — Inpatient Hospital Stay: Payer: Self-pay | Admitting: Oncology

## 2021-05-02 ENCOUNTER — Ambulatory Visit: Payer: Medicaid Other | Admitting: Nurse Practitioner

## 2021-05-02 ENCOUNTER — Other Ambulatory Visit: Payer: Medicaid Other

## 2021-05-08 ENCOUNTER — Inpatient Hospital Stay: Payer: Self-pay | Admitting: Oncology

## 2021-05-08 ENCOUNTER — Other Ambulatory Visit: Payer: Self-pay | Admitting: Oncology

## 2021-05-08 ENCOUNTER — Inpatient Hospital Stay (HOSPITAL_BASED_OUTPATIENT_CLINIC_OR_DEPARTMENT_OTHER): Payer: Self-pay | Admitting: Oncology

## 2021-05-08 ENCOUNTER — Encounter: Payer: Self-pay | Admitting: Oncology

## 2021-05-08 ENCOUNTER — Other Ambulatory Visit: Payer: Self-pay

## 2021-05-08 DIAGNOSIS — D500 Iron deficiency anemia secondary to blood loss (chronic): Secondary | ICD-10-CM

## 2021-05-08 DIAGNOSIS — N921 Excessive and frequent menstruation with irregular cycle: Secondary | ICD-10-CM

## 2021-05-08 MED ORDER — FERROUS SULFATE 325 (65 FE) MG PO TABS: 325.0000 mg | ORAL_TABLET | Freq: Three times a day (TID) | ORAL | 2 refills | Status: DC

## 2021-05-08 MED ORDER — ASCORBIC ACID 500 MG PO TABS: 500.0000 mg | ORAL_TABLET | Freq: Every day | ORAL | 0 refills | Status: DC

## 2021-05-08 NOTE — Progress Notes (Signed)
Patient contacted for Mychart visit. No new concerns voiced.  

## 2021-05-08 NOTE — Progress Notes (Signed)
HEMATOLOGY-ONCOLOGY TeleHEALTH VISIT PROGRESS NOTE  I connected with Joanne Hampton on 05/08/21  at  3:30 PM EST by video enabled telemedicine visit and verified that I am speaking with the correct person using two identifiers. I discussed the limitations, risks, security and privacy concerns of performing an evaluation and management service by telemedicine and the availability of in-person appointments. The patient expressed understanding and agreed to proceed.   Other persons participating in the visit and their role in the encounter:  None  Patient's location: in her vehicle, not driving Provider's location: office Chief Complaint: iron deficiency anemia   INTERVAL HISTORY Patient previously followed up by Dr.Corcoran, patient switched care to me on 05/08/21 Extensive medical record review was performed by me 06/27/2020 - 06/28/2020 hospitalization with a hemoglobin of 4.2.  She received 3 units of PRBCs.  She received Venofer 200 mg IV on 06/28/2020.  She was discharged on oral iron Never had colonoscopy.  + fatigue Menstrual period flow has improved. Not as heavy as previously.   Review of Systems  Constitutional:  Positive for fatigue. Negative for appetite change, chills and fever.  HENT:   Negative for hearing loss and voice change.   Eyes:  Negative for eye problems.  Respiratory:  Negative for chest tightness and cough.   Cardiovascular:  Negative for chest pain.  Gastrointestinal:  Negative for abdominal distention, abdominal pain and blood in stool.  Endocrine: Negative for hot flashes.  Genitourinary:  Negative for difficulty urinating and frequency.   Musculoskeletal:  Negative for arthralgias.  Skin:  Negative for itching and rash.  Neurological:  Negative for extremity weakness.  Hematological:  Negative for adenopathy.  Psychiatric/Behavioral:  Negative for confusion.    Past Medical History:  Diagnosis Date   Bronchitis    Menorrhagia    Past Surgical  History:  Procedure Laterality Date   CESAREAN SECTION  1990   TUBAL LIGATION      Family History  Problem Relation Age of Onset   Heart failure Mother     Social History   Socioeconomic History   Marital status: Divorced    Spouse name: Not on file   Number of children: Not on file   Years of education: Not on file   Highest education level: Not on file  Occupational History   Not on file  Tobacco Use   Smoking status: Never   Smokeless tobacco: Never  Vaping Use   Vaping Use: Never used  Substance and Sexual Activity   Alcohol use: Yes    Comment: occasional   Drug use: No   Sexual activity: Not Currently    Partners: Male    Birth control/protection: Surgical  Other Topics Concern   Not on file  Social History Narrative   Not on file   Social Determinants of Health   Financial Resource Strain: Not on file  Food Insecurity: Not on file  Transportation Needs: Not on file  Physical Activity: Not on file  Stress: Not on file  Social Connections: Not on file  Intimate Partner Violence: Not on file    Current Outpatient Medications on File Prior to Visit  Medication Sig Dispense Refill   albuterol (VENTOLIN HFA) 108 (90 Base) MCG/ACT inhaler Inhale 2 puffs into the lungs every 6 (six) hours as needed for wheezing or shortness of breath. 1 each 1   Multiple Vitamin (MULTIVITAMIN) tablet Take 1 tablet by mouth daily.     No current facility-administered medications on file prior to visit.  No Known Allergies     Observations/Objective: There were no vitals filed for this visit. There is no height or weight on file to calculate BMI.  Physical Exam Neurological:     Mental Status: She is alert.    CBC    Component Value Date/Time   WBC 4.6 04/30/2021 0933   RBC 3.45 (L) 04/30/2021 0933   HGB 9.8 (L) 04/30/2021 0933   HCT 32.3 (L) 04/30/2021 0933   PLT 414 (H) 04/30/2021 0933   MCV 93.6 04/30/2021 0933   MCH 28.4 04/30/2021 0933   MCHC 30.3  04/30/2021 0933   RDW 15.7 (H) 04/30/2021 0933   LYMPHSABS 1.8 04/30/2021 0933   MONOABS 0.5 04/30/2021 0933   EOSABS 0.1 04/30/2021 0933   BASOSABS 0.0 04/30/2021 0933    CMP     Component Value Date/Time   NA 138 06/28/2020 0428   K 4.3 06/28/2020 0428   CL 106 06/28/2020 0428   CO2 25 06/28/2020 0428   GLUCOSE 101 (H) 06/28/2020 0428   BUN 11 06/28/2020 0428   CREATININE 0.61 06/28/2020 0428   CREATININE 0.79 02/01/2015 1524   CALCIUM 8.5 (L) 06/28/2020 0428   PROT 7.0 06/28/2020 0428   ALBUMIN 3.2 (L) 06/28/2020 0428   AST 13 (L) 06/28/2020 0428   ALT 10 06/28/2020 0428   ALKPHOS 71 06/28/2020 0428   BILITOT 0.4 06/28/2020 0428   GFRNONAA >60 06/28/2020 0428   GFRNONAA >89 02/01/2015 1524   GFRAA >89 02/01/2015 1524     Assessment and Plan: 1. Iron deficiency anemia due to chronic blood loss   2. Menorrhagia with irregular cycle     # Iron deficiency anemia Labs are reviewed and discussed with patient. Hemoglobin has decreased. 9.8 Ferritin 11, TIBC 468, iron saturation 11.  Consistent with iron deficiency anemia, worsen despite taking oral iron supplementation.  Recommend IV venofer treatments.  Patient prefers to continue oral iron supplementation.  Ferrous sulfate 325mg  BID, plus vitamin c 500mg . Rx sent to pharmacy.   Recommend patient to get colonoscopy. Recommend her to see gyn for evaluation of menorrhagia.   Follow Up Instructions: Repeat blood work in 3 months, MD evaluation.    I discussed the assessment and treatment plan with the patient. The patient was provided an opportunity to ask questions and all were answered. The patient agreed with the plan and demonstrated an understanding of the instructions.  The patient was advised to call back or seek an in-person evaluation if the symptoms worsen or if the condition fails to improve as anticipated.  , MD 05/08/2021 9:49 PM

## 2021-05-26 ENCOUNTER — Encounter: Payer: Self-pay | Admitting: Hematology and Oncology

## 2021-06-26 ENCOUNTER — Other Ambulatory Visit: Payer: Self-pay | Admitting: Student in an Organized Health Care Education/Training Program

## 2021-08-04 ENCOUNTER — Telehealth: Payer: Self-pay | Admitting: Oncology

## 2021-08-04 NOTE — Telephone Encounter (Signed)
pt called in to confirm appt, not sure if she can make 05/18 appt, but will notify us during appointment tomorrow... KJ  ?

## 2021-08-05 ENCOUNTER — Inpatient Hospital Stay: Payer: Medicaid Other | Attending: Oncology

## 2021-08-05 DIAGNOSIS — N920 Excessive and frequent menstruation with regular cycle: Secondary | ICD-10-CM | POA: Insufficient documentation

## 2021-08-05 DIAGNOSIS — R5383 Other fatigue: Secondary | ICD-10-CM | POA: Insufficient documentation

## 2021-08-05 DIAGNOSIS — Z79899 Other long term (current) drug therapy: Secondary | ICD-10-CM | POA: Insufficient documentation

## 2021-08-05 DIAGNOSIS — Z8249 Family history of ischemic heart disease and other diseases of the circulatory system: Secondary | ICD-10-CM | POA: Insufficient documentation

## 2021-08-05 DIAGNOSIS — D500 Iron deficiency anemia secondary to blood loss (chronic): Secondary | ICD-10-CM | POA: Insufficient documentation

## 2021-08-05 LAB — IRON AND TIBC
Iron: 30 ug/dL (ref 28–170)
Saturation Ratios: 7 % — ABNORMAL LOW (ref 10.4–31.8)
TIBC: 410 ug/dL (ref 250–450)
UIBC: 380 ug/dL

## 2021-08-05 LAB — CBC WITH DIFFERENTIAL/PLATELET
Abs Immature Granulocytes: 0.01 10*3/uL (ref 0.00–0.07)
Basophils Absolute: 0 10*3/uL (ref 0.0–0.1)
Basophils Relative: 0 %
Eosinophils Absolute: 0.2 10*3/uL (ref 0.0–0.5)
Eosinophils Relative: 3 %
HCT: 37 % (ref 36.0–46.0)
Hemoglobin: 11.1 g/dL — ABNORMAL LOW (ref 12.0–15.0)
Immature Granulocytes: 0 %
Lymphocytes Relative: 32 %
Lymphs Abs: 1.7 10*3/uL (ref 0.7–4.0)
MCH: 28.9 pg (ref 26.0–34.0)
MCHC: 30 g/dL (ref 30.0–36.0)
MCV: 96.4 fL (ref 80.0–100.0)
Monocytes Absolute: 0.5 10*3/uL (ref 0.1–1.0)
Monocytes Relative: 9 %
Neutro Abs: 2.9 10*3/uL (ref 1.7–7.7)
Neutrophils Relative %: 56 %
Platelets: 398 10*3/uL (ref 150–400)
RBC: 3.84 MIL/uL — ABNORMAL LOW (ref 3.87–5.11)
RDW: 15.4 % (ref 11.5–15.5)
WBC: 5.2 10*3/uL (ref 4.0–10.5)
nRBC: 0 % (ref 0.0–0.2)

## 2021-08-05 LAB — FERRITIN: Ferritin: 19 ng/mL (ref 11–307)

## 2021-08-07 ENCOUNTER — Inpatient Hospital Stay (HOSPITAL_BASED_OUTPATIENT_CLINIC_OR_DEPARTMENT_OTHER): Payer: Self-pay | Admitting: Oncology

## 2021-08-07 ENCOUNTER — Encounter: Payer: Self-pay | Admitting: Oncology

## 2021-08-07 VITALS — BP 153/77 | HR 71 | Temp 97.7°F | Resp 18 | Wt 256.1 lb

## 2021-08-07 DIAGNOSIS — D500 Iron deficiency anemia secondary to blood loss (chronic): Secondary | ICD-10-CM

## 2021-08-07 DIAGNOSIS — N921 Excessive and frequent menstruation with irregular cycle: Secondary | ICD-10-CM

## 2021-08-07 MED ORDER — FERROUS SULFATE 325 (65 FE) MG PO TABS: 325.0000 mg | ORAL_TABLET | Freq: Two times a day (BID) | ORAL | 1 refills | Status: DC

## 2021-08-07 NOTE — Progress Notes (Signed)
Hematology/Oncology Progress note [phone] [phone]     Patient Care Team: Lavonda Jumbo, DO as PCP - General (Family Medicine) Jim Like, RN as Registered Nurse Scarlett Presto, RN as Registered Nurse Rickard Patience, MD as Consulting Physician (Hematology and Oncology)   Name of the patient: Joanne Hampton  [id number]  January 03, 1971   REASON FOR VISIT Iron deficiency anemia  PERTINENT HEMATOLOGY HISTORY Patient previously followed up by Dr.Corcoran, patient switched care to me on 05/08/21 Extensive medical record review was performed by me 06/27/2020 - 06/28/2020 hospitalization with a hemoglobin of 4.2.  She received 3 units of PRBCs.  She received Venofer 200 mg IV on 06/28/2020.  She was discharged on oral iron Never had colonoscopy.    INTERVAL HISTORY Charna Neeb is a 51 y.o. female who has above history reviewed by me today presents for follow up visit for management of iron deficiency anemia secondary to chronic blood loss. Patient takes oral iron supplementation 3 times daily with meals.  Overall she tolerates well Fatigue has improved. Menstrual flow is not as heavy as previous.  No new complaints.   Review of Systems  Constitutional:  Negative for appetite change, chills, fatigue and fever.  HENT:   Negative for hearing loss and voice change.   Eyes:  Negative for eye problems.  Respiratory:  Negative for chest tightness and cough.   Cardiovascular:  Negative for chest pain.  Gastrointestinal:  Negative for abdominal distention, abdominal pain and blood in stool.  Endocrine: Negative for hot flashes.  Genitourinary:  Positive for menstrual problem. Negative for difficulty urinating and frequency.   Musculoskeletal:  Negative for arthralgias.  Skin:  Negative for itching and rash.  Neurological:  Negative for extremity weakness.  Hematological:  Negative for adenopathy.  Psychiatric/Behavioral:  Negative for  confusion.      No Known Allergies   Past Medical History:  Diagnosis Date   Bronchitis    Menorrhagia      Past Surgical History:  Procedure Laterality Date   CESAREAN SECTION  1990   TUBAL LIGATION      Social History   Socioeconomic History   Marital status: Divorced    Spouse name: Not on file   Number of children: Not on file   Years of education: Not on file   Highest education level: Not on file  Occupational History   Not on file  Tobacco Use   Smoking status: Never   Smokeless tobacco: Never  Vaping Use   Vaping Use: Never used  Substance and Sexual Activity   Alcohol use: Yes    Comment: occasional   Drug use: No   Sexual activity: Not Currently    Partners: Male    Birth control/protection: Surgical  Other Topics Concern   Not on file  Social History Narrative   Not on file   Social Determinants of Health   Financial Resource Strain: Not on file  Food Insecurity: Not on file  Transportation Needs: Not on file  Physical Activity: Not on file  Stress: Not on file  Social Connections: Not on file  Intimate Partner Violence: Not on file    Family History  Problem Relation Age of Onset   Heart failure Mother      Current Outpatient Medications:    albuterol (VENTOLIN HFA) 108 (90 Base) MCG/ACT inhaler, INHALE 2 PUFFS INTO THE LUNGS EVERY 6 HOURS AS NEEDED FOR WHEEZING OR SHORTNESS OF BREATH, Disp: 6.7 g, Rfl: 0  ascorbic acid (VITAMIN C) 500 MG tablet, Take 1 tablet (500 mg total) by mouth daily., Disp: 90 tablet, Rfl: 0   Multiple Vitamin (MULTIVITAMIN) tablet, Take 1 tablet by mouth daily., Disp: , Rfl:    ferrous sulfate 325 (65 FE) MG tablet, Take 1 tablet (325 mg total) by mouth 2 (two) times daily with a meal., Disp: 180 tablet, Rfl: 1  Physical exam:  Vitals:   08/07/21 1054  BP: (!) 153/77  Pulse: 71  Resp: 18  Temp: 97.7 F (36.5 C)  Weight: 256 lb 1.6 oz (116.2 kg)   Physical Exam Constitutional:      General: She is  not in acute distress.    Appearance: She is obese.  HENT:     Head: Normocephalic and atraumatic.  Eyes:     General: No scleral icterus. Cardiovascular:     Rate and Rhythm: Normal rate and regular rhythm.     Heart sounds: Normal heart sounds.  Pulmonary:     Effort: Pulmonary effort is normal. No respiratory distress.     Breath sounds: No wheezing.  Abdominal:     General: Bowel sounds are normal. There is no distension.     Palpations: Abdomen is soft.  Musculoskeletal:        General: No deformity. Normal range of motion.     Cervical back: Normal range of motion and neck supple.  Skin:    General: Skin is warm and dry.     Findings: No erythema or rash.  Neurological:     Mental Status: She is alert and oriented to person, place, and time. Mental status is at baseline.     Cranial Nerves: No cranial nerve deficit.     Coordination: Coordination normal.  Psychiatric:        Mood and Affect: Mood normal.       Latest Ref Rng & Units 06/28/2020    4:28 AM  CMP  Glucose 70 - 99 mg/dL 782    BUN 6 - 20 mg/dL 11    Creatinine 9.56 - 1.00 mg/dL 2.13    Sodium 086 - 578 mmol/L 138    Potassium 3.5 - 5.1 mmol/L 4.3    Chloride 98 - 111 mmol/L 106    CO2 22 - 32 mmol/L 25    Calcium 8.9 - 10.3 mg/dL 8.5    Total Protein 6.5 - 8.1 g/dL 7.0    Total Bilirubin 0.3 - 1.2 mg/dL 0.4    Alkaline Phos 38 - 126 U/L 71    AST 15 - 41 U/L 13    ALT 0 - 44 U/L 10        Latest Ref Rng & Units 08/05/2021    9:46 AM  CBC  WBC 4.0 - 10.5 K/uL 5.2    Hemoglobin 12.0 - 15.0 g/dL 46.9    Hematocrit 62.9 - 46.0 % 37.0    Platelets 150 - 400 K/uL 398      RADIOGRAPHIC STUDIES: I have personally reviewed the radiological images as listed and agreed with the findings in the report. No results found.   Assessment and plan  1. Iron deficiency anemia due to chronic blood loss   2. Menorrhagia with irregular cycle    #Iron deficiency anemia due to chronic blood  loss/menorrhagia Labs reviewed and discussed with patient Hemoglobin has improved from 9.8  to 11.1. Iron saturation 7, ferritin 19. We have previously discussed about option of IV Venofer treatments.  Patient prefers to take  oral iron supplementation for Her blood count has improved and responded well to the oral iron supplementation I recommend patient to continue ferrous sulfate 325 mg 2-3 times daily with meals.  Continue vitamin C supplementation for   Orders Placed This Encounter  Procedures   CBC with Differential/Platelet    Standing Status:   Future    Standing Expiration Date:   08/08/2022   Ferritin    Standing Status:   Future    Standing Expiration Date:   08/08/2022    Follow-up in 4 months   Rickard PatienceZhou Reba Hulett, MD, PhD [facility] Hemat[facility] Oncology 08/07/2021

## 2021-08-07 NOTE — Progress Notes (Signed)
Pt here for follow up. No new concerns voiced.   

## 2021-08-19 ENCOUNTER — Encounter (HOSPITAL_COMMUNITY): Payer: Self-pay | Admitting: Emergency Medicine

## 2021-08-19 ENCOUNTER — Other Ambulatory Visit: Payer: Self-pay

## 2021-08-19 ENCOUNTER — Emergency Department (HOSPITAL_COMMUNITY)
Admission: EM | Admit: 2021-08-19 | Discharge: 2021-08-19 | Disposition: A | Discharge status: Home / Self Care | Payer: Self-pay | Attending: Emergency Medicine | Admitting: Emergency Medicine

## 2021-08-19 ENCOUNTER — Emergency Department (HOSPITAL_COMMUNITY): Payer: Self-pay

## 2021-08-19 DIAGNOSIS — J4521 Mild intermittent asthma with (acute) exacerbation: Secondary | ICD-10-CM | POA: Insufficient documentation

## 2021-08-19 LAB — BASIC METABOLIC PANEL
Anion gap: 7 (ref 5–15)
BUN: 8 mg/dL (ref 6–20)
CO2: 28 mmol/L (ref 22–32)
Calcium: 9 mg/dL (ref 8.9–10.3)
Chloride: 102 mmol/L (ref 98–111)
Creatinine, Ser: 0.74 mg/dL (ref 0.44–1.00)
GFR, Estimated: >60 mL/min (ref >60)
Glucose, Bld: 112 mg/dL — ABNORMAL HIGH (ref 70–99)
Potassium: 4.1 mmol/L (ref 3.5–5.1)
Sodium: 137 mmol/L (ref 135–145)

## 2021-08-19 LAB — CBC WITH DIFFERENTIAL/PLATELET
Abs Immature Granulocytes: 0.01 10*3/uL (ref 0.00–0.07)
Basophils Absolute: 0 10*3/uL (ref 0.0–0.1)
Basophils Relative: 0 %
Eosinophils Absolute: 0.2 10*3/uL (ref 0.0–0.5)
Eosinophils Relative: 5 %
HCT: 37.3 % (ref 36.0–46.0)
Hemoglobin: 11.2 g/dL — ABNORMAL LOW (ref 12.0–15.0)
Immature Granulocytes: 0 %
Lymphocytes Relative: 26 %
Lymphs Abs: 1.2 10*3/uL (ref 0.7–4.0)
MCH: 29.2 pg (ref 26.0–34.0)
MCHC: 30 g/dL (ref 30.0–36.0)
MCV: 97.1 fL (ref 80.0–100.0)
Monocytes Absolute: 0.5 10*3/uL (ref 0.1–1.0)
Monocytes Relative: 12 %
Neutro Abs: 2.5 10*3/uL (ref 1.7–7.7)
Neutrophils Relative %: 57 %
Platelets: 320 10*3/uL (ref 150–400)
RBC: 3.84 MIL/uL — ABNORMAL LOW (ref 3.87–5.11)
RDW: 14.7 % (ref 11.5–15.5)
WBC: 4.5 10*3/uL (ref 4.0–10.5)
nRBC: 0 % (ref 0.0–0.2)

## 2021-08-19 LAB — TROPONIN I (HIGH SENSITIVITY)
Troponin I (High Sensitivity): 4 ng/L (ref <18)
Troponin I (High Sensitivity): 4 ng/L (ref <18)

## 2021-08-19 MED ORDER — PREDNISONE 10 MG PO TABS: 40.0000 mg | ORAL_TABLET | Freq: Every day | ORAL | 0 refills | Status: AC

## 2021-08-19 MED ORDER — PREDNISONE 20 MG PO TABS
60.0000 mg | ORAL_TABLET | Freq: Once | ORAL | Status: AC
  Administered 2021-08-19: 60 mg via ORAL
  Filled 2021-08-19: qty 3

## 2021-08-19 MED ORDER — IPRATROPIUM-ALBUTEROL 0.5-2.5 (3) MG/3ML IN SOLN
3.0000 mL | Freq: Once | RESPIRATORY_TRACT | Status: AC
  Administered 2021-08-19: 3 mL via RESPIRATORY_TRACT
  Filled 2021-08-19: qty 3

## 2021-08-19 MED ORDER — ALBUTEROL SULFATE HFA 108 (90 BASE) MCG/ACT IN AERS: 2.0000 | INHALATION_SPRAY | RESPIRATORY_TRACT | Status: DC | PRN

## 2021-08-19 NOTE — ED Provider Notes (Signed)
[facility] EMERGENCY DEPARTMENT Provider Note   CSN: [id number] Arrival date & time: 08/19/21  7341     History  Chief Complaint  Patient presents with   Wheezing    Joanne Hampton is a 51 y.o. female with a past medical history of asthma presenting to the ED with a chief complaint of shortness of breath and wheezing.  Has had symptoms over the past few days but manageable with her home albuterol inhaler.  However last night and this morning her symptoms got worse.  Reports cough productive with clear mucus as well as chest tightness in the center of her chest.  She tried using her inhaler today with only minimal improvement.  Denies any leg swelling, fever or sick contacts with similar symptoms.  She states that the symptoms feel typical of her asthma exacerbations.  Denies any abdominal pain, vomiting or hemoptysis.   Wheezing Associated symptoms: chest tightness, cough and shortness of breath   Associated symptoms: no chest pain, no ear pain, no fever, no rash, no rhinorrhea and no sore throat       Home Medications Prior to Admission medications   Medication Sig Start Date End Date Taking? Authorizing Provider  predniSONE (DELTASONE) 10 MG tablet Take 4 tablets (40 mg total) by mouth daily for 4 days. 08/19/21 08/23/21 Yes Shemeca Lukasik, PA-C  albuterol (VENTOLIN HFA) 108 (90 Base) MCG/ACT inhaler INHALE 2 PUFFS INTO THE LUNGS EVERY 6 HOURS AS NEEDED FOR WHEEZING OR SHORTNESS OF BREATH 06/29/21   Autry-Lott, Randa Evens, DO  ascorbic acid (VITAMIN C) 500 MG tablet Take 1 tablet (500 mg total) by mouth daily. 05/08/21   Rickard Patience, MD  ferrous sulfate 325 (65 FE) MG tablet Take 1 tablet (325 mg total) by mouth 2 (two) times daily with a meal. 08/07/21   Rickard Patience, MD  Multiple Vitamin (MULTIVITAMIN) tablet Take 1 tablet by mouth daily.    [provider]      Allergies    Patient has no known allergies.    Review of Systems   Review of Systems   Constitutional:  Negative for appetite change, chills and fever.  HENT:  Negative for ear pain, rhinorrhea, sneezing and sore throat.   Eyes:  Negative for photophobia and visual disturbance.  Respiratory:  Positive for cough, chest tightness, shortness of breath and wheezing.   Cardiovascular:  Negative for chest pain, palpitations and leg swelling.  Gastrointestinal:  Negative for abdominal pain, blood in stool, constipation, diarrhea, nausea and vomiting.  Genitourinary:  Negative for dysuria, hematuria and urgency.  Musculoskeletal:  Negative for myalgias.  Skin:  Negative for rash.  Neurological:  Negative for dizziness, weakness and light-headedness.   Physical Exam Updated Vital Signs BP (!) 153/89   Pulse 79   Temp 98.4 F (36.9 C) (Oral)   Resp 16   LMP 08/16/2021 Comment: tubal ligation  SpO2 99%  Physical Exam Vitals and nursing note reviewed.  Constitutional:      General: She is not in acute distress.    Appearance: She is well-developed.  HENT:     Head: Normocephalic and atraumatic.     Nose: Nose normal.  Eyes:     General: No scleral icterus.       Right eye: No discharge.        Left eye: No discharge.     Conjunctiva/sclera: Conjunctivae normal.  Cardiovascular:     Rate and Rhythm: Normal rate and regular rhythm.     Heart  sounds: Normal heart sounds. No murmur heard.   No friction rub. No gallop.  Pulmonary:     Effort: Pulmonary effort is normal. No respiratory distress.     Breath sounds: Wheezing present.  Abdominal:     General: Bowel sounds are normal. There is no distension.     Palpations: Abdomen is soft.     Tenderness: There is no abdominal tenderness. There is no guarding.  Musculoskeletal:        General: Normal range of motion.     Cervical back: Normal range of motion and neck supple.     Right lower leg: No edema.     Left lower leg: No edema.  Skin:    General: Skin is warm and dry.     Findings: No rash.  Neurological:      Mental Status: She is alert.     Motor: No abnormal muscle tone.     Coordination: Coordination normal.    ED Results / Procedures / Treatments   Labs (all labs ordered are listed, but only abnormal results are displayed) Labs Reviewed  BASIC METABOLIC PANEL - Abnormal; Notable for the following components:      Result Value   Glucose, Bld 112 (*)    All other components within normal limits  CBC WITH DIFFERENTIAL/PLATELET - Abnormal; Notable for the following components:   RBC 3.84 (*)    Hemoglobin 11.2 (*)    All other components within normal limits  TROPONIN I (HIGH SENSITIVITY)  TROPONIN I (HIGH SENSITIVITY)    EKG EKG Interpretation  Date/Time:  Tuesday Aug 19 2021 10:53:40 EDT Ventricular Rate:  73 PR Interval:  162 QRS Duration: 80 QT Interval:  400 QTC Calculation: 441 R Axis:   37 Text Interpretation: Sinus rhythm Probable left atrial enlargement Borderline T wave abnormalities similar to April 2022 Confirmed by Pricilla LovelessGoldston, Scott [phone]([id number]) on 08/19/2021 10:59:23 AM  Radiology DG Chest 2 View  Result Date: 08/19/2021 CLINICAL DATA:  Cough, wheezing. EXAM: CHEST - 2 VIEW COMPARISON:  None Available. FINDINGS: No consolidation. No visible pleural effusions or pneumothorax. Cardiomediastinal silhouette is within normal limits. No displaced fracture. IMPRESSION: No evidence of acute cardiopulmonary disease. Electronically Signed   By: Feliberto HartsFrederick S Jones M.D.   On: 08/19/2021 10:23    Procedures Procedures    Medications Ordered in ED Medications  ipratropium-albuterol (DUONEB) 0.5-2.5 (3) MG/3ML nebulizer solution 3 mL (3 mLs Nebulization Given 08/19/21 0952)  predniSONE (DELTASONE) tablet 60 mg (60 mg Oral Given 08/19/21 0951)    ED Course/ Medical Decision Making/ A&P Clinical Course as of 08/19/21 1408  Tue Aug 19, 2021  1049 Patient reports significant improvement after interventions. Breath sounds appear improved. [HK]  1125 Troponin I (High Sensitivity): 4 [HK]     Clinical Course User Index [HK] Dietrich PatesKhatri, Caeli Linehan, PA-C                           Medical Decision Making Amount and/or Complexity of Data Reviewed Labs: ordered. Decision-making details documented in ED Course. Radiology: ordered.  Risk Prescription drug management.   51 year old female with past medical history of asthma presenting to the ED with a chief complaint of wheezing, shortness of breath and cough productive with clear mucus.  Symptoms going on for the past few days but manageable with her albuterol inhaler at home.  Last night and this morning symptoms worsened.  Reports central chest tightness but denies any leg swelling.  On exam  patient with very slight wheezing noted bilaterally but no tachycardia, tachypnea or signs of respiratory distress.  No lower extremity edema, erythema or calf tenderness bilaterally.  Vital signs are within normal limits, she is hypertensive here.  Will obtain labs, EKG, chest x-ray and attempt to treat symptomatically for asthma with DuoNeb and prednisone.   EKG shows sinus rhythm, no changes from prior tracings.  No STEMI.  Chest x-ray shows no acute findings.  CBC, BMP, initial delta troponin are both negative.  Patient with significant improvement after DuoNeb and prednisone given.  She remains hemodynamically stable, normal oxygen saturation and not tachypneic or tachycardic.  She is in no acute distress.  I suspect that her symptoms are due to asthma exacerbation.  I doubt acute on chronic CHF, ACS, PE or other emergent cause of her chest tightness or wheezing.  We will give her remainder of steroid course.  She states that she has inhalers at home and is declining refills.  Patient is agreeable to the plan.  Return precautions given.  All imaging, if done today, including plain films, CT scans, and ultrasounds, independently reviewed by me, and interpretations confirmed via formal radiology reads.  Patient is hemodynamically stable, in NAD, and able  to ambulate in the ED. Evaluation does not show pathology that would require ongoing emergent intervention or inpatient treatment. I explained the diagnosis to the patient. Pain has been managed and has no complaints prior to discharge. Patient is comfortable with above plan and is stable for discharge at this time. All questions were answered prior to disposition. Strict return precautions for returning to the ED were discussed. Encouraged follow up with PCP.   An After Visit Summary was printed and given to the patient.   Portions of this note were generated with Scientist, clinical (histocompatibility and immunogenetics). Dictation errors may occur despite best attempts at proofreading.        Final Clinical Impression(s) / ED Diagnoses Final diagnoses:  Mild intermittent asthma with exacerbation    Rx / DC Orders ED Discharge Orders          Ordered    predniSONE (DELTASONE) 10 MG tablet  Daily        08/19/21 1353              Dietrich Pates, PA-C 08/19/21 1408    Pricilla Loveless, MD 08/19/21 1624

## 2021-08-19 NOTE — Discharge Instructions (Addendum)
I have given you the remainder of your steroid course to begin taking tomorrow.  This will help with your asthma exacerbation. Continue using your inhaler as needed to help with your wheezing. Return to the ER if you start to experience worsening shortness of breath, wheezing, chest tightness or chest pain, leg swelling

## 2021-08-19 NOTE — ED Triage Notes (Signed)
Pt reports wheezing and coughing that started last night. Pt reports using an inhaler last night with minimal relief.

## 2021-08-19 NOTE — ED Notes (Signed)
Patient transported to CT 

## 2021-08-26 ENCOUNTER — Encounter: Payer: Self-pay | Admitting: *Deleted

## 2021-11-10 IMAGING — MG MM DIGITAL SCREENING BILAT W/ TOMO AND CAD
6 of 12 series · 6 of 36 positions shown · non-contrast
Comparison: Previous exam(s).

CLINICAL DATA: Screening.

EXAM:
DIGITAL SCREENING BILATERAL MAMMOGRAM WITH TOMOSYNTHESIS AND CAD
TECHNIQUE: Bilateral screening digital craniocaudal and mediolateral oblique
mammograms were obtained. Bilateral screening digital breast
tomosynthesis was performed. The images were evaluated with
computer-aided detection.

[L CC synth-2D]
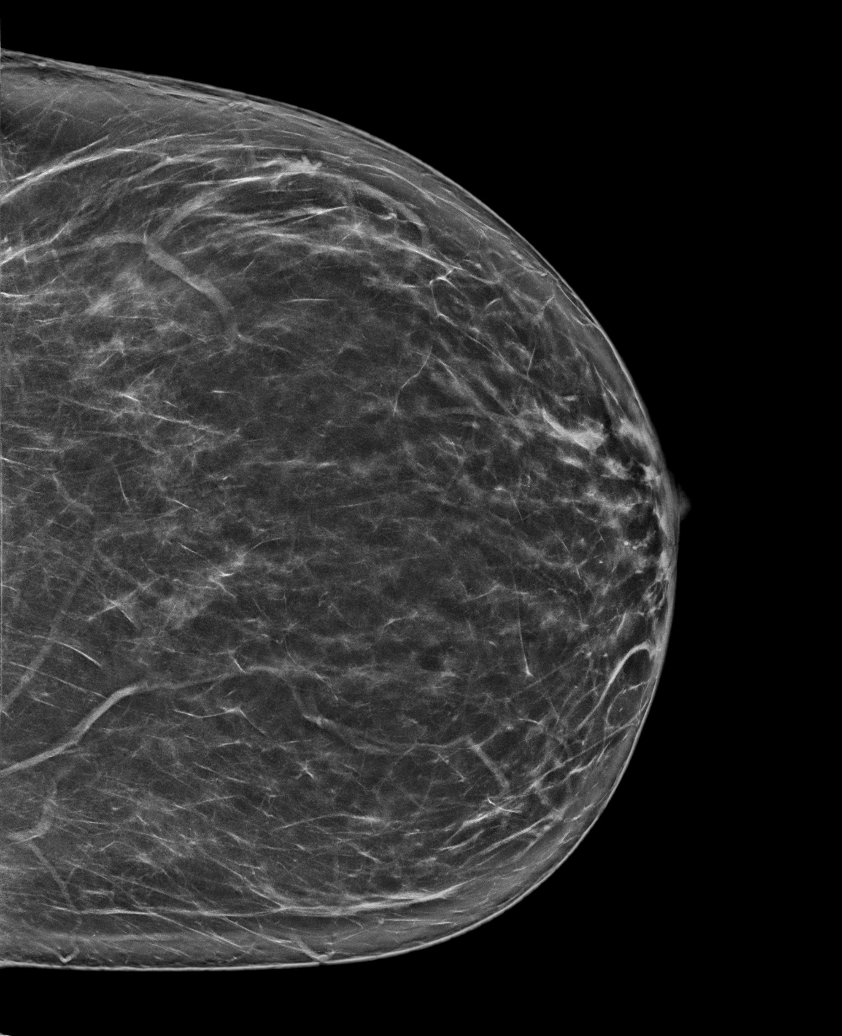

[R MLO synth-2D (1 of 2)]
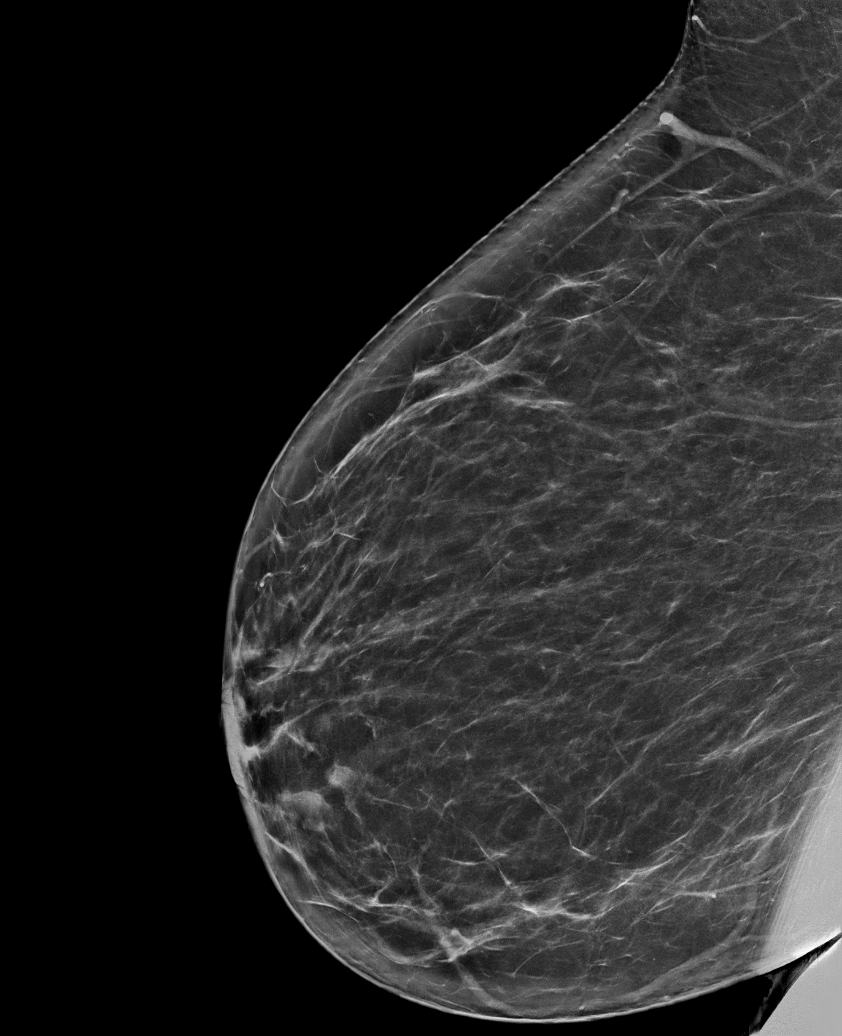

[R MLO synth-2D (2 of 2)]
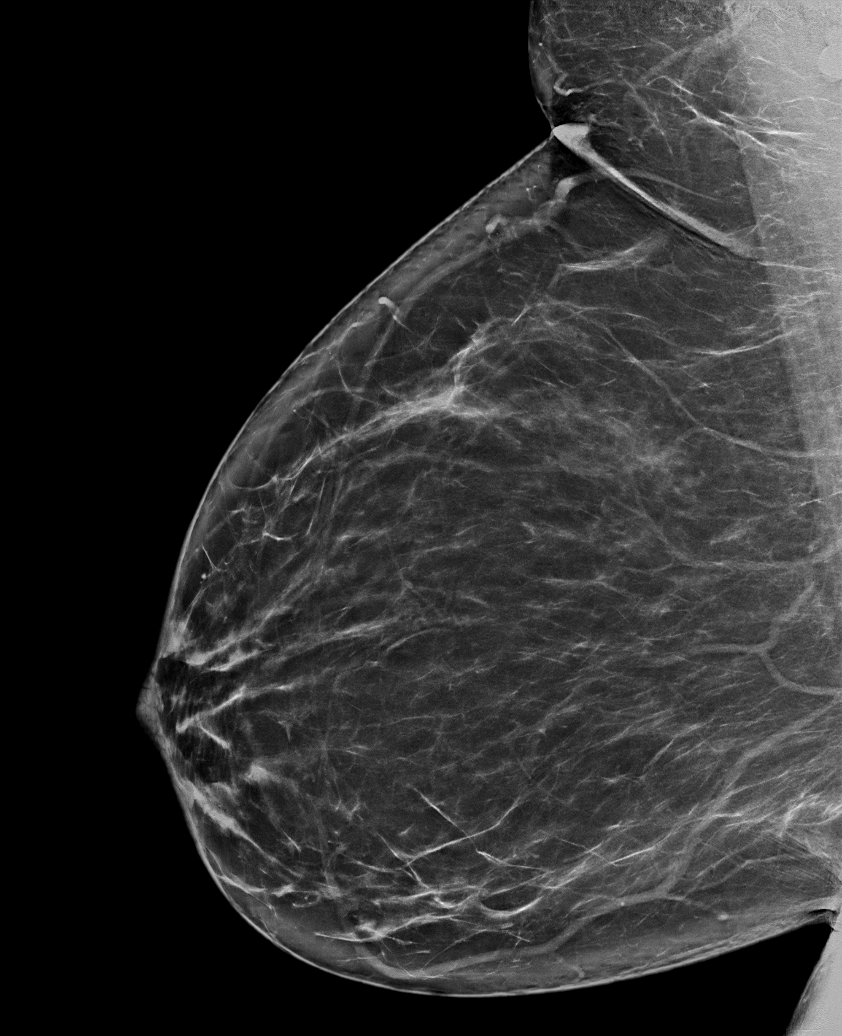

[R CC synth-2D]
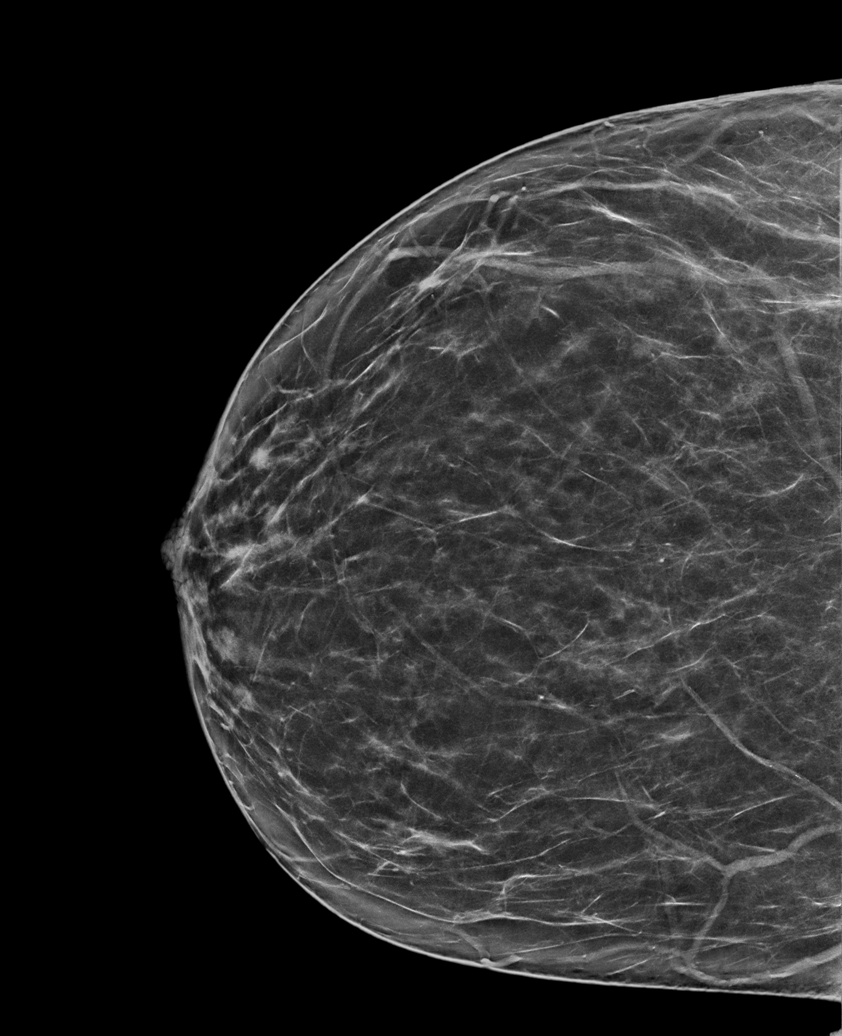

[L MLO synth-2D (1 of 2)]
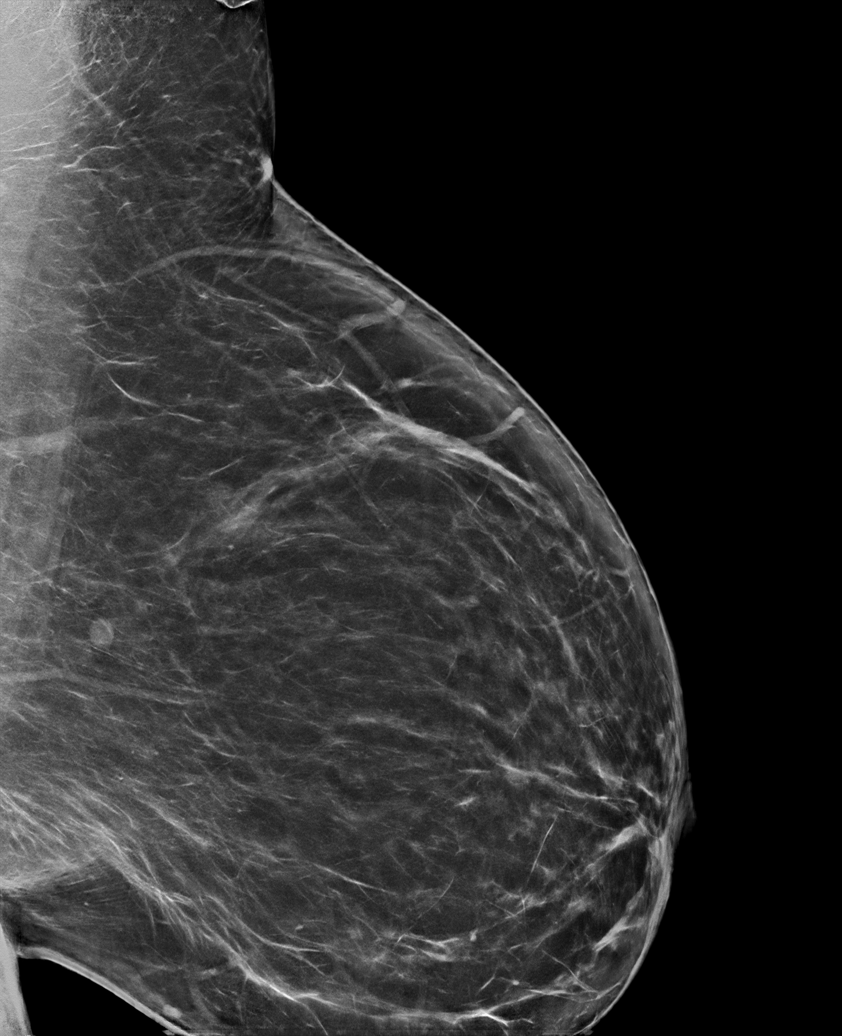

[L MLO synth-2D (2 of 2)]
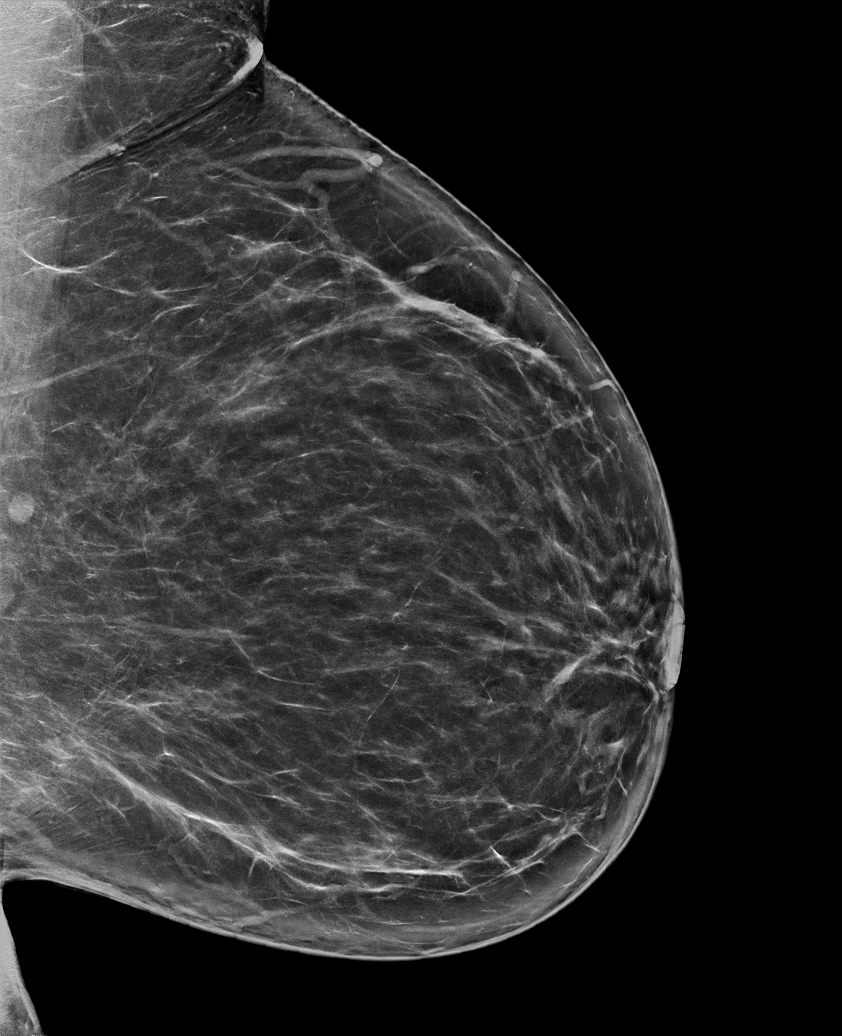

[6 of 36 positions shown; findings below may reference images not displayed]

ACR Breast Density Category b: There are scattered areas of
fibroglandular density.
FINDINGS: There are no findings suspicious for malignancy. The images were
evaluated with computer-aided detection.
IMPRESSION: No mammographic evidence of malignancy. A result letter of this
screening mammogram will be mailed directly to the patient.

RECOMMENDATION:
Screening mammogram in one year. (Code:WJ-I-BG6)

BI-RADS CATEGORY  1: Negative.

## 2021-12-05 ENCOUNTER — Other Ambulatory Visit: Payer: Self-pay

## 2021-12-05 DIAGNOSIS — D500 Iron deficiency anemia secondary to blood loss (chronic): Secondary | ICD-10-CM

## 2021-12-08 ENCOUNTER — Inpatient Hospital Stay: Payer: Medicaid Other | Attending: Oncology

## 2021-12-08 DIAGNOSIS — R5383 Other fatigue: Secondary | ICD-10-CM | POA: Insufficient documentation

## 2021-12-08 DIAGNOSIS — D500 Iron deficiency anemia secondary to blood loss (chronic): Secondary | ICD-10-CM | POA: Insufficient documentation

## 2021-12-08 DIAGNOSIS — N920 Excessive and frequent menstruation with regular cycle: Secondary | ICD-10-CM | POA: Insufficient documentation

## 2021-12-08 DIAGNOSIS — Z79899 Other long term (current) drug therapy: Secondary | ICD-10-CM | POA: Insufficient documentation

## 2021-12-08 DIAGNOSIS — Z8249 Family history of ischemic heart disease and other diseases of the circulatory system: Secondary | ICD-10-CM | POA: Insufficient documentation

## 2021-12-08 LAB — CBC WITH DIFFERENTIAL/PLATELET
Abs Immature Granulocytes: 0.02 10*3/uL (ref 0.00–0.07)
Basophils Absolute: 0 10*3/uL (ref 0.0–0.1)
Basophils Relative: 0 %
Eosinophils Absolute: 0.1 10*3/uL (ref 0.0–0.5)
Eosinophils Relative: 3 %
HCT: 31.8 % — ABNORMAL LOW (ref 36.0–46.0)
Hemoglobin: 8.8 g/dL — ABNORMAL LOW (ref 12.0–15.0)
Immature Granulocytes: 0 %
Lymphocytes Relative: 30 %
Lymphs Abs: 1.5 10*3/uL (ref 0.7–4.0)
MCH: 23.1 pg — ABNORMAL LOW (ref 26.0–34.0)
MCHC: 27.7 g/dL — ABNORMAL LOW (ref 30.0–36.0)
MCV: 83.5 fL (ref 80.0–100.0)
Monocytes Absolute: 0.4 10*3/uL (ref 0.1–1.0)
Monocytes Relative: 9 %
Neutro Abs: 2.8 10*3/uL (ref 1.7–7.7)
Neutrophils Relative %: 58 %
Platelets: 382 10*3/uL (ref 150–400)
RBC: 3.81 MIL/uL — ABNORMAL LOW (ref 3.87–5.11)
RDW: 21.1 % — ABNORMAL HIGH (ref 11.5–15.5)
WBC: 4.9 10*3/uL (ref 4.0–10.5)
nRBC: 0 % (ref 0.0–0.2)

## 2021-12-08 LAB — IRON AND TIBC
Iron: 25 ug/dL — ABNORMAL LOW (ref 28–170)
Saturation Ratios: 5 % — ABNORMAL LOW (ref 10.4–31.8)
TIBC: 479 ug/dL — ABNORMAL HIGH (ref 250–450)
UIBC: 454 ug/dL

## 2021-12-08 LAB — FERRITIN: Ferritin: 10 ng/mL — ABNORMAL LOW (ref 11–307)

## 2021-12-10 ENCOUNTER — Encounter: Payer: Self-pay | Admitting: Oncology

## 2021-12-10 ENCOUNTER — Inpatient Hospital Stay (HOSPITAL_BASED_OUTPATIENT_CLINIC_OR_DEPARTMENT_OTHER): Payer: Medicaid Other | Admitting: Oncology

## 2021-12-10 ENCOUNTER — Ambulatory Visit: Payer: Medicaid Other

## 2021-12-10 VITALS — BP 137/71 | HR 66 | Temp 97.8°F | Ht 62.0 in | Wt 247.7 lb

## 2021-12-10 DIAGNOSIS — D500 Iron deficiency anemia secondary to blood loss (chronic): Secondary | ICD-10-CM

## 2021-12-10 DIAGNOSIS — N921 Excessive and frequent menstruation with irregular cycle: Secondary | ICD-10-CM

## 2021-12-10 MED ORDER — ASCORBIC ACID 500 MG PO TABS: 500.0000 mg | ORAL_TABLET | Freq: Every day | ORAL | 1 refills | Status: DC

## 2021-12-10 MED ORDER — FERROUS SULFATE 325 (65 FE) MG PO TABS: 325.0000 mg | ORAL_TABLET | Freq: Two times a day (BID) | ORAL | 1 refills | Status: DC

## 2021-12-10 NOTE — Progress Notes (Signed)
Hematology/Oncology Progress note [phone] [phone]     Patient Care Team: Bess Kinds, MD as PCP - General (Family Medicine) Jim Like, RN as Registered Nurse Scarlett Presto, RN (Inactive) as Registered Nurse Rickard Patience, MD as Consulting Physician (Hematology and Oncology)   Name of the patient: Joanne Hampton  [id number]  October 13, 1970   REASON FOR VISIT Iron deficiency anemia  PERTINENT HEMATOLOGY HISTORY Patient previously followed up by Dr.Corcoran, patient switched care to me on 05/08/21 Extensive medical record review was performed by me 06/27/2020 - 06/28/2020 hospitalization with a hemoglobin of 4.2.  She received 3 units of PRBCs.  She received Venofer 200 mg IV on 06/28/2020.  She was discharged on oral iron Never had colonoscopy.    INTERVAL HISTORY Joanne Hampton is a 51 y.o. female who has above history reviewed by me today presents for follow up visit for management of iron deficiency anemia secondary to chronic blood loss. Patient takes oral iron supplementation once daily Overall she tolerates well +Fatigue  Menstrual flow is  heavy    Review of Systems  Constitutional:  Positive for fatigue. Negative for appetite change, chills and fever.  HENT:   Negative for hearing loss and voice change.   Eyes:  Negative for eye problems.  Respiratory:  Negative for chest tightness and cough.   Cardiovascular:  Negative for chest pain.  Gastrointestinal:  Negative for abdominal distention, abdominal pain and blood in stool.  Endocrine: Negative for hot flashes.  Genitourinary:  Positive for menstrual problem. Negative for difficulty urinating and frequency.   Musculoskeletal:  Negative for arthralgias.  Skin:  Negative for itching and rash.  Neurological:  Negative for extremity weakness.  Hematological:  Negative for adenopathy.  Psychiatric/Behavioral:  Negative for confusion.       No Known Allergies   Past  Medical History:  Diagnosis Date   Bronchitis    Menorrhagia      Past Surgical History:  Procedure Laterality Date   CESAREAN SECTION  1990   TUBAL LIGATION      Social History   Socioeconomic History   Marital status: Divorced    Spouse name: Not on file   Number of children: Not on file   Years of education: Not on file   Highest education level: Not on file  Occupational History   Not on file  Tobacco Use   Smoking status: Never   Smokeless tobacco: Never  Vaping Use   Vaping Use: Never used  Substance and Sexual Activity   Alcohol use: Yes    Comment: occasional   Drug use: No   Sexual activity: Not Currently    Partners: Male    Birth control/protection: Surgical  Other Topics Concern   Not on file  Social History Narrative   Not on file   Social Determinants of Health   Financial Resource Strain: Not on file  Food Insecurity: Not on file  Transportation Needs: Not on file  Physical Activity: Not on file  Stress: Not on file  Social Connections: Not on file  Intimate Partner Violence: Not on file    Family History  Problem Relation Age of Onset   Heart failure Mother      Current Outpatient Medications:    albuterol (VENTOLIN HFA) 108 (90 Base) MCG/ACT inhaler, INHALE 2 PUFFS INTO THE LUNGS EVERY 6 HOURS AS NEEDED FOR WHEEZING OR SHORTNESS OF BREATH, Disp: 6.7 g, Rfl: 0   Multiple Vitamin (MULTIVITAMIN) tablet, Take 1 tablet  by mouth daily., Disp: , Rfl:    ascorbic acid (VITAMIN C) 500 MG tablet, Take 1 tablet (500 mg total) by mouth daily., Disp: 90 tablet, Rfl: 1   ferrous sulfate 325 (65 FE) MG tablet, Take 1 tablet (325 mg total) by mouth 2 (two) times daily with a meal., Disp: 180 tablet, Rfl: 1  Physical exam:  Vitals:   12/10/21 1329  BP: 137/71  Pulse: 66  Temp: 97.8 F (36.6 C)  TempSrc: Tympanic  SpO2: 100%  Weight: 247 lb 11.2 oz (112.4 kg)  Height: 5\' 2"  (1.575 m)   Physical Exam Constitutional:      General: She is not  in acute distress.    Appearance: She is obese.  HENT:     Head: Normocephalic.  Eyes:     General: No scleral icterus. Cardiovascular:     Rate and Rhythm: Normal rate and regular rhythm.  Pulmonary:     Effort: Pulmonary effort is normal. No respiratory distress.     Breath sounds: No wheezing.  Abdominal:     General: Bowel sounds are normal.     Palpations: Abdomen is soft.  Musculoskeletal:        General: No deformity. Normal range of motion.     Cervical back: Normal range of motion and neck supple.  Skin:    General: Skin is warm and dry.  Neurological:     Mental Status: She is alert and oriented to person, place, and time. Mental status is at baseline.     Cranial Nerves: No cranial nerve deficit.     Coordination: Coordination normal.  Psychiatric:        Mood and Affect: Mood normal.     Labs    Latest Ref Rng & Units 12/08/2021   10:19 AM 08/19/2021   10:06 AM 08/05/2021    9:46 AM  CBC  WBC 4.0 - 10.5 K/uL 4.9  4.5  5.2   Hemoglobin 12.0 - 15.0 g/dL 8.8  11.2  11.1   Hematocrit 36.0 - 46.0 % 31.8  37.3  37.0   Platelets 150 - 400 K/uL 382  320  398       Latest Ref Rng & Units 08/19/2021   10:06 AM 06/28/2020    4:28 AM 06/27/2020    4:03 PM  CMP  Glucose 70 - 99 mg/dL 112  101  104   BUN 6 - 20 mg/dL 8  11  10    Creatinine 0.44 - 1.00 mg/dL 0.74  0.61  0.77   Sodium 135 - 145 mmol/L 137  138  136   Potassium 3.5 - 5.1 mmol/L 4.1  4.3  3.8   Chloride 98 - 111 mmol/L 102  106  104   CO2 22 - 32 mmol/L 28  25  24    Calcium 8.9 - 10.3 mg/dL 9.0  8.5  9.1   Total Protein 6.5 - 8.1 g/dL  7.0  8.2   Total Bilirubin 0.3 - 1.2 mg/dL  0.4  0.4   Alkaline Phos 38 - 126 U/L  71  75   AST 15 - 41 U/L  13  18   ALT 0 - 44 U/L  10  12    Lab Results  Component Value Date   IRON 25 (L) 12/08/2021   TIBC 479 (H) 12/08/2021   FERRITIN 10 (L) 12/08/2021      RADIOGRAPHIC STUDIES: I have personally reviewed the radiological images as listed and agreed with the  findings in the report. No results found.   Assessment and plan  1. Iron deficiency anemia due to chronic blood loss   2. Menorrhagia with irregular cycle    #Iron deficiency anemia due to chronic blood loss/menorrhagia Labs are reviewed and discussed with patient. Hemoglobin decreased.  Iron panel is consistent with severe iron deficiency.  Patient declines IV Venofer.  She prefers to try oral iron supplementation.  Recommend increase oral iron supplementation to BID, also take vitamin C supplementation.   Menorrhagia, recommend patient to get gyn evaluation.   Orders Placed This Encounter  Procedures   Iron and TIBC    Standing Status:   Future    Standing Expiration Date:   12/11/2022   Retic Panel    Standing Status:   Future    Standing Expiration Date:   12/11/2022   CBC with Differential/Platelet    Standing Status:   Future    Standing Expiration Date:   12/11/2022   Ferritin    Standing Status:   Future    Standing Expiration Date:   12/11/2022    Follow-up in 3 months   Rickard Patience, MD, PhD [facility] Hematology Oncology 12/10/2021

## 2021-12-10 NOTE — Patient Instructions (Signed)
recommend patient to discuss with Pcp for gyn

## 2021-12-11 ENCOUNTER — Encounter: Payer: Self-pay | Admitting: Family Medicine

## 2021-12-11 ENCOUNTER — Ambulatory Visit (INDEPENDENT_AMBULATORY_CARE_PROVIDER_SITE_OTHER): Payer: Self-pay | Admitting: Family Medicine

## 2021-12-11 VITALS — BP 135/79 | HR 71 | Ht 64.0 in | Wt 246.0 lb

## 2021-12-11 DIAGNOSIS — Z0000 Encounter for general adult medical examination without abnormal findings: Secondary | ICD-10-CM

## 2021-12-11 DIAGNOSIS — Z1211 Encounter for screening for malignant neoplasm of colon: Secondary | ICD-10-CM

## 2021-12-11 DIAGNOSIS — D500 Iron deficiency anemia secondary to blood loss (chronic): Secondary | ICD-10-CM

## 2021-12-11 NOTE — Progress Notes (Signed)
    SUBJECTIVE:   CHIEF COMPLAINT / HPI: Anemia follow-up  Anemia - Patient reports 2-year history of menorrhagia for which she went to the hospital with original hemoglobin of 4.2.  She was treated and referred to a gynecologist who evaluated her for possible uterine malignancy that was negative has been on iron supplementation ever since and sees a hematologist.  She takes oral iron, vitamin c at home.  She denies denies chest pain, lightheaded or dizziness, eating and drinking normally.  She has shortness of breath with exertion regularly, and has previously had any handicap parking placards for this reason, is here to renew 1 today.  She has not been on medications to control cycle bleeding.  Not seen her gynecologist in over a year.   Health Maintenance - Patient does not want flu vaccine, discussed need for colonoscopy condition given her continued anemia.  PERTINENT  PMH / PSH: HTN, Iron Deficiency Anemia  OBJECTIVE:   BP 135/79   Pulse 71   Ht 5\' 4"  (1.626 m)   Wt 246 lb (111.6 kg)   LMP 11/05/2021   SpO2 100%   BMI 42.23 kg/m   General: NAD  Cardiovascular: RRR, no murmurs, no peripheral edema Respiratory: normal WOB on RA, CTAB, no wheezes, ronchi or rales Extremities: Moving all 4 extremities equally   ASSESSMENT/PLAN:   Iron deficiency anemia due to chronic blood loss Patient has complicated 2-year history of menorrhagia with menstrual cycles.  Previously was evaluated by gynecology with nonmalignant findings.  However hemoglobin dropped to 8.8 yet 3 days ago on visit with her hematologist.  They recommended following up with gynecology as well as colonoscopy.  Vitals are stable and patient's only symptom is shortness of breath with exertion. -Provided phone number for gynecologist that patient has previously seen at [facility] obstetrics and gynecology, instructed to call to set up appointment for evaluation and Pap smear -Referred to GI for colonoscopy, discussed with  patient possible causes of now decreasing hemoglobin -Continue oral daily twice daily iron supplementation and daily vitamin C -Filled out handicap parking placard paperwork for shortness of breath  Healthcare maintenance Discussed with patient following up with primary care provider for healthcare maintenance, and also to discuss asthma diagnosis and her albuterol use. -Structured to follow-up in 1 month -Colonoscopy GI referral     Salvadore Oxford, MD [place]

## 2021-12-11 NOTE — Assessment & Plan Note (Signed)
Discussed with patient following up with primary care provider for healthcare maintenance, and also to discuss asthma diagnosis and her albuterol use. -Structured to follow-up in 1 month -Colonoscopy GI referral

## 2021-12-11 NOTE — Patient Instructions (Addendum)
It was great to see you! Thank you for allowing me to participate in your care!  I recommend that you always bring your medications to each appointment as this makes it easy to ensure we are on the correct medications and helps [place] not miss when refills are needed.  Our plans for today:  - Here is the phone number for your Gynecologist [phone] please call to set up an appointment. - Please consider a colonoscopy to screen for colon cancer.  - Please see you PCP in one month discuss your general health and asthma.   Take care and seek immediate care sooner if you develop any concerns.   Dr. Salvadore Oxford, MD [place]

## 2021-12-11 NOTE — Assessment & Plan Note (Addendum)
Patient has complicated 2-year history of menorrhagia with menstrual cycles.  Previously was evaluated by gynecology with nonmalignant findings.  However hemoglobin dropped to 8.8 yet 3 days ago on visit with her hematologist.  They recommended following up with gynecology as well as colonoscopy.  Vitals are stable and patient's only symptom is shortness of breath with exertion. -Provided phone number for gynecologist that patient has previously seen at [place] and gynecology, instructed to call to set up appointment for evaluation and Pap smear -Referred to GI for colonoscopy, discussed with patient possible causes of now decreasing hemoglobin -Continue oral daily twice daily iron supplementation and daily vitamin C -Filled out handicap parking placard paperwork for shortness of breath

## 2022-01-02 ENCOUNTER — Telehealth: Payer: Self-pay

## 2022-01-02 NOTE — Telephone Encounter (Signed)
Patient calls nurse line requesting a note for work.   Patient reports she has been out of work for 3 days due to her periods. Patient reports she has low iron and therefore is easily fatigued.   Patient reports she is planning on returning to work on Monday, however they are requesting a note stating she can perform her job duties safely as she is a Geophysicist/field seismologist.   Patient advised she would need to be seen before we could provide a note.   Patient scheduled for 10/16.

## 2022-01-05 ENCOUNTER — Ambulatory Visit (INDEPENDENT_AMBULATORY_CARE_PROVIDER_SITE_OTHER): Payer: Self-pay | Admitting: Family Medicine

## 2022-01-05 VITALS — BP 132/80 | HR 79 | Wt 248.6 lb

## 2022-01-05 DIAGNOSIS — R5383 Other fatigue: Secondary | ICD-10-CM

## 2022-01-05 NOTE — Progress Notes (Signed)
    SUBJECTIVE:   CHIEF COMPLAINT / HPI:   Patient presents for return to work note. She was seen here in clinic on 9/21 for anemia given her history of menorrhagia.  Her hemoglobin was checked days prior and was 8.8.  Was recommended to follow-up with gynecology and was referred to GI for a colonoscopy.  She continued on daily iron supplementation.  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 Today cycle is over, feeling good back to normal and ready to return to work. Denies fatigue or any symptoms. Currently undergoing workup with her PCP, GI, and gyn.   PERTINENT  PMH / PSH: Reviewed   OBJECTIVE:   BP 132/80   Pulse 79   Wt 248 lb 9.6 oz (112.8 kg)   LMP 11/05/2021   SpO2 99%   BMI 42.67 kg/m    Physical exam General: well appearing, NAD Cardiovascular: RRR, no murmurs Lungs: CTAB. Normal WOB Abdomen: soft, non-distended, non-tender Skin: warm, dry. No edema  ASSESSMENT/PLAN:   No problem-specific Assessment & Plan notes found for this encounter.   Fatigue Resolved. Due to heavy bleeding during her menstrual cycle which is new for her but she is undergoing workup with PCP, Gyn, and GI. Today she is feeling good, denies fatigue or abdominal pain and is back at baseline.  Provided note stating that she is able to return to work. She will continue her daily iron supplementation and follow up with specialists as discussed previously on 9/21   Shary Key, [place]

## 2022-01-05 NOTE — Patient Instructions (Signed)
It was great seeing you today!  I am glad you are feeling better! I have written a note stating you can return to work   Feel free to call with any questions or concerns at any time, at [phone].   Take care,  Dr. Shary Key [facility]

## 2022-02-20 ENCOUNTER — Encounter: Payer: Self-pay | Admitting: Hematology and Oncology

## 2022-03-11 ENCOUNTER — Inpatient Hospital Stay: Payer: Medicaid Other | Attending: Oncology

## 2022-03-11 DIAGNOSIS — Z79899 Other long term (current) drug therapy: Secondary | ICD-10-CM | POA: Insufficient documentation

## 2022-03-11 DIAGNOSIS — Z8249 Family history of ischemic heart disease and other diseases of the circulatory system: Secondary | ICD-10-CM | POA: Insufficient documentation

## 2022-03-11 DIAGNOSIS — N920 Excessive and frequent menstruation with regular cycle: Secondary | ICD-10-CM | POA: Insufficient documentation

## 2022-03-11 DIAGNOSIS — D500 Iron deficiency anemia secondary to blood loss (chronic): Secondary | ICD-10-CM | POA: Insufficient documentation

## 2022-03-11 DIAGNOSIS — R5383 Other fatigue: Secondary | ICD-10-CM | POA: Insufficient documentation

## 2022-03-11 DIAGNOSIS — D75839 Thrombocytosis, unspecified: Secondary | ICD-10-CM | POA: Insufficient documentation

## 2022-03-19 ENCOUNTER — Encounter: Payer: Self-pay | Admitting: Oncology

## 2022-03-19 ENCOUNTER — Inpatient Hospital Stay: Payer: Medicaid Other

## 2022-03-19 ENCOUNTER — Inpatient Hospital Stay (HOSPITAL_BASED_OUTPATIENT_CLINIC_OR_DEPARTMENT_OTHER): Payer: Medicaid Other | Admitting: Oncology

## 2022-03-19 VITALS — BP 151/85 | HR 76 | Temp 98.7°F | Resp 18 | Wt 245.5 lb

## 2022-03-19 DIAGNOSIS — D75839 Thrombocytosis, unspecified: Secondary | ICD-10-CM | POA: Diagnosis not present

## 2022-03-19 DIAGNOSIS — N920 Excessive and frequent menstruation with regular cycle: Secondary | ICD-10-CM | POA: Diagnosis not present

## 2022-03-19 DIAGNOSIS — R5383 Other fatigue: Secondary | ICD-10-CM | POA: Diagnosis not present

## 2022-03-19 DIAGNOSIS — Z79899 Other long term (current) drug therapy: Secondary | ICD-10-CM | POA: Diagnosis not present

## 2022-03-19 DIAGNOSIS — Z8249 Family history of ischemic heart disease and other diseases of the circulatory system: Secondary | ICD-10-CM | POA: Diagnosis not present

## 2022-03-19 DIAGNOSIS — D500 Iron deficiency anemia secondary to blood loss (chronic): Secondary | ICD-10-CM

## 2022-03-19 LAB — IRON AND TIBC
Iron: 28 ug/dL (ref 28–170)
Saturation Ratios: 7 % — ABNORMAL LOW (ref 10.4–31.8)
TIBC: 427 ug/dL (ref 250–450)
UIBC: 399 ug/dL

## 2022-03-19 LAB — RETIC PANEL
Immature Retic Fract: 18.5 % — ABNORMAL HIGH (ref 2.3–15.9)
RBC.: 3.75 MIL/uL — ABNORMAL LOW (ref 3.87–5.11)
Retic Count, Absolute: 93.5 10*3/uL (ref 19.0–186.0)
Retic Ct Pct: 2.5 % (ref 0.4–3.1)
Reticulocyte Hemoglobin: 23.4 pg — ABNORMAL LOW (ref >27.9)

## 2022-03-19 LAB — CBC WITH DIFFERENTIAL/PLATELET
Abs Immature Granulocytes: 0.02 10*3/uL (ref 0.00–0.07)
Basophils Absolute: 0 10*3/uL (ref 0.0–0.1)
Basophils Relative: 0 %
Eosinophils Absolute: 0.1 10*3/uL (ref 0.0–0.5)
Eosinophils Relative: 3 %
HCT: 33.1 % — ABNORMAL LOW (ref 36.0–46.0)
Hemoglobin: 9.5 g/dL — ABNORMAL LOW (ref 12.0–15.0)
Immature Granulocytes: 1 %
Lymphocytes Relative: 39 %
Lymphs Abs: 1.7 10*3/uL (ref 0.7–4.0)
MCH: 24.5 pg — ABNORMAL LOW (ref 26.0–34.0)
MCHC: 28.7 g/dL — ABNORMAL LOW (ref 30.0–36.0)
MCV: 85.3 fL (ref 80.0–100.0)
Monocytes Absolute: 0.4 10*3/uL (ref 0.1–1.0)
Monocytes Relative: 10 %
Neutro Abs: 2.1 10*3/uL (ref 1.7–7.7)
Neutrophils Relative %: 47 %
Platelets: 427 10*3/uL — ABNORMAL HIGH (ref 150–400)
RBC: 3.88 MIL/uL (ref 3.87–5.11)
RDW: 19.2 % — ABNORMAL HIGH (ref 11.5–15.5)
WBC: 4.4 10*3/uL (ref 4.0–10.5)
nRBC: 0 % (ref 0.0–0.2)

## 2022-03-19 LAB — FERRITIN: Ferritin: 37 ng/mL (ref 11–307)

## 2022-03-19 NOTE — Assessment & Plan Note (Signed)
Labs are reviewed and discussed with patient. Hb has improved, iron panel is still consistent with iron deficiency.  She declines IV Venofer treatments. Recommend patient to continue ferrous sulfate 325mg  BID. Continue vitamin c supplementation.

## 2022-03-19 NOTE — Progress Notes (Signed)
Hematology/Oncology Progress note [phone] [phone]     Patient Care Team: Bess Kinds, MD as PCP - General (Family Medicine) Jim Like, RN as Registered Nurse Scarlett Presto, RN (Inactive) as Registered Nurse Rickard Patience, MD as Consulting Physician (Hematology and Oncology)   REASON FOR VISIT Iron deficiency anemia  ASSESSMENT & PLAN:   Iron deficiency anemia due to chronic blood loss Labs are reviewed and discussed with patient. Hb has improved, iron panel is still consistent with iron deficiency.  She declines IV Venofer treatments. Recommend patient to continue ferrous sulfate 325mg  BID. Continue vitamin c supplementation.   Thrombocytosis Likely reactive due to iron deficiency.    Orders Placed This Encounter  Procedures   Ferritin    Standing Status:   Future    Standing Expiration Date:   09/18/2022   Iron and TIBC    Standing Status:   Future    Standing Expiration Date:   03/20/2023   CBC with Differential/Platelet    Standing Status:   Future    Standing Expiration Date:   03/20/2023   Retic Panel    Standing Status:   Future    Standing Expiration Date:   03/20/2023   Follow up in 4 months.  All questions were answered. The patient knows to call the clinic with any problems, questions or concerns.  03/22/2023, MD, PhD [facility] Hematology Oncology 03/19/2022    PERTINENT HEMATOLOGY HISTORY Patient previously followed up by Dr.Corcoran, patient switched care to me on 05/08/21 Extensive medical record review was performed by me 06/27/2020 - 06/28/2020 hospitalization with a hemoglobin of 4.2.  She received 3 units of PRBCs.  She received Venofer 200 mg IV on 06/28/2020.  She was discharged on oral iron Never had colonoscopy.    INTERVAL HISTORY Joanne Hampton is a 51 y.o. female who has above history reviewed by me today presents for follow up visit for management of iron deficiency anemia secondary to  chronic blood loss. Patient takes oral iron supplementation once daily Overall she tolerates well Menstrual flow is  heavy    Review of Systems  Constitutional:  Positive for fatigue. Negative for appetite change, chills and fever.  HENT:   Negative for hearing loss and voice change.   Eyes:  Negative for eye problems.  Respiratory:  Negative for chest tightness and cough.   Cardiovascular:  Negative for chest pain.  Gastrointestinal:  Negative for abdominal distention, abdominal pain and blood in stool.  Endocrine: Negative for hot flashes.  Genitourinary:  Positive for menstrual problem. Negative for difficulty urinating and frequency.   Musculoskeletal:  Negative for arthralgias.  Skin:  Negative for itching and rash.  Neurological:  Negative for extremity weakness.  Hematological:  Negative for adenopathy.  Psychiatric/Behavioral:  Negative for confusion.       No Known Allergies   Past Medical History:  Diagnosis Date   Bronchitis    Menorrhagia      Past Surgical History:  Procedure Laterality Date   CESAREAN SECTION  1990   TUBAL LIGATION      Social History   Socioeconomic History   Marital status: Divorced    Spouse name: Not on file   Number of children: Not on file   Years of education: Not on file   Highest education level: Not on file  Occupational History   Not on file  Tobacco Use   Smoking status: Never   Smokeless tobacco: Never  Vaping Use  Vaping Use: Never used  Substance and Sexual Activity   Alcohol use: Yes    Comment: occasional   Drug use: No   Sexual activity: Not Currently    Partners: Male    Birth control/protection: Surgical  Other Topics Concern   Not on file  Social History Narrative   Not on file   Social Determinants of Health   Financial Resource Strain: Not on file  Food Insecurity: Not on file  Transportation Needs: Not on file  Physical Activity: Not on file  Stress: Not on file  Social Connections: Not on  file  Intimate Partner Violence: Not on file    Family History  Problem Relation Age of Onset   Heart failure Mother      Current Outpatient Medications:    albuterol (VENTOLIN HFA) 108 (90 Base) MCG/ACT inhaler, INHALE 2 PUFFS INTO THE LUNGS EVERY 6 HOURS AS NEEDED FOR WHEEZING OR SHORTNESS OF BREATH, Disp: 6.7 g, Rfl: 0   ascorbic acid (VITAMIN C) 500 MG tablet, Take 1 tablet (500 mg total) by mouth daily., Disp: 90 tablet, Rfl: 1   ferrous sulfate 325 (65 FE) MG tablet, Take 1 tablet (325 mg total) by mouth 2 (two) times daily with a meal., Disp: 180 tablet, Rfl: 1   Multiple Vitamin (MULTIVITAMIN) tablet, Take 1 tablet by mouth daily., Disp: , Rfl:   Physical exam:  Vitals:   03/19/22 1503  BP: (!) 151/85  Pulse: 76  Resp: 18  Temp: 98.7 F (37.1 C)  Weight: 245 lb 8 oz (111.4 kg)   Physical Exam Constitutional:      General: She is not in acute distress.    Appearance: She is obese.  HENT:     Head: Normocephalic.  Eyes:     General: No scleral icterus. Cardiovascular:     Rate and Rhythm: Normal rate and regular rhythm.  Pulmonary:     Effort: Pulmonary effort is normal. No respiratory distress.     Breath sounds: No wheezing.  Abdominal:     General: Bowel sounds are normal.     Palpations: Abdomen is soft.  Musculoskeletal:        General: No deformity. Normal range of motion.     Cervical back: Normal range of motion and neck supple.  Skin:    General: Skin is warm and dry.  Neurological:     Mental Status: She is alert and oriented to person, place, and time. Mental status is at baseline.     Cranial Nerves: No cranial nerve deficit.     Coordination: Coordination normal.  Psychiatric:        Mood and Affect: Mood normal.     Labs    Latest Ref Rng & Units 03/19/2022    3:45 PM 12/08/2021   10:19 AM 08/19/2021   10:06 AM  CBC  WBC 4.0 - 10.5 K/uL 4.4  4.9  4.5   Hemoglobin 12.0 - 15.0 g/dL 9.5  8.8  51.0   Hematocrit 36.0 - 46.0 % 33.1  31.8   37.3   Platelets 150 - 400 K/uL 427  382  320       Latest Ref Rng & Units 08/19/2021   10:06 AM 06/28/2020    4:28 AM 06/27/2020    4:03 PM  CMP  Glucose 70 - 99 mg/dL 258  527  782   BUN 6 - 20 mg/dL 8  11  10    Creatinine 0.44 - 1.00 mg/dL  4.23  0.77   Sodium 135 - 145 mmol/L 137  138  136   Potassium 3.5 - 5.1 mmol/L 4.1  4.3  3.8   Chloride 98 - 111 mmol/L 102  106  104   CO2 22 - 32 mmol/L 28  25  24    Calcium 8.9 - 10.3 mg/dL 9.0  8.5  9.1   Total Protein 6.5 - 8.1 g/dL  7.0  8.2   Total Bilirubin 0.3 - 1.2 mg/dL  0.4  0.4   Alkaline Phos 38 - 126 U/L  71  75   AST 15 - 41 U/L  13  18   ALT 0 - 44 U/L  10  12    Lab Results  Component Value Date   IRON 28 03/19/2022   TIBC 427 03/19/2022   FERRITIN 37 03/19/2022      RADIOGRAPHIC STUDIES: I have personally reviewed the radiological images as listed and agreed with the findings in the report. No results found.

## 2022-03-19 NOTE — Assessment & Plan Note (Signed)
Likely reactive due to iron deficiency.

## 2022-03-20 ENCOUNTER — Telehealth: Payer: Self-pay

## 2022-03-20 NOTE — Telephone Encounter (Signed)
Spoke to pt and informed her of MD recommendation and follow up plan.   Please schedule and inform pt of appts:  Labs in 4 months  MD 1-2 days AFTER labs

## 2022-03-20 NOTE — Telephone Encounter (Signed)
-----   Message from Rickard Patience, MD sent at 03/19/2022  9:59 PM EST ----- Please let her know that blood and iron level are still low, recommend her to continue oral ferrous supplementation twice daily,and vitamin c once daily.  Recommend her to follow up in 4 months. Labs prior to MD, labs are ordered,

## 2022-06-26 ENCOUNTER — Telehealth: Payer: Self-pay | Admitting: Student

## 2022-06-26 NOTE — Telephone Encounter (Signed)
patient dropped off form at front desk for [facility] Placard.  Verified that patient section of form has been completed.  Last DOS/WCC with PCP was 12/26/21.  Placed form in red team folder to be completed by clinical staff.  Vilinda Blanks

## 2022-06-29 NOTE — Telephone Encounter (Signed)
Placed in MDs box to be filled out. Mateus Rewerts, CMA  

## 2022-07-09 NOTE — Telephone Encounter (Signed)
Received VM checking the status of paperwork.   Will forward to PCP.

## 2022-07-10 NOTE — Telephone Encounter (Signed)
Form completed and placed in RN box. 

## 2022-07-13 NOTE — Telephone Encounter (Signed)
Form placed up front for pick up.   Copy made for batch scanning.   Attempted to call patient, however no answer or option for VM.

## 2022-07-17 ENCOUNTER — Inpatient Hospital Stay: Payer: Medicaid Other | Attending: Oncology

## 2022-07-17 DIAGNOSIS — D75839 Thrombocytosis, unspecified: Secondary | ICD-10-CM | POA: Insufficient documentation

## 2022-07-17 DIAGNOSIS — Z8249 Family history of ischemic heart disease and other diseases of the circulatory system: Secondary | ICD-10-CM | POA: Insufficient documentation

## 2022-07-17 DIAGNOSIS — N920 Excessive and frequent menstruation with regular cycle: Secondary | ICD-10-CM | POA: Insufficient documentation

## 2022-07-17 DIAGNOSIS — R5383 Other fatigue: Secondary | ICD-10-CM | POA: Diagnosis not present

## 2022-07-17 DIAGNOSIS — Z79899 Other long term (current) drug therapy: Secondary | ICD-10-CM | POA: Insufficient documentation

## 2022-07-17 DIAGNOSIS — D500 Iron deficiency anemia secondary to blood loss (chronic): Secondary | ICD-10-CM | POA: Insufficient documentation

## 2022-07-17 LAB — IRON AND TIBC
Iron: 72 ug/dL (ref 28–170)
Saturation Ratios: 16 % (ref 10.4–31.8)
TIBC: 463 ug/dL — ABNORMAL HIGH (ref 250–450)
UIBC: 391 ug/dL

## 2022-07-17 LAB — CBC WITH DIFFERENTIAL/PLATELET
Abs Immature Granulocytes: 0.07 10*3/uL (ref 0.00–0.07)
Basophils Absolute: 0 10*3/uL (ref 0.0–0.1)
Basophils Relative: 0 %
Eosinophils Absolute: 0.2 10*3/uL (ref 0.0–0.5)
Eosinophils Relative: 3 %
HCT: 26.9 % — ABNORMAL LOW (ref 36.0–46.0)
Hemoglobin: 7.9 g/dL — ABNORMAL LOW (ref 12.0–15.0)
Immature Granulocytes: 1 %
Lymphocytes Relative: 25 %
Lymphs Abs: 1.3 10*3/uL (ref 0.7–4.0)
MCH: 26.6 pg (ref 26.0–34.0)
MCHC: 29.4 g/dL — ABNORMAL LOW (ref 30.0–36.0)
MCV: 90.6 fL (ref 80.0–100.0)
Monocytes Absolute: 0.5 10*3/uL (ref 0.1–1.0)
Monocytes Relative: 9 %
Neutro Abs: 3.4 10*3/uL (ref 1.7–7.7)
Neutrophils Relative %: 62 %
Platelets: 369 10*3/uL (ref 150–400)
RBC: 2.97 MIL/uL — ABNORMAL LOW (ref 3.87–5.11)
RDW: 16 % — ABNORMAL HIGH (ref 11.5–15.5)
WBC: 5.4 10*3/uL (ref 4.0–10.5)
nRBC: 0 % (ref 0.0–0.2)

## 2022-07-17 LAB — RETIC PANEL
Immature Retic Fract: 37 % — ABNORMAL HIGH (ref 2.3–15.9)
RBC.: 2.95 MIL/uL — ABNORMAL LOW (ref 3.87–5.11)
Retic Count, Absolute: 85.8 10*3/uL (ref 19.0–186.0)
Retic Ct Pct: 2.9 % (ref 0.4–3.1)
Reticulocyte Hemoglobin: 21.9 pg — ABNORMAL LOW (ref >27.9)

## 2022-07-17 LAB — FERRITIN: Ferritin: 21 ng/mL (ref 11–307)

## 2022-07-24 ENCOUNTER — Inpatient Hospital Stay: Payer: Medicaid Other | Admitting: Oncology

## 2022-09-28 ENCOUNTER — Encounter: Payer: Self-pay | Admitting: Hematology and Oncology

## 2022-12-31 ENCOUNTER — Other Ambulatory Visit (HOSPITAL_COMMUNITY): Payer: Self-pay

## 2022-12-31 ENCOUNTER — Observation Stay (HOSPITAL_COMMUNITY)
Admission: EM | Admit: 2022-12-31 | Discharge: 2023-01-01 | Disposition: A | Discharge status: Home / Self Care | Payer: Medicaid Other | Attending: Family Medicine | Admitting: Family Medicine

## 2022-12-31 ENCOUNTER — Encounter: Payer: Self-pay | Admitting: Hematology and Oncology

## 2022-12-31 ENCOUNTER — Encounter (HOSPITAL_COMMUNITY): Payer: Self-pay

## 2022-12-31 ENCOUNTER — Emergency Department (HOSPITAL_COMMUNITY): Payer: Medicaid Other

## 2022-12-31 ENCOUNTER — Other Ambulatory Visit: Payer: Self-pay

## 2022-12-31 DIAGNOSIS — N938 Other specified abnormal uterine and vaginal bleeding: Secondary | ICD-10-CM | POA: Insufficient documentation

## 2022-12-31 DIAGNOSIS — D696 Thrombocytopenia, unspecified: Secondary | ICD-10-CM | POA: Diagnosis not present

## 2022-12-31 DIAGNOSIS — J45909 Unspecified asthma, uncomplicated: Secondary | ICD-10-CM | POA: Insufficient documentation

## 2022-12-31 DIAGNOSIS — D500 Iron deficiency anemia secondary to blood loss (chronic): Principal | ICD-10-CM | POA: Diagnosis present

## 2022-12-31 DIAGNOSIS — I10 Essential (primary) hypertension: Secondary | ICD-10-CM | POA: Diagnosis present

## 2022-12-31 DIAGNOSIS — N939 Abnormal uterine and vaginal bleeding, unspecified: Principal | ICD-10-CM

## 2022-12-31 DIAGNOSIS — D75839 Thrombocytosis, unspecified: Secondary | ICD-10-CM | POA: Diagnosis present

## 2022-12-31 DIAGNOSIS — D219 Benign neoplasm of connective and other soft tissue, unspecified: Secondary | ICD-10-CM

## 2022-12-31 DIAGNOSIS — N921 Excessive and frequent menstruation with irregular cycle: Secondary | ICD-10-CM

## 2022-12-31 DIAGNOSIS — R519 Headache, unspecified: Secondary | ICD-10-CM | POA: Diagnosis present

## 2022-12-31 LAB — BASIC METABOLIC PANEL
Anion gap: 11 (ref 5–15)
BUN: 6 mg/dL (ref 6–20)
CO2: 22 mmol/L (ref 22–32)
Calcium: 8.7 mg/dL — ABNORMAL LOW (ref 8.9–10.3)
Chloride: 106 mmol/L (ref 98–111)
Creatinine, Ser: 0.87 mg/dL (ref 0.44–1.00)
GFR, Estimated: >60 mL/min (ref >60)
Glucose, Bld: 132 mg/dL — ABNORMAL HIGH (ref 70–99)
Potassium: 4 mmol/L (ref 3.5–5.1)
Sodium: 139 mmol/L (ref 135–145)

## 2022-12-31 LAB — PROTIME-INR
INR: 1 (ref 0.8–1.2)
Prothrombin Time: 13.6 s (ref 11.4–15.2)

## 2022-12-31 LAB — IRON AND TIBC
Iron: 6 ug/dL — ABNORMAL LOW (ref 28–170)
Saturation Ratios: 1 % — ABNORMAL LOW (ref 10.4–31.8)
TIBC: 542 ug/dL — ABNORMAL HIGH (ref 250–450)
UIBC: 536 ug/dL

## 2022-12-31 LAB — CBC WITH DIFFERENTIAL/PLATELET
Abs Immature Granulocytes: 0.02 10*3/uL (ref 0.00–0.07)
Basophils Absolute: 0 10*3/uL (ref 0.0–0.1)
Basophils Relative: 0 %
Eosinophils Absolute: 0.1 10*3/uL (ref 0.0–0.5)
Eosinophils Relative: 1 %
HCT: 16.3 % — ABNORMAL LOW (ref 36.0–46.0)
Hemoglobin: 4.4 g/dL — CL (ref 12.0–15.0)
Immature Granulocytes: 0 %
Lymphocytes Relative: 19 %
Lymphs Abs: 1.4 10*3/uL (ref 0.7–4.0)
MCH: 22.4 pg — ABNORMAL LOW (ref 26.0–34.0)
MCHC: 27 g/dL — ABNORMAL LOW (ref 30.0–36.0)
MCV: 83.2 fL (ref 80.0–100.0)
Monocytes Absolute: 0.5 10*3/uL (ref 0.1–1.0)
Monocytes Relative: 7 %
Neutro Abs: 5.3 10*3/uL (ref 1.7–7.7)
Neutrophils Relative %: 73 %
Platelets: 424 10*3/uL — ABNORMAL HIGH (ref 150–400)
RBC: 1.96 MIL/uL — ABNORMAL LOW (ref 3.87–5.11)
RDW: 19.3 % — ABNORMAL HIGH (ref 11.5–15.5)
WBC: 7.3 10*3/uL (ref 4.0–10.5)
nRBC: 1.8 % — ABNORMAL HIGH (ref 0.0–0.2)

## 2022-12-31 LAB — RETICULOCYTES
Immature Retic Fract: 39.1 % — ABNORMAL HIGH (ref 2.3–15.9)
RBC.: 1.93 MIL/uL — ABNORMAL LOW (ref 3.87–5.11)
Retic Count, Absolute: 51.7 10*3/uL (ref 19.0–186.0)
Retic Ct Pct: 2.7 % (ref 0.4–3.1)

## 2022-12-31 LAB — PREPARE RBC (CROSSMATCH)

## 2022-12-31 LAB — VITAMIN B12: Vitamin B-12: 184 pg/mL (ref 180–914)

## 2022-12-31 LAB — FERRITIN: Ferritin: 2 ng/mL — ABNORMAL LOW (ref 11–307)

## 2022-12-31 LAB — HCG, SERUM, QUALITATIVE: Preg, Serum: NEGATIVE

## 2022-12-31 LAB — FOLATE: Folate: 11 ng/mL (ref >5.9)

## 2022-12-31 MED ORDER — ESTROGENS CONJUGATED 25 MG IJ SOLR
25.0000 mg | Freq: Four times a day (QID) | INTRAMUSCULAR | Status: DC
  Administered 2022-12-31 – 2023-01-01 (×2): 25 mg via INTRAVENOUS
  Filled 2022-12-31 (×9): qty 25

## 2022-12-31 MED ORDER — FERROUS SULFATE 325 (65 FE) MG PO TABS
325.0000 mg | ORAL_TABLET | Freq: Two times a day (BID) | ORAL | Status: DC
  Administered 2022-12-31 – 2023-01-01 (×3): 325 mg via ORAL
  Filled 2022-12-31 (×3): qty 1

## 2022-12-31 MED ORDER — MEGESTROL ACETATE 40 MG PO TABS
120.0000 mg | ORAL_TABLET | Freq: Every day | ORAL | Status: DC
  Administered 2023-01-01: 120 mg via ORAL
  Filled 2022-12-31 (×2): qty 3

## 2022-12-31 MED ORDER — LEUPROLIDE ACETATE 7.5 MG IM KIT
7.5000 mg | PACK | Freq: Once | INTRAMUSCULAR | Status: AC
  Administered 2023-01-01: 7.5 mg via INTRAMUSCULAR
  Filled 2022-12-31: qty 7.5

## 2022-12-31 MED ORDER — STERILE WATER FOR INJECTION IJ SOLN
INTRAMUSCULAR | Status: AC
  Filled 2022-12-31: qty 10

## 2022-12-31 MED ORDER — SODIUM CHLORIDE 0.9% IV SOLUTION: Freq: Once | INTRAVENOUS | Status: AC

## 2022-12-31 MED ORDER — LEUPROLIDE ACETATE 7.5 MG IM KIT: 7.5000 mg | PACK | Freq: Once | INTRAMUSCULAR | Status: DC

## 2022-12-31 MED ORDER — ALBUTEROL SULFATE (2.5 MG/3ML) 0.083% IN NEBU: 2.5000 mg | INHALATION_SOLUTION | Freq: Four times a day (QID) | RESPIRATORY_TRACT | Status: DC | PRN

## 2022-12-31 NOTE — Assessment & Plan Note (Signed)
Hypertensive on presentation to 141/62. Patient asymptomatic from this. BP continues to be elevated in 140s to 150s systolic. Patient otherwise hemodynamically stable. Suspect this is in the setting of acute stressors though does have documented history of HTN; will defer intervention at this time.  - Continue to monitor vitals

## 2022-12-31 NOTE — Assessment & Plan Note (Signed)
PLT elevated to 424 on admission. Upon review, patient with history of reactive thrombocytosis in the setting of IDA with PLT of 527 in 2022 and 429 in 10/2020. Previously with resolution following resolution of anemia. Suspect reactive thrombocytosis in the setting of acute anemia. Anticipate normalization in platelet count as this resolves. Patient with no symptoms of infection.  - CBC with diff in AM to monitor platelets

## 2022-12-31 NOTE — ED Provider Triage Note (Signed)
Emergency Medicine Provider Triage Evaluation Note  [place] , a 52 y.o. female  was evaluated in triage.  Pt complains of vaginal bleeding. Ongoing around 2 weeks. Going through around 1 pad per hour, worsened bleeding over last few days. Headache intermittent, light headed. No thinners but does have hx IDA.  Review of Systems  Positive: Vaginal bleeding Negative: syncope  Physical Exam  BP (!) 141/62   Pulse 95   Temp 98.9 F (37.2 C)   Resp 18   Ht 5\' 4"  (1.626 m)   Wt 111.4 kg   SpO2 100%   BMI 42.16 kg/m  Gen:   Awake, no distress   Resp:  Normal effort  MSK:   Moves extremities without difficulty  Other:  Abd not peritoneal, no pallor  Medical Decision Making  Medically screening exam initiated at 11:42 AM.  Appropriate orders placed.  [facility] was informed that the remainder of the evaluation will be completed by another provider, this initial triage assessment does not replace that evaluation, and the importance of remaining in the ED until their evaluation is complete.     Tanda Rockers A, DO 12/31/22 1142

## 2022-12-31 NOTE — ED Notes (Signed)
Went to find pt from waiting room but she is in [place]. Will be brought to rm 21 once completed for eval.

## 2022-12-31 NOTE — ED Notes (Addendum)
ED TO INPATIENT HANDOFF REPORT  ED Nurse Name and Phone #: Dahlia Client [phone]  S Name/Age/Gender Joanne Hampton 52 y.o. female Room/Bed: 021C/021C  Code Status   Code Status: Prior  Home/SNF/Other Home Patient oriented to: self, place, time, and situation Is this baseline? Yes   Triage Complete: Triage complete  Chief Complaint vaginal bleeding ha  Triage Note Pt c/o vaginal bleedingx2wks. Pt states soaking more than one pad an hour. Pt c/o dizziness and weakness. Pt c/o HAx1wk   Allergies No Known Allergies  Level of Care/Admitting Diagnosis ED Disposition     ED Disposition  Admit   Condition  --   Comment  The patient appears reasonably stabilized for admission considering the current resources, flow, and capabilities available in the ED at this time, and I doubt any other [facility] requiring further screening and/or treatment in the ED prior to admission is  present.          B Medical/Surgery History Past Medical History:  Diagnosis Date   Bronchitis    Menorrhagia    Past Surgical History:  Procedure Laterality Date   CESAREAN SECTION  1990   TUBAL LIGATION       A IV Location/Drains/Wounds Patient Lines/Drains/Airways Status     Active Line/Drains/Airways     Name Placement date Placement time Site Days   Peripheral IV 12/31/22 20 G Anterior;Left;Proximal Forearm 12/31/22  1437  Forearm  less than 1            Intake/Output Last 24 hours No intake or output data in the 24 hours ending 12/31/22 1504  Labs/Imaging Results for orders placed or performed during the [facility] encounter of 12/31/22 (from the past 48 hour(s))  hCG, serum, qualitative     Status: None   Collection Time: 12/31/22 11:04 AM  Result Value Ref Range   Preg, Serum NEGATIVE NEGATIVE    Comment: Performed at [facility], [address] [place], [place] [address]  Basic metabolic panel     Status: Abnormal   Collection Time: 12/31/22 11:04 AM  Result Value Ref  Range   Sodium 139 135 - 145 mmol/L   Potassium 4.0 3.5 - 5.1 mmol/L   Chloride 106 98 - 111 mmol/L   CO2 22 22 - 32 mmol/L   Glucose, Bld 132 (H) 70 - 99 mg/dL    Comment: Glucose reference range applies only to samples taken after fasting for at least 8 hours.   BUN 6 6 - 20 mg/dL   Creatinine, Ser 0.98 0.44 - 1.00 mg/dL   Calcium 8.7 (L) 8.9 - 10.3 mg/dL   GFR, Estimated >11 >91 mL/min    Comment: (NOTE) Calculated using the CKD-EPI Creatinine Equation (2021)    Anion gap 11 5 - 15    Comment: Performed at [facility], [address], [place], [place] [address]  CBC with Differential/Platelet     Status: Abnormal   Collection Time: 12/31/22 11:04 AM  Result Value Ref Range   WBC 7.3 4.0 - 10.5 K/uL   RBC 1.96 (L) 3.87 - 5.11 MIL/uL   Hemoglobin 4.4 (LL) 12.0 - 15.0 g/dL    Comment: REPEATED TO VERIFY THIS CRITICAL RESULT HAS VERIFIED AND BEEN CALLED TO A. BANKS, RN BY DAISY BLU ON 10 10 2024 AT 1219, AND HAS BEEN READ BACK.     HCT 16.3 (L) 36.0 - 46.0 %   MCV 83.2 80.0 - 100.0 fL   MCH 22.4 (L) 26.0 - 34.0  pg   MCHC 27.0 (L) 30.0 - 36.0 g/dL   RDW 40.9 (H) 81.1 - 91.4 %   Platelets 424 (H) 150 - 400 K/uL    Comment: REPEATED TO VERIFY   nRBC 1.8 (H) 0.0 - 0.2 %   Neutrophils Relative % 73 %   Neutro Abs 5.3 1.7 - 7.7 K/uL   Lymphocytes Relative 19 %   Lymphs Abs 1.4 0.7 - 4.0 K/uL   Monocytes Relative 7 %   Monocytes Absolute 0.5 0.1 - 1.0 K/uL   Eosinophils Relative 1 %   Eosinophils Absolute 0.1 0.0 - 0.5 K/uL   Basophils Relative 0 %   Basophils Absolute 0.0 0.0 - 0.1 K/uL   Immature Granulocytes 0 %   Abs Immature Granulocytes 0.02 0.00 - 0.07 K/uL    Comment: Performed at [facility], [address], [place], [place] [address]   No results found.  Pending Labs Unresulted Labs (From admission, onward)     Start     Ordered   12/31/22 1439  Prepare RBC (crossmatch)  (Blood Administration Adult)  Once,   R       Question Answer Comment  # of  Units 2 units   Transfusion Indications Hemoglobin < 7 gm/dL and symptomatic   Number of Units to Keep Ahead NO units ahead   If emergent release call blood bank Not emergent release      12/31/22 1438   12/31/22 1323  Type and screen [facility]  Once,   STAT       Comments: [facility]    12/31/22 1322   12/31/22 1321  Vitamin B12  (Anemia Panel (PNL))  Once,   URGENT        12/31/22 1320   12/31/22 1321  Folate  (Anemia Panel (PNL))  Once,   URGENT        12/31/22 1320   12/31/22 1321  Iron and TIBC  (Anemia Panel (PNL))  Once,   URGENT        12/31/22 1320   12/31/22 1321  Ferritin  (Anemia Panel (PNL))  Once,   URGENT        12/31/22 1320   12/31/22 1321  Reticulocytes  (Anemia Panel (PNL))  Once,   URGENT        12/31/22 1320   12/31/22 1321  Protime-INR  Once,   STAT        12/31/22 1320   12/31/22 1139  Urinalysis, Routine w reflex microscopic -Urine, Clean Catch  Once,   URGENT       Question:  Specimen Source  Answer:  Urine, Clean Catch   12/31/22 1138            Vitals/Pain Today's Vitals   12/31/22 1056 12/31/22 1057 12/31/22 1428 12/31/22 1443  BP:   (!) 154/64   Pulse:   91   Resp:   (!) 28   Temp:    99.1 F (37.3 C)  TempSrc:    Oral  SpO2:   100%   Weight:  111.4 kg    Height:  5\' 4"  (1.626 m)    PainSc: 8        Isolation Precautions No active isolations  Medications Medications  0.9 %  sodium chloride infusion (Manually program via Guardrails IV Fluids) (has no administration in time range)  conjugated estrogens (PREMARIN) injection 25 mg (has no administration in time range)  megestrol (MEGACE) tablet 120 mg (has  no administration in time range)  leuprolide (LUPRON) injection 7.5 mg (has no administration in time range)    Mobility walks     Focused Assessments     R Recommendations: See Admitting Provider Note  Report given to:   Additional Notes: Blood transfusion ordered (labs pending);  OB/GYN saw pt in ED and will f/u with her in the AM

## 2022-12-31 NOTE — H&P (Addendum)
Hospital Admission History and Physical Service Pager: [phone]  Patient name: Joanne [facility] Medical record number: [id number] Date of Birth: 1970-09-28 Age: 52 y.o. Gender: female  Primary Care Provider: Bess Kinds, MD Consultants: Obstetrics and Gynecology  Code Status: Full Code  Preferred Emergency Contact:  Gustavus Bryant (Sister) [phone] [facility] Phone)   Daughter: Kelie Gainey Phone: [phone]  Chief Complaint: Menorrhagia   Assessment and Plan: Vernita Tague is a 52 y.o. female presenting with with a past medical history of menorrhagia, iron deficiency anemia secondary to chronic blood loss, and asthma presenting with menorrhagia and symptomatic anemia.   Differential for this patient's presentation of menorrhagia includes uterine leiomyoma/adenomyosis. Highest suspicion for uterine etiology, specifically uterine fibroids vs adenomyosis, given patient's history of bleeding with large clots after missed cycle, increased cramping, and presence of two fibroids on TVUS.  Consideration for uterine malignancy given age and heavy bleeding which has been ongoing for 2 years, and TVUS with endometrial thickness of 10.59mm. Less likely bleeding or clotting disorder given no family or personal history of frequent/recurrent bleeds, no symptoms consistent with above including no easy bruising/bleeding, petechiae, purpura, and patient's age inconsistent with initial presentation for bleeding disorder. Less suspicious for hemolysis, GI bleed, or urinary source given otherwise more suggestive presentation of uterine fibroids/adenomyosis. Assessment & Plan Anemia due to acute blood loss Hgb on presentation of 4.4 with symptoms of dizziness, headache, SOB. No signs/symptoms consistent with alternative source of bleeding. Exam with no bruising/lesions, intact neurologically, cardiopulmonary exam unremarkable. TVUS with leiomyomas increased in size from prior and  thickened endometrium. Requires admission for management of acute bleeding. - OB/GYN consulted, appreciate recommendations - IV Premarin, Megestrol, and Lupron 7.5 mg IV administered for acute bleeding - Outpatient endometrial biopsy and likely definitive surgical management of enlarged fibroid uterus - Transfuse per protocol with 2u pRBC - Post transfusion H/H - Transfusion threshold < 7 - Vitamin B12, Folate - Iron Studies/TIBC, Ferritin - LH, FSH, Prolactin, DHEA-S levels  Thrombocytosis PLT elevated to 424 on admission. Upon review, patient with history of reactive thrombocytosis in the setting of IDA with PLT of 527 in 2022 and 429 in 10/2020. Previously with resolution following resolution of anemia. Suspect reactive thrombocytosis in the setting of acute anemia. Anticipate normalization in platelet count as this resolves. Patient with no symptoms of infection.  - CBC with diff in AM to monitor platelets  Hypertension Hypertensive on presentation to 141/62. Patient asymptomatic from this. BP continues to be elevated in 140s to 150s systolic. Patient otherwise hemodynamically stable. Suspect this is in the setting of acute stressors though does have documented history of HTN; will defer intervention at this time.  - Continue to monitor vitals   Chronic and Stable Conditions: Asthma: Home albuterol PRN   FEN/GI: Regular diet VTE Prophylaxis: SCDs due to active bleeding  Disposition: Home pending clinical improvement  History of Present Illness:  Joanne [facility] is a 52 y.o. female with a past medical history of menorrhagia, iron deficiency anemia secondary to chronic blood loss, and asthma presenting with menorrhagia and symptomatic anemia.  Patient reports 2 weeks ago she began her normal menstrual cycle, but it has been continuing for past 2 weeks, much heavier than usual. Has been passing large blood clots, golf ball sized. Changes pads every 1 hour. Previously no cramping on  regular cycles, but this cycle with persistent pelvic cramping. She previously had normal cycles her entire life, but for the past 2 years has had intermittently heavy periods. No  acute changes in past month prior to current cycle/symptoms. Patient is not menopausal, still gets regular cycles.   Has been feeling generalized lightheadedness and weakness for the past 2 weeks since this bleeding started. No syncopal episodes, no chest pain, shortness of breath. Does also endorse migraines which are chronic for her, but no HA at this time. Also feels some L arm discomfort, but no weakness or sensory changes.   No hemoptysis. Does not notice any blood in her stool, but reports it is somewhat hard to tell as she is having heavy vaginal bleeding and notes blood in toilet. No history of GI bleeding. No hematuria, dysuria, abdominal pain.   No family history or personal history of bleeding problems. Has had a C-section and tubal ligation, but no excessive bleeding with these procedures. No easy bleeding/bruising, no rashes or lesions noted. Not on any blood thinners, takes ASA for pain occasionally. No known thyroid, liver, or kidney issues.   History of symptomatic iron deficiency anemia that is followed by Hematology/Oncology since 2022. Prior hospitalization for symptomatic anemia in 2022 where she received IV Iron. Per chart review, patient's workup including iron studies, coagulation/bleeding time labs remarkable only for iron deficiency anemia due to blood loss. Patient refused IV iron infusions, only on oral iron at home. No prior colonoscopy.   In the ED, patient arrived afebrile, hypertensive to 141/62, HR 92, RR 18, satting well on RA. CBC with Hgb of 4.4 (prior of 7.9 in 06/2022), HCT of 16.3, MCV of 83.2. No leukocytosis. BMP largely unremarkable, glucose of 132, but intact renal function and electrolytes. hcG negative. Order for 2u pRBC placed with post transfusion H/H. Received Megace 120 mg x 1 and  Lupron 7.5 mg x 1 and IV Premarin for acute bleeding. OB consulted by ED.   Review Of Systems: Per HPI.   Pertinent Past Medical History: Past Medical History:  Diagnosis Date   Bronchitis    Menorrhagia    Remainder reviewed in history tab.   Pertinent Past Surgical History: Past Surgical History:  Procedure Laterality Date   CESAREAN SECTION  1990   TUBAL LIGATION      Remainder reviewed in history tab.   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 Remainder reviewed in history tab.   Important Outpatient Medications: No current facility-administered medications on file prior to encounter.   Current Outpatient Medications on File Prior to Encounter  Medication Sig Dispense Refill   albuterol (VENTOLIN HFA) 108 (90 Base) MCG/ACT inhaler INHALE 2 PUFFS INTO THE LUNGS EVERY 6 HOURS AS NEEDED FOR WHEEZING OR SHORTNESS OF BREATH 6.7 g 0   ascorbic acid (VITAMIN C) 500 MG tablet Take 1 tablet (500 mg total) by mouth daily. 90 tablet 1   ferrous sulfate 325 (65 FE) MG tablet Take 1 tablet (325 mg total) by mouth 2 (two) times daily with a meal. 180 tablet 1   Multiple Vitamin (MULTIVITAMIN) tablet  Take 1 tablet by mouth daily.     Remainder reviewed in medication history.   Objective: BP (!) 154/64   Pulse 91   Temp 98.9 F (37.2 C)   Resp (!) 28   Ht 5\' 4"  (1.626 m)   Wt 111.4 kg   SpO2 100%   BMI 42.16 kg/m  Exam: General: Alert and oriented in no acute distress.   Eyes: PERRL. EOMI Cardiovascular: 1/6 systolic flow murmur, rate regular. No other murmurs, rubs, gallops.  Respiratory: Lungs clear to auscultation bilaterally, with no focal wheezing  Gastrointestinal: No abdominal tenderness to palpation, no rebound, guarding, or distension.  Extremities: Warm and well perfused with no lower extremity edema.  Derm: No rashes, bruising, or lesions noted. No petechiae or purpura.  Neuro: Alert and oriented, no focal neurologic deficits, intact strength and sensation in BUE and BLE.  GU: No abrasions, lesions, wounds noted on external vaginal exam. Pad with moderate amount of blood, no clots expressed on fundal massage. Dr. Phineas Real present during exam.  Labs:  CBC BMET  Recent Labs  Lab 12/31/22 1104  WBC 7.3  HGB 4.4*  HCT 16.3*  PLT 424*   Recent Labs  Lab 12/31/22 1104  NA 139  K 4.0  CL 106  CO2 22  BUN 6  CREATININE 0.87  GLUCOSE 132*  CALCIUM 8.7*     Pertinent additional labs: hcG: Negative Pending Labs: UA, Vitamin B12, Folate, Iron/TIBC, Ferritin, Reticulocytes, PT-INR  Imaging Studies Performed: Transvaginal US:  IMPRESSION: 1. Fibroid uterus with 2 intrauterine fibroids largest measuring 4.4 x 4.5 x 4.6 cm.  2. There is a 3.2 cm anechoic non aggressive structure in the left ovary, which may represent follicular cyst vs senescent ovarian cyst.  3. Nonvisualization of the right ovary. No right adnexal mass seen.  [place], Medical Student 12/31/2022, 2:33 PM  I was personally present and performed or re-performed the history, physical exam and medical decision making activities of this service and have verified that the service and findings are accurately documented in the student's note.  Janeal Holmes, MD                  12/31/2022, 5:21 PM PGY-2, [facility] FPTS Intern pager: [phone], text pages welcome Secure chat group [facility] Teaching Service

## 2022-12-31 NOTE — ED Notes (Signed)
Blood consent signed and scanned into chart.

## 2022-12-31 NOTE — Progress Notes (Signed)
FMTS Brief Progress Note  S: Rounded with Dr. Valentino Nose.  Patient seen sitting up in bed.  She states she is doing well.  Reports some dizziness.  Denies headache, shortness of breath.  She is careful when she stands up and walks around her room, but is able to ambulate to the bathroom on her own.  No other concerns at this time.   O: BP (!) 154/69 (BP Location: Right Arm)   Pulse 94   Temp 98.6 F (37 C) (Oral)   Resp 16   Ht 5\' 4"  (1.626 m)   Wt 111.4 kg   SpO2 100%   BMI 42.16 kg/m   General: Well-appearing, pleasant, no acute distress. Cardio: Regular rate, regular rhythm, no murmurs on exam.  2+ radial pulses intact. Pulm: Clear, no wheezing, no crackles. No increased work of breathing.  On room air. Abdominal: bowel sounds present, soft Neuro: Alert and oriented, speech normal in content. Psych:  Cognition and judgment appear intact. Alert, communicative, and cooperative.   A/P: Patient well-appearing after transfusion of 2U PRBCs. - Orders reviewed. Labs for AM ordered, which was adjusted as needed.  - If condition changes, plan includes reevaluation.   Cyndia Skeeters, DO 12/31/2022, 8:44 PM PGY-1, [facility] Night Resident  Please page [phone] with questions.

## 2022-12-31 NOTE — ED Notes (Signed)
Admitting team at bedside assessing pt and performing a pelvic exam.

## 2022-12-31 NOTE — Hospital Course (Addendum)
Joanne Hampton is a 52 y.o. female who was admitted to the [facility] Medicine Teaching Service at [facility] for symptomatic IDA. Hospital course is outlined below by problem.   Symptomatic iron deficiency anemia Presented with dizziness and weakness in the context of heavy vaginal bleeding.  Mildly hypertensive, otherwise HDS.  Hemoglobin 4.4.  She was given 3 units PRBCs in total.  By discharge, Hgb improved to 7.8.  OB/GYN consulted who gave Megace, Lupron, IV estrogen for bleeding.  They recommended outpatient endometrial biopsy and likely definitive management with hysterectomy.  Pt was discharged on Megesterol 120 mg daily and scheduled for OGBYN follow up.  Thrombocytosis Platelets elevated to 424 and admission.  No signs of infection on exam.  Likely reactive due to increased bone marrow response with severe anemia. Improved to 399,000 by the date of discharge.  Hypertension Presented mildly hypertensive to 154/64.  No medications at home.  Remained elevated throughout admission. Consider starting BP medications outpatient.  Other conditions that were chronic and stable: Asthma (continue as needed albuterol)  Issues for follow up: Recommend recheck CBC Given 14 day supply of Megesterol before discharge, refill for another 1 month supply. TOC did not have enough supply to fill for 30 days. Assess blood pressure control Consider IV iron infusion in 1 week Ensure OBGYN follow up for outpatient endometrial biopsy

## 2022-12-31 NOTE — ED Notes (Signed)
OBGYN at bedside

## 2022-12-31 NOTE — ED Provider Notes (Signed)
EMERGENCY DEPARTMENT AT [facility] Provider Note   CSN: [id number] Arrival date & time: 12/31/22  4098     History  Chief Complaint  Patient presents with   Headache   Vaginal Bleeding    Joanne Hampton is a 52 y.o. female.  52 y.o. female presenting for lightheadedness and heavy vaginal bleeding ongoing to 2 weeks. Patient reports that she has been having difficulties with heavy vaginal bleeding for over 2 years.  She has been hospitalized requiring transfusions in the past for anemia 2/2 to vaginal bleeding.  She reports her vaginal bleeding had been better controlled until 2 weeks ago when she started bleeding and has not stopped.  She has been filling a pad every hour for the last several days.  She reports passing several golf ball size clots.  She denies any severe abdominal pain.  She has been having regular bowel movements and denies having hematochezia.  She is not currently on any blood thinners or hormonal birth control and only takes iron supplementation.  She denies family history of GYN cancer.  The history is provided by the patient. No language interpreter was used.  Headache Associated symptoms: no abdominal pain, no diarrhea, no nausea and no vomiting   Vaginal Bleeding Associated symptoms: no abdominal pain, no dysuria and no nausea        Home Medications Prior to Admission medications   Medication Sig Start Date End Date Taking? Authorizing Provider  albuterol (VENTOLIN HFA) 108 (90 Base) MCG/ACT inhaler INHALE 2 PUFFS INTO THE LUNGS EVERY 6 HOURS AS NEEDED FOR WHEEZING OR SHORTNESS OF BREATH 06/29/21  Yes Autry-Lott, Randa Evens, DO  ascorbic acid (VITAMIN C) 500 MG tablet Take 1 tablet (500 mg total) by mouth daily. 12/10/21  Yes Rickard Patience, MD  ferrous sulfate 325 (65 FE) MG tablet Take 1 tablet (325 mg total) by mouth 2 (two) times daily with a meal. 12/10/21  Yes Rickard Patience, MD  Multiple Vitamin (MULTIVITAMIN) tablet Take 1 tablet by mouth  daily.   Yes [provider]      Allergies    Patient has no known allergies.    Review of Systems   Review of Systems  Gastrointestinal:  Negative for abdominal distention, abdominal pain, blood in stool, diarrhea, nausea and vomiting.  Genitourinary:  Positive for vaginal bleeding. Negative for difficulty urinating, dysuria, flank pain, hematuria and pelvic pain.  Neurological:  Positive for light-headedness and headaches. Negative for syncope.    Physical Exam Updated Vital Signs BP (!) 154/64   Pulse 91   Temp 99.1 F (37.3 C) (Oral)   Resp (!) 28   Ht 5\' 4"  (1.626 m)   Wt 111.4 kg   SpO2 100%   BMI 42.16 kg/m  Physical Exam Constitutional:      General: She is not in acute distress.    Appearance: She is well-developed. She is obese. She is not ill-appearing.  HENT:     Head: Normocephalic.  Cardiovascular:     Rate and Rhythm: Normal rate and regular rhythm.     Heart sounds: Normal heart sounds. No murmur heard. Pulmonary:     Effort: Pulmonary effort is normal.     Breath sounds: Normal breath sounds.  Abdominal:     General: Bowel sounds are normal. There is no distension.     Palpations: Abdomen is soft.     Tenderness: There is no abdominal tenderness.  Skin:    General: Skin is warm.  Capillary Refill: Capillary refill takes 2 to 3 seconds.  Neurological:     Mental Status: She is alert and oriented to person, place, and time. Mental status is at baseline.     GCS: GCS eye subscore is 4. GCS verbal subscore is 5. GCS motor subscore is 6.  Psychiatric:        Mood and Affect: Mood normal.        Speech: Speech normal.        Behavior: Behavior normal.     ED Results / Procedures / Treatments   Labs (all labs ordered are listed, but only abnormal results are displayed) Labs Reviewed  BASIC METABOLIC PANEL - Abnormal; Notable for the following components:      Result Value   Glucose, Bld 132 (*)    Calcium 8.7 (*)    All other  components within normal limits  CBC WITH DIFFERENTIAL/PLATELET - Abnormal; Notable for the following components:   RBC 1.96 (*)    Hemoglobin 4.4 (*)    HCT 16.3 (*)    MCH 22.4 (*)    MCHC 27.0 (*)    RDW 19.3 (*)    Platelets 424 (*)    nRBC 1.8 (*)    All other components within normal limits  HCG, SERUM, QUALITATIVE  URINALYSIS, ROUTINE W REFLEX MICROSCOPIC  VITAMIN B12  FOLATE  IRON AND TIBC  FERRITIN  RETICULOCYTES  PROTIME-INR  TYPE AND SCREEN  PREPARE RBC (CROSSMATCH)    EKG None  Radiology No results found.  Procedures Procedures    Medications Ordered in ED Medications  0.9 %  sodium chloride infusion (Manually program via Guardrails IV Fluids) (has no administration in time range)  conjugated estrogens (PREMARIN) injection 25 mg (has no administration in time range)  megestrol (MEGACE) tablet 120 mg (has no administration in time range)  leuprolide (LUPRON) injection 7.5 mg (has no administration in time range)    ED Course/ Medical Decision Making/ A&P                                 Medical Decision Making 52 y.o. female presenting with lightheadedness and vaginal bleeding.  On presentation vital signs significant for elevated blood pressure to 154/64.  On physical exam patient is well-appearing with no signs of acute distress.  Lab significant for hemoglobin of 4.4.  Differential for this presentation includes vaginal bleeding secondary to uterine fibroids, adenomyosis, endometrial cancer.    Spoke with on-call GYN Dr. Despina Hidden who recommended admission for further workup and management.  Patient is currently clinically stable but with hemoglobin 4.4 and continued vaginal bleeding warrants admission for monitoring.  As this is a family medicine patient, FMTS asked to admit with GYN consult.  Ordered 2 units RBCs, vitamin B12, folate, iron, TIBC, ferritin, reticulocyte, INR/PT which were pending at time of admission.  Amount and/or Complexity of Data  Reviewed Labs: ordered. Decision-making details documented in ED Course.          Final Clinical Impression(s) / ED Diagnoses Final diagnoses:  Abnormal vaginal bleeding  Iron deficiency anemia due to chronic blood loss    Rx / DC Orders ED Discharge Orders     None         Glendale Chard, DO 12/31/22 1452    Maia Plan, MD 01/01/23 [phone]

## 2022-12-31 NOTE — Consult Note (Signed)
Reason for Consult:menometrorrhagia + anemia, symptomatic Referring: Family Medicine   Joanne Hampton is an 52 y.o. female. [id number] s/p BTL with long standing menometrorrhagia Bleeding for 2 consecutive weeks, changing large pads every hour Hemoglobin 4.4 Sonogram 2 years ago with 582 cc uterus with fibroid noted, endometrial polyp, today's is pending Also with long standing anemia, seeing heme on which I think is related to her HMB No management of her bleeding that I can find in her record Last saw OB GYN 2 years ago        Past Medical History:  Diagnosis Date   Bronchitis    Menorrhagia     Past Surgical History:  Procedure Laterality Date   CESAREAN SECTION  1990   TUBAL LIGATION      Family History  Problem Relation Age of Onset   Heart failure Mother     Social History:  reports that she has never smoked. She has never used smokeless tobacco. She reports current alcohol use. She reports that she does not use drugs.  Allergies: No Known Allergies  Medications: I have reviewed the patient's current medications.    Blood pressure (!) 154/64, pulse 91, temperature 99.1 F (37.3 C), temperature source Oral, resp. rate (!) 28, height 5\' 4"  (1.626 m), weight 111.4 kg, SpO2 100%.   Results for orders placed or performed during the hospital encounter of 12/31/22 (from the past 48 hour(s))  hCG, serum, qualitative     Status: None   Collection Time: 12/31/22 11:04 AM  Result Value Ref Range   Preg, Serum NEGATIVE NEGATIVE    Comment: Performed at [facility], [address], [place], [place] [address]  Basic metabolic panel     Status: Abnormal   Collection Time: 12/31/22 11:04 AM  Result Value Ref Range   Sodium 139 135 - 145 mmol/L   Potassium 4.0 3.5 - 5.1 mmol/L   Chloride 106 98 - 111 mmol/L   CO2 22 22 - 32 mmol/L   Glucose, Bld 132 (H) 70 - 99 mg/dL    Comment: Glucose reference range applies only to samples taken after fasting for at least 8  hours.   BUN 6 6 - 20 mg/dL   Creatinine, Ser 8.11 0.44 - 1.00 mg/dL   Calcium 8.7 (L) 8.9 - 10.3 mg/dL   GFR, Estimated >91 >47 mL/min    Comment: (NOTE) Calculated using the CKD-EPI Creatinine Equation (2021)    Anion gap 11 5 - 15    Comment: Performed at [facility], [address], [place], [place] [address]  CBC with Differential/Platelet     Status: Abnormal   Collection Time: 12/31/22 11:04 AM  Result Value Ref Range   WBC 7.3 4.0 - 10.5 K/uL   RBC 1.96 (L) 3.87 - 5.11 MIL/uL   Hemoglobin 4.4 (LL) 12.0 - 15.0 g/dL    Comment: REPEATED TO VERIFY THIS CRITICAL RESULT HAS VERIFIED AND BEEN CALLED TO A. BANKS, RN BY DAISY BLU ON 10 10 2024 AT 1219, AND HAS BEEN READ BACK.     HCT 16.3 (L) 36.0 - 46.0 %   MCV 83.2 80.0 - 100.0 fL   MCH 22.4 (L) 26.0 - 34.0 pg   MCHC 27.0 (L) 30.0 - 36.0 g/dL   RDW 21.3 (H) 08.6 - 57.8 %   Platelets 424 (H) 150 - 400 K/uL    Comment: REPEATED TO VERIFY   nRBC 1.8 (H) 0.0 - 0.2 %   Neutrophils Relative %  73 %   Neutro Abs 5.3 1.7 - 7.7 K/uL   Lymphocytes Relative 19 %   Lymphs Abs 1.4 0.7 - 4.0 K/uL   Monocytes Relative 7 %   Monocytes Absolute 0.5 0.1 - 1.0 K/uL   Eosinophils Relative 1 %   Eosinophils Absolute 0.1 0.0 - 0.5 K/uL   Basophils Relative 0 %   Basophils Absolute 0.0 0.0 - 0.1 K/uL   Immature Granulocytes 0 %   Abs Immature Granulocytes 0.02 0.00 - 0.07 K/uL    Comment: Performed at [facility], [address], [place], [place] [address]    Pelvic sonogram pending  Assessment/Plan: Menometrorrhagia with anemia, both long standing problems  Ordered: >IV premarin for immediate improvement in bleeding >Megestrol high dose for intermediate improvement >Lupron 7.5 mg IV for management over the next 2 months  Recommend: 3-4 units PRBC given anemia and likely equilibration lower Needs endometrial biopsy and definitive surgical management of this enlarged fibroid uterus with severe, acute on chronic associated  anemia I will follow and schedule her outpatient follow up  Lazaro Arms 12/31/2022

## 2022-12-31 NOTE — ED Triage Notes (Signed)
Pt c/o vaginal bleedingx2wks. Pt states soaking more than one pad an hour. Pt c/o dizziness and weakness. Pt c/o HAx1wk

## 2022-12-31 NOTE — Assessment & Plan Note (Addendum)
Hgb on presentation of 4.4 with symptoms of dizziness, headache, SOB. No signs/symptoms consistent with alternative source of bleeding. Exam with no bruising/lesions, intact neurologically, cardiopulmonary exam unremarkable. TVUS with leiomyomas increased in size from prior and thickened endometrium. Requires admission for management of acute bleeding. - OB/GYN consulted, appreciate recommendations - IV Premarin, Megestrol, and Lupron 7.5 mg IV administered for acute bleeding - Outpatient endometrial biopsy and likely definitive surgical management of enlarged fibroid uterus - Transfuse per protocol with 2u pRBC - Post transfusion H/H - Transfusion threshold < 7 - Vitamin B12, Folate - Iron Studies/TIBC, Ferritin - LH, FSH, Prolactin, DHEA-S levels

## 2022-12-31 NOTE — ED Notes (Signed)
Pt to rm 21. Provider at bedside. Placed on cardiac monitor.

## 2023-01-01 ENCOUNTER — Other Ambulatory Visit (HOSPITAL_COMMUNITY): Payer: Self-pay

## 2023-01-01 DIAGNOSIS — D75839 Thrombocytosis, unspecified: Secondary | ICD-10-CM | POA: Diagnosis not present

## 2023-01-01 DIAGNOSIS — D500 Iron deficiency anemia secondary to blood loss (chronic): Secondary | ICD-10-CM | POA: Diagnosis not present

## 2023-01-01 DIAGNOSIS — N939 Abnormal uterine and vaginal bleeding, unspecified: Secondary | ICD-10-CM | POA: Diagnosis not present

## 2023-01-01 DIAGNOSIS — I10 Essential (primary) hypertension: Secondary | ICD-10-CM | POA: Diagnosis not present

## 2023-01-01 LAB — CBC
HCT: 21.6 % — ABNORMAL LOW (ref 36.0–46.0)
Hemoglobin: 6.7 g/dL — CL (ref 12.0–15.0)
MCH: 26.2 pg (ref 26.0–34.0)
MCHC: 31 g/dL (ref 30.0–36.0)
MCV: 84.4 fL (ref 80.0–100.0)
Platelets: 399 10*3/uL (ref 150–400)
RBC: 2.56 MIL/uL — ABNORMAL LOW (ref 3.87–5.11)
RDW: 18.2 % — ABNORMAL HIGH (ref 11.5–15.5)
WBC: 11.2 10*3/uL — ABNORMAL HIGH (ref 4.0–10.5)
nRBC: 0.5 % — ABNORMAL HIGH (ref 0.0–0.2)

## 2023-01-01 LAB — PREPARE RBC (CROSSMATCH)

## 2023-01-01 LAB — HEMOGLOBIN AND HEMATOCRIT, BLOOD
HCT: 25 % — ABNORMAL LOW (ref 36.0–46.0)
Hemoglobin: 7.8 g/dL — ABNORMAL LOW (ref 12.0–15.0)

## 2023-01-01 LAB — TSH: TSH: 2.269 u[IU]/mL (ref 0.350–4.500)

## 2023-01-01 LAB — HIV ANTIBODY (ROUTINE TESTING W REFLEX): HIV Screen 4th Generation wRfx: NONREACTIVE

## 2023-01-01 MED ORDER — SODIUM CHLORIDE 0.9% IV SOLUTION: Freq: Once | INTRAVENOUS | Status: AC

## 2023-01-01 MED ORDER — ESTROGENS CONJUGATED 25 MG IJ SOLR
25.0000 mg | Freq: Four times a day (QID) | INTRAMUSCULAR | Status: DC
  Filled 2023-01-01: qty 25

## 2023-01-01 MED ORDER — ESTROGENS CONJUGATED 25 MG IJ SOLR
25.0000 mg | Freq: Four times a day (QID) | INTRAMUSCULAR | Status: DC
  Administered 2023-01-01: 25 mg via INTRAVENOUS
  Filled 2023-01-01 (×2): qty 25

## 2023-01-01 MED ORDER — MEGESTROL ACETATE 40 MG PO TABS
120.0000 mg | ORAL_TABLET | Freq: Four times a day (QID) | ORAL | 0 refills | Status: AC
  Filled 2023-01-01: qty 114, 9d supply, fill #0

## 2023-01-01 MED ORDER — LEUPROLIDE ACETATE 7.5 MG IM KIT
7.5000 mg | PACK | Freq: Once | INTRAMUSCULAR | 0 refills | Status: DC
  Filled 2023-01-01: qty 1, 1d supply, fill #0

## 2023-01-01 MED ORDER — CYANOCOBALAMIN 1000 MCG/ML IJ SOLN
1000.0000 ug | Freq: Once | INTRAMUSCULAR | Status: AC
  Administered 2023-01-01: 1000 ug via INTRAMUSCULAR
  Filled 2023-01-01: qty 1

## 2023-01-01 NOTE — Assessment & Plan Note (Addendum)
Hgb improved to 6.7 from 4.4 on admission. TVUS with leiomyomas increased in size from prior and thickened endometrium. Iron studies consistent with severe IDA, B12 low normal, folate WNL. - transfuse 1 u PRBCs for threshold <7.0 - post tx H&H - OB/GYN consulted, appreciate recommendations  - will get Lupron 7.5mg  IV today - IV Premarin q6 x 2 more doses ordered by OB - Megestrol 120mg  daily - Outpatient endometrial biopsy and likely definitive surgical management of enlarged fibroid uterus - Vitamin B12 supplementation IM - f/u TSH, HIV labs - f/u LH, FSH, Prolactin, DHEA-S levels

## 2023-01-01 NOTE — Discharge Instructions (Signed)
Dear Joanne Hampton,   Thank you for letting us participate in your care! You presented with vaginal bleeding and low hemoglobin (Anemia) requiring 3 blood transfusions. You were given intravenous medications to help control your bleeding.   POST-HOSPITAL & CARE INSTRUCTIONS OBGYN plans to see you in their clinic for further workup. Please let PCP/Specialists know of any changes in medications that were made.  Please see medications section of this packet for any medication changes.   DOCTOR'S APPOINTMENTS & FOLLOW UP Future Appointments  Date Time Provider Department Center  01/05/2023 11:30 AM Lazaro Arms, MD [facility]  01/05/2023  4:10 PM Bess Kinds, MD [facility]     Thank you for choosing [facility]! Take care and be well!  Family Medicine Teaching Service Inpatient Team [place]  [facility]  [address] [place], [place] [address] [phone]

## 2023-01-01 NOTE — Progress Notes (Signed)
Daily Progress Note Intern Pager: [phone]  Patient name: Joanne Hampton Medical record number: [id number] Date of birth: 1970-04-17 Age: 52 y.o. Gender: female  Primary Care Provider: Bess Kinds, MD Consultants: OB/GYN Code Status: Full  Pt Overview and Major Events to Date:  10/10: admitted, tx 2u pRBCs 10/11: 1u pRBCs  Assessment and Plan: Joanne Hampton is a 52 y.o. female presenting with menorrhagia and symptomatic anemia to 4.4 likely secondary to bleeding fibroids. Hgb stable after 2 units of pRBCs. OB/GYN on board and treating with premarin, megestrol, and lupron. Will likely need a hysterectomy outpatient.  PMH: menorrhagia, IDA, asthma Assessment & Plan Anemia due to acute blood loss Hgb improved to 6.7 from 4.4 on admission. TVUS with leiomyomas increased in size from prior and thickened endometrium. Iron studies consistent with severe IDA, B12 low normal, folate WNL. - transfuse 1 u PRBCs for threshold <7.0 - post tx H&H - OB/GYN consulted, appreciate recommendations  - will get Lupron 7.5mg  IV today - IV Premarin q6 x 2 more doses ordered by OB - Megestrol 120mg  daily - Outpatient endometrial biopsy and likely definitive surgical management of enlarged fibroid uterus - Vitamin B12 supplementation IM - f/u TSH, HIV labs - f/u LH, FSH, Prolactin, DHEA-S levels  Hypertension Pressures 140s-150s systolic since admission. Asymptomatic. Suspect this is in the setting of acute stressors though does have documented history of HTN; will defer intervention at this time.  - Continue to monitor vitals  - consider starting antihypertensive medication once anemia improved Thrombocytosis (Resolved: 01/01/2023) PLT elevated to 424 on admission, improved to 399. Likely 2/2 reactive thrombocytosis due to severe anemia.   Chronic and Stable Issues: Asthma: home albuterol PRN  FEN/GI: regular PPx: SCDs in setting of active bleed Dispo: pending  clinical improvement  Subjective:  Patient states she is feeling okay this morning.  States she still has some dizziness when she stands up but is trying to take it slow.  States she passed a clot this morning, states it was about half the size of her hand.  Denies headache, shortness of breath.  Gave consent for another unit of pRBCs.  Objective: Temp:  [98.3 F (36.8 C)-99.1 F (37.3 C)] 98.8 F (37.1 C) (10/11 0404) Pulse Rate:  [80-95] 80 (10/11 0404) Resp:  [16-28] 17 (10/11 0150) BP: (136-154)/(50-80) 144/74 (10/11 0404) SpO2:  [100 %] 100 % (10/11 0404) Weight:  [111.4 kg] 111.4 kg (10/10 1057) Physical Exam: General: Middle-age female sitting up in bed in no acute distress, pleasant, conversant Cardiovascular: RRR, normal S1/S2 Respiratory: CTAB, normal work of breathing on room air, no wheezes, rales, rhonchi Abdomen: Diminished bowel sounds, mildly tender to palpation of lower abdomen, soft, nondistended Extremities: No edema to bilateral lower extremities  Laboratory: Most recent CBC Lab Results  Component Value Date   WBC 7.3 12/31/2022   HGB 4.4 (LL) 12/31/2022   HCT 16.3 (L) 12/31/2022   MCV 83.2 12/31/2022   PLT 424 (H) 12/31/2022   Most recent BMP    Latest Ref Rng & Units 12/31/2022   11:04 AM  BMP  Glucose 70 - 99 mg/dL 130   BUN 6 - 20 mg/dL 6   Creatinine 8.65 - 7.84 mg/dL 6.96   Sodium 295 - 284 mmol/L 139   Potassium 3.5 - 5.1 mmol/L 4.0   Chloride 98 - 111 mmol/L 106   CO2 22 - 32 mmol/L 22   Calcium 8.9 - 10.3 mg/dL 8.7     Other pertinent  labs B12: 184 Folate: 11.0 Iron/TIBC/Ferritin: 6/ 542/ 2 Reticulocytes: 2.7% PT-INR: 13.6/ 1.0   Imaging/Diagnostic Tests: None  Lorayne Bender, MD 01/01/2023, 7:19 AM  PGY-1, [place] [facility] FPTS Intern pager: [phone], text pages welcome Secure chat group [facility] Teaching Service

## 2023-01-01 NOTE — Assessment & Plan Note (Signed)
Pressures 140s-150s systolic since admission. Asymptomatic. Suspect this is in the setting of acute stressors though does have documented history of HTN; will defer intervention at this time.  - Continue to monitor vitals  - consider starting antihypertensive medication once anemia improved

## 2023-01-01 NOTE — Final Consult Note (Signed)
Follow up  for results:   Chief Complaint  Patient presents with   Headache   Vaginal Bleeding   Bleeding is minimal now Sonogram stable over past 2+ years  Continue megestrol 120 mg daily as outpatient Lupron 7.5 mg to be given prior to dischage   3rd unit hanging   Agree with discharge after unit is in and lupron given Office staff is setting up appt next week for endometrial biopsy  Lazaro Arms, MD 01/01/2023 11:57 AM  Please call [phone] [facility] OB/GYN Consult Attending Monday-Friday 8am - 5pm) or [phone] [facility] OB/GYN Attending On Call all day, every day) for any  further gynecologic concerns at any time.  Thank you for involving [place] in the care of this patient.  Blood pressure (!) 141/73, pulse 88, temperature 98.4 F (36.9 C), resp. rate 18, height 5\' 4"  (1.626 m), weight 111.4 kg, SpO2 100%.  US Pelvis Complete  Result Date: 12/31/2022 CLINICAL DATA:  vaginal bleeding. EXAM: TRANSABDOMINAL AND TRANSVAGINAL ULTRASOUND OF PELVIS TECHNIQUE: Both transabdominal and transvaginal ultrasound examinations of the pelvis were performed. Transabdominal technique was performed for global imaging of the pelvis including uterus, ovaries, adnexal regions, and pelvic cul-de-sac. It was necessary to proceed with endovaginal exam following the transabdominal exam to visualize the uterus. COMPARISON:  Pelvic ultrasound from 06/28/2020. FINDINGS: The technologist noted limited transvaginal exam. Uterus Measurements: 7.7 x 10.5 x 14.2 cm = volume: 600.1 mL. There are at least 2 intramural leiomyomas with largest measuring up to 4.4 x 4.5 x 4.6 cm. Endometrium Thickness: 10.7 mm.  No focal abnormality visualized. Right ovary Right ovary was not distinctly visualized. No right adnexal mass seen. Left ovary Measurements: 3.3 x 3.9 x 4.2 cm = volume: 27.7 mL. There is a 2.2 x 2.5 x 3.2 cm anechoic structure in the left ovary, which may represent a follicular cyst versus senescent ovarian cyst  depending on patient's menstruation status. No mural nodularity or internal vascularity seen. Other findings No abnormal free fluid. IMPRESSION: 1. Fibroid uterus. 2. There is a 3.2 cm anechoic non aggressive structure in the left ovary, as discussed above. 3. Nonvisualization of the right ovary. No right adnexal mass seen. Electronically Signed   By: Jules Schick M.D.   On: 12/31/2022 15:45   US Transvaginal Non-OB  Result Date: 12/31/2022 CLINICAL DATA:  vaginal bleeding. EXAM: TRANSABDOMINAL AND TRANSVAGINAL ULTRASOUND OF PELVIS TECHNIQUE: Both transabdominal and transvaginal ultrasound examinations of the pelvis were performed. Transabdominal technique was performed for global imaging of the pelvis including uterus, ovaries, adnexal regions, and pelvic cul-de-sac. It was necessary to proceed with endovaginal exam following the transabdominal exam to visualize the uterus. COMPARISON:  Pelvic ultrasound from 06/28/2020. FINDINGS: The technologist noted limited transvaginal exam. Uterus Measurements: 7.7 x 10.5 x 14.2 cm = volume: 600.1 mL. There are at least 2 intramural leiomyomas with largest measuring up to 4.4 x 4.5 x 4.6 cm. Endometrium Thickness: 10.7 mm.  No focal abnormality visualized. Right ovary Right ovary was not distinctly visualized. No right adnexal mass seen. Left ovary Measurements: 3.3 x 3.9 x 4.2 cm = volume: 27.7 mL. There is a 2.2 x 2.5 x 3.2 cm anechoic structure in the left ovary, which may represent a follicular cyst versus senescent ovarian cyst depending on patient's menstruation status. No mural nodularity or internal vascularity seen. Other findings No abnormal free fluid. IMPRESSION: 1. Fibroid uterus. 2. There is a 3.2 cm anechoic non aggressive structure in the left ovary, as discussed above. 3. Nonvisualization of  the right ovary. No right adnexal mass seen. Electronically Signed   By: Jules Schick M.D.   On: 12/31/2022 15:45      MEDS ordered this encounter: Meds  ordered this encounter  Medications   0.9 %  sodium chloride infusion (Manually program via Guardrails IV Fluids)   DISCONTD: conjugated estrogens (PREMARIN) injection 25 mg   megestrol (MEGACE) tablet 120 mg   DISCONTD: leuprolide (LUPRON) injection 7.5 mg   sterile water (preservative free) injection    Shea Evans B: cabinet override   ferrous sulfate tablet 325 mg   albuterol (PROVENTIL) (2.5 MG/3ML) 0.083% nebulizer solution 2.5 mg   leuprolide (LUPRON) injection 7.5 mg   conjugated estrogens (PREMARIN) injection 25 mg   0.9 %  sodium chloride infusion (Manually program via Guardrails IV Fluids)   leuprolide (LUPRON) 7.5 MG injection    Sig: Inject 7.5 mg into the muscle once for 1 dose.    Dispense:  1 kit    Refill:  0   cyanocobalamin (VITAMIN B12) injection 1,000 mcg    Orders for this encounter: Orders Placed This Encounter  Procedures   US Pelvis Complete   US Transvaginal Non-OB   hCG, serum, qualitative   Basic metabolic panel   CBC with Differential/Platelet   Urinalysis, Routine w reflex microscopic -Urine, Clean Catch   Vitamin B12   Folate   Iron and TIBC   Ferritin   Reticulocytes   Protime-INR   TSH   HIV Antibody (routine testing w rflx)   Follicle stimulating hormone   Luteinizing hormone   DHEA-sulfate   Prolactin   CBC   Diet regular Room service appropriate? Yes; Fluid consistency: Thin   Initiate Carrier Fluid Protocol   Informed Consent Details: Physician/Practitioner Attestation; Transcribe to consent form and obtain patient signature   H & H post transfustion-  RN to place lab order with appropriate draw time   Page Admitting Doctor upon patients arrival to unit/floor   Vital signs   Up with assistance   Initiate Carrier Fluid Protocol   Vital signs   Notify physician (specify)   Mobility Protocol: No Restrictions   Refer to Sidebar Report Refer to ICU, Med-Surg, Progressive, and Step-Down Mobility Protocol Sidebars   Do not place  and if present remove PureWick   Initiate Oral Care Protocol   Initiate Carrier Fluid Protocol   RN may order General Admission PRN Orders utilizing "General Admission PRN medications" (through manage orders) for the following patient needs: allergy symptoms (Claritin), cold sores (Carmex), cough (Robitussin DM), eye irritation (Liquifilm Tears), hemorrhoids (Tucks), indigestion (Maalox), minor skin irritation (Hydrocortisone Cream), muscle pain Romeo Apple Gay), nose irritation (saline nasal spray) and sore throat (Chloraseptic spray).   SCDs   Patient has an active order for admit to inpatient/place in observation   Full code   Consult to family practice   Oxygen therapy Mode or (Route): Nasal cannula; Liters Per Minute: 2; Keep 02 saturation: greater than 92 %   EKG 12-Lead   Type and screen [facility]   Prepare RBC (crossmatch)   Prepare RBC (crossmatch)   Saline lock IV   Admit to Inpatient (patient's expected length of stay will be greater than 2 midnights or inpatient only procedure)   Place in observation (patient's expected length of stay will be less than 2 midnights)    Impression + Management Plan   ICD-10-CM   1. Abnormal vaginal bleeding  N93.9     2. Iron deficiency anemia  due to chronic blood loss  D50.0            All questions were answered.  Past Medical History:  Diagnosis Date   Bronchitis    Menorrhagia     Past Surgical History:  Procedure Laterality Date   CESAREAN SECTION  1990   TUBAL LIGATION      OB History     Gravida  5   Para  4   Term  4   Preterm      AB  1   Living  4      SAB  1   IAB      Ectopic      Multiple      Live Births  4           No Known Allergies  Social History   Socioeconomic History   Marital status: Divorced    Spouse name: Not on file   Number of children: Not on file   Years of education: Not on file   Highest education level: Not on file  Occupational History   Not  on file  Tobacco Use   Smoking status: Never   Smokeless tobacco: Never  Vaping Use   Vaping status: Never Used  Substance and Sexual Activity   Alcohol use: Yes    Comment: occasional   Drug use: No   Sexual activity: Not Currently    Partners: Male    Birth control/protection: Surgical  Other Topics Concern   Not on file  Social History Narrative   Not on file   Social Determinants of Health   Financial Resource Strain: Not on file  Food Insecurity: No Food Insecurity (12/31/2022)   Hunger Vital Sign    Worried About Running Out of Food in the Last Year: Never true    Ran Out of Food in the Last Year: Never true  Transportation Needs: No Transportation Needs (12/31/2022)   PRAPARE - Administrator, Civil Service (Medical): No    Lack of Transportation (Non-Medical): No  Physical Activity: Not on file  Stress: Not on file  Social Connections: Not on file    Family History  Problem Relation Age of Onset   Heart failure Mother

## 2023-01-01 NOTE — Plan of Care (Signed)

## 2023-01-01 NOTE — Discharge Summary (Signed)
Family Medicine Teaching [facility] Discharge Summary  Patient name: Joanne Hampton Medical record number: [id number] Date of birth: [birth year] Age: 52 y.o. Gender: female Date of Admission: 12/31/2022  Date of Discharge: 01/01/23 Admitting Physician: Evette Georges, MD  Primary Care Provider: Bess Kinds, MD Consultants: OBGYN  Indication for Hospitalization: anemia  Discharge Diagnoses/Problem List:  Principal Problem for Admission: anemia Other Problems addressed during stay:  Principal Problem:   Anemia due to acute blood loss Active Problems:   Hypertension   Abnormal vaginal bleeding    Brief Hospital Course:  Joanne Hampton is a 52 y.o. female who was admitted to the [facility] Medicine Teaching Service at [facility] for symptomatic IDA. Hospital course is outlined below by problem.   Symptomatic iron deficiency anemia Presented with dizziness and weakness in the context of heavy vaginal bleeding.  Mildly hypertensive, otherwise HDS.  Hemoglobin 4.4.  She was given 3 units PRBCs in total.  By discharge, Hgb improved to 7.8.  OB/GYN consulted who gave Megace, Lupron, IV estrogen for bleeding.  They recommended outpatient endometrial biopsy and likely definitive management with hysterectomy.  Pt was discharged on `0/`` and scheduled for OGBYN follow up.  Thrombocytosis Platelets elevated to 424 and admission.  No signs of infection on exam.  Likely reactive due to increased bone marrow response with severe anemia. Improved to 399,000 by the date of discharge.  Hypertension Presented mildly hypertensive to 154/64.  No medications at home.  Remained elevated throughout admission. Consider starting BP medications outpatient.  Other conditions that were chronic and stable: Asthma (continue as needed albuterol)  Issues for follow up: Recommend recheck CBC Assess blood pressure control Consider IV iron infusion in 1 week Ensure OBGYN follow up for outpatient  endometrial biopsy    Disposition: home  Discharge Condition: stable  Discharge Exam:  Vitals:   01/01/23 1515 01/01/23 1604  BP: (!) 162/70 (!) 152/73  Pulse: 84 89  Resp: 18 17  Temp: 99.2 F (37.3 C) 99.4 F (37.4 C)  SpO2: 100% 100%   General: Middle-age female sitting up in bed in no acute distress, pleasant, conversant Cardiovascular: RRR, normal S1/S2 Respiratory: CTAB, normal work of breathing on room air, no wheezes, rales, rhonchi Abdomen: Diminished bowel sounds, mildly tender to palpation of lower abdomen, soft, nondistended Extremities: No edema to bilateral lower extremities  Significant Procedures: transfusion 3 units pRBCs  Significant Labs and Imaging:  Recent Labs  Lab 12/31/22 1104 01/01/23 0745 01/01/23 1625  WBC 7.3 11.2*  --   HGB 4.4* 6.7* 7.8*  HCT 16.3* 21.6* 25.0*  PLT 424* 399  --    Recent Labs  Lab 12/31/22 1104  NA 139  K 4.0  CL 106  CO2 22  GLUCOSE 132*  BUN 6  CREATININE 0.87  CALCIUM 8.7*   TVUS 1. Fibroid uterus. 2. There is a 3.2 cm anechoic non aggressive structure in the left ovary, as discussed above. 3. Nonvisualization of the right ovary. No right adnexal mass seen.  Results/Tests Pending at Time of Discharge:  HIV test, LH, FSH, Prolactin, DHEA-S levels   Discharge Medications:  Allergies as of 01/01/2023   No Known Allergies      Medication List     TAKE these medications    albuterol 108 (90 Base) MCG/ACT inhaler Commonly known as: VENTOLIN HFA INHALE 2 PUFFS INTO THE LUNGS EVERY 6 HOURS AS NEEDED FOR WHEEZING OR SHORTNESS OF BREATH   ascorbic acid 500 MG tablet Commonly known as: VITAMIN C Take  1 tablet (500 mg total) by mouth daily.   ferrous sulfate 325 (65 FE) MG tablet Take 1 tablet (325 mg total) by mouth 2 (two) times daily with a meal.   megestrol 40 MG tablet Commonly known as: MEGACE Take 3 tablets (120 mg total) by mouth 4 (four) times daily.   multivitamin tablet Take 1 tablet  by mouth daily.        Discharge Instructions: Please refer to Patient Instructions section of EMR for full details.  Patient was counseled important signs and symptoms that should prompt return to medical care, changes in medications, dietary instructions, activity restrictions, and follow up appointments.   Follow-Up Appointments:   Lorayne Bender MD 01/01/2023, 5:16 PM PGY-1, [facility] Family Medicine  I agree with assessment and plan by Dr. Dolan Amen.  Darral Dash, DO PGY-3 [facility] Family Medicine

## 2023-01-01 NOTE — Assessment & Plan Note (Addendum)
PLT elevated to 424 on admission, improved to 399. Likely 2/2 reactive thrombocytosis due to severe anemia.

## 2023-01-02 LAB — TYPE AND SCREEN
ABO/RH(D): A POS
Antibody Screen: NEGATIVE
Unit division: 0
Unit division: 0
Unit division: 0

## 2023-01-02 LAB — LUTEINIZING HORMONE: LH: 1.8 m[IU]/mL

## 2023-01-02 LAB — BPAM RBC
Blood Product Expiration Date: 202411052359
Blood Product Expiration Date: 202411072359
Blood Product Expiration Date: 202411092359
ISSUE DATE / TIME: 202410101653
ISSUE DATE / TIME: 202410102236
ISSUE DATE / TIME: 202410111102
Unit Type and Rh: 6200
Unit Type and Rh: 6200
Unit Type and Rh: 6200

## 2023-01-02 LAB — PROLACTIN: Prolactin: 13.1 ng/mL (ref 3.6–25.2)

## 2023-01-02 LAB — DHEA-SULFATE: DHEA-SO4: 66.8 ug/dL (ref 41.2–243.7)

## 2023-01-02 LAB — FOLLICLE STIMULATING HORMONE: FSH: 6.9 m[IU]/mL

## 2023-01-02 NOTE — Progress Notes (Signed)
Discussed Megace dosing with patient this morning over telephone.  Discharge instructions erroneously had Megace 120 four times daily.  Explained to patient to only take 120 mg daily.  Advised to take 40 mg (1 tablet) 3 times a day. She states her bleeding has significantly improved. She has follow-up scheduled.

## 2023-01-04 ENCOUNTER — Other Ambulatory Visit (HOSPITAL_COMMUNITY): Payer: Self-pay

## 2023-01-05 ENCOUNTER — Encounter: Payer: Self-pay | Admitting: Obstetrics & Gynecology

## 2023-01-05 ENCOUNTER — Ambulatory Visit: Payer: Medicaid Other | Admitting: Obstetrics & Gynecology

## 2023-01-05 ENCOUNTER — Encounter: Payer: Self-pay | Admitting: Student

## 2023-01-05 ENCOUNTER — Other Ambulatory Visit (HOSPITAL_COMMUNITY)
Admission: RE | Admit: 2023-01-05 | Discharge: 2023-01-05 | Disposition: A | Discharge status: Home / Self Care | Payer: Medicaid Other | Source: Ambulatory Visit | Attending: Obstetrics & Gynecology | Admitting: Obstetrics & Gynecology

## 2023-01-05 ENCOUNTER — Ambulatory Visit (INDEPENDENT_AMBULATORY_CARE_PROVIDER_SITE_OTHER): Payer: Medicaid Other | Admitting: Student

## 2023-01-05 VITALS — BP 133/75 | HR 83 | Ht 62.0 in | Wt 252.0 lb

## 2023-01-05 VITALS — BP 136/68 | HR 79 | Wt 251.0 lb

## 2023-01-05 DIAGNOSIS — D649 Anemia, unspecified: Secondary | ICD-10-CM

## 2023-01-05 DIAGNOSIS — D500 Iron deficiency anemia secondary to blood loss (chronic): Secondary | ICD-10-CM

## 2023-01-05 DIAGNOSIS — R9389 Abnormal findings on diagnostic imaging of other specified body structures: Secondary | ICD-10-CM

## 2023-01-05 DIAGNOSIS — N840 Polyp of corpus uteri: Secondary | ICD-10-CM | POA: Diagnosis not present

## 2023-01-05 DIAGNOSIS — N921 Excessive and frequent menstruation with irregular cycle: Secondary | ICD-10-CM | POA: Diagnosis not present

## 2023-01-05 DIAGNOSIS — Z09 Encounter for follow-up examination after completed treatment for conditions other than malignant neoplasm: Secondary | ICD-10-CM | POA: Diagnosis not present

## 2023-01-05 NOTE — Patient Instructions (Signed)
It was great to see you! Thank you for allowing me to participate in your care!  Our plans for today:  [facility] F/u We saw you today for follow up after your hospitalization for abnormal uterine bleeding. We will check your blood today to see if there is any concern for bleeding/anemia again.    Depending on how your anemia looks, we may recommend an Iron Transfusion   Continue the megace until OB stops it. Follow up with them for refills.  We are checking some labs today, I will call you if they are abnormal will send you a MyChart message or a letter if they are normal.  If you do not hear about your labs in the next 2 weeks please let us know.  Take care and seek immediate care sooner if you develop any concerns.   Dr. Bess Kinds, MD [facility] Medicine

## 2023-01-05 NOTE — Progress Notes (Unsigned)
  SUBJECTIVE:   CHIEF COMPLAINT / HPI:   Hospital F/u -Admitted 10/10-10/11 for AUB, received transfusion x 3, w/ mild HTN  Today Is doing well, bleeding has significantly slowed and she is continuing the megace. Saw OB today and got endometrial bx. Awaiting results for that.   PERTINENT  PMH / PSH:   OBJECTIVE:  There were no vitals taken for this visit. Physical Exam Constitutional:      General: She is not in acute distress.    Appearance: Normal appearance. She is not ill-appearing.  Cardiovascular:     Rate and Rhythm: Normal rate and regular rhythm.     Pulses: Normal pulses.     Heart sounds: Normal heart sounds. No murmur heard.    No friction rub. No gallop.  Pulmonary:     Effort: Pulmonary effort is normal. No respiratory distress.     Breath sounds: Normal breath sounds. No stridor. No wheezing, rhonchi or rales.  Skin:    Capillary Refill: Capillary refill takes less than 2 seconds.  Neurological:     Mental Status: She is alert.  Psychiatric:        Mood and Affect: Mood normal.        Behavior: Behavior normal.      ASSESSMENT/PLAN:  There are no diagnoses linked to this encounter. No follow-ups on file. Bess Kinds, MD 01/05/2023, 8:44 AM PGY-3, [place] [facility] {    This will disappear when note is signed, click to select method of visit    :1}

## 2023-01-05 NOTE — Progress Notes (Signed)
Endometrial Biopsy Procedure Note  Pre-operative Diagnosis: perimenoapusal HMB with ES 10.4 mm, needs pre operative EMB, hemoglobin was 4.4  Post-operative Diagnosis: same  Indications: postmenopausal bleeding  Procedure Details   Urine pregnancy test was not done.  The risks (including infection, bleeding, pain, and uterine perforation) and benefits of the procedure were explained to the patient and Written informed consent was obtained.  Antibiotic prophylaxis against endocarditis was not indicated.   The patient was placed in the dorsal lithotomy position.  Bimanual exam showed the uterus to be in the neutral position.  A Graves' speculum inserted in the vagina, and the cervix prepped with povidone iodine.  Endocervical curettage with a Kevorkian curette was not performed.   A sharp tenaculum was applied to the anterior lip of the cervix for stabilization.  A sterile uterine sound was used to sound the uterus to a depth of 10 cm.  A Pipelle endometrial aspirator was used to sample the endometrium.  Sample was sent for pathologic examination.  Condition: Stable  Complications: None  Plan:  The patient was advised to call for any fever or for prolonged or severe pain or bleeding. She was advised to use OTC analgesics as needed for mild to moderate pain. She was advised to avoid vaginal intercourse for 48 hours or until the bleeding has completely stopped.  Attending Physician Documentation: I was present for or performed the following: endometrial biopsy

## 2023-01-06 LAB — CBC
Hematocrit: 23.2 % — ABNORMAL LOW (ref 34.0–46.6)
Hemoglobin: 7 g/dL — CL (ref 11.1–15.9)
MCH: 26.3 pg — ABNORMAL LOW (ref 26.6–33.0)
MCHC: 30.2 g/dL — ABNORMAL LOW (ref 31.5–35.7)
MCV: 87 fL (ref 79–97)
NRBC: 1 % — ABNORMAL HIGH (ref 0–0)
Platelets: 376 10*3/uL (ref 150–450)
RBC: 2.66 x10E6/uL — CL (ref 3.77–5.28)
RDW: 19.3 % — ABNORMAL HIGH (ref 11.7–15.4)
WBC: 7.3 10*3/uL (ref 3.4–10.8)

## 2023-01-06 LAB — SURGICAL PATHOLOGY

## 2023-01-06 NOTE — Assessment & Plan Note (Signed)
Patient has follow-up for hospitalization for anemia secondary to abnormal uterine bleeding.  Patient reports she is doing well, bleeding is well-controlled, continues to take the Megace.  Patient also followed up with gynecology today, had endometrial biopsy, is awaiting results.  Plan to continue Megace until gynecology makes further recommendations.  Will check CBC today. - CBC - Continue Megace - Follow-up with gynecology

## 2023-01-08 ENCOUNTER — Other Ambulatory Visit: Payer: Medicaid Other

## 2023-01-08 ENCOUNTER — Telehealth: Payer: Self-pay | Admitting: Family Medicine

## 2023-01-08 DIAGNOSIS — D649 Anemia, unspecified: Secondary | ICD-10-CM

## 2023-01-08 LAB — POCT HEMOGLOBIN: Hemoglobin: 8.2 g/dL — AB (ref 11–14.6)

## 2023-01-08 NOTE — Telephone Encounter (Signed)
Called and discussed with patient Hgb returned 7.0 on Tuesday. She had discussed with Dr Barbaraann Faster and planning to recheck on Monday. Discussed her bleeding has greatly slowed, still having some spotting but only using a panty liner and not pads. She denies chest pain, shortness of breath with exertion or rest, dizziness or lightheadedness. Discussed coming today to recheck Hgb and ensure not dropping further as she has had a 0.8 drop since last week with hospital discharge. She is in agreement with plan, future lab ordered for POC Hgb, if stable no further intervention but if < 7 will do confirmatory CBC stat and consider need for transfusion/iron infusion.   Burley Saver MD

## 2023-01-11 ENCOUNTER — Ambulatory Visit: Payer: Medicaid Other | Admitting: Student

## 2023-01-11 ENCOUNTER — Encounter: Payer: Self-pay | Admitting: Student

## 2023-01-11 ENCOUNTER — Encounter: Payer: Self-pay | Admitting: Obstetrics & Gynecology

## 2023-01-11 ENCOUNTER — Other Ambulatory Visit: Payer: Self-pay

## 2023-01-11 VITALS — BP 155/76 | HR 96 | Ht 62.0 in | Wt 256.0 lb

## 2023-01-11 DIAGNOSIS — D500 Iron deficiency anemia secondary to blood loss (chronic): Secondary | ICD-10-CM

## 2023-01-11 DIAGNOSIS — R030 Elevated blood-pressure reading, without diagnosis of hypertension: Secondary | ICD-10-CM | POA: Diagnosis not present

## 2023-01-11 NOTE — Progress Notes (Cosign Needed Addendum)
  SUBJECTIVE:   CHIEF COMPLAINT / HPI:   F/u low Hgb -Seen 10/15 for hospital f/u for vaginal bleeding requiring transfusion. Noted to have low Hgb of 7, wanted to wait and f/u.  Today: Doing well, notes some fatigue w/ light activity and a little dizziness when she get's up from laying. But is otherwise feeling well. Note's vaginal bleeding has nearly stopped, she is having some spotting. Patient continues to take her iron.    PERTINENT  PMH / PSH:   OBJECTIVE:  BP (!) 155/76   Pulse 96   Ht 5\' 2"  (1.575 m)   Wt 256 lb (116.1 kg)   LMP 12/17/2022   SpO2 100%   BMI 46.82 kg/m  Physical Exam Constitutional:      General: She is not in acute distress.    Appearance: Normal appearance. She is not ill-appearing.  Cardiovascular:     Rate and Rhythm: Normal rate and regular rhythm.     Pulses: Normal pulses.     Heart sounds: Normal heart sounds. No murmur heard.    No friction rub. No gallop.  Pulmonary:     Effort: Pulmonary effort is normal. No respiratory distress.     Breath sounds: Normal breath sounds. No stridor. No wheezing, rhonchi or rales.  Skin:    Capillary Refill: Capillary refill takes less than 2 seconds.  Neurological:     Mental Status: She is alert.      ASSESSMENT/PLAN:  Anemia due to acute blood loss Assessment & Plan: Patient comes in for follow-up of her anemia, secondary to acute blood loss secondary to vaginal bleeding.  Patient appreciates vaginal bleeding is resolving, and she is having light spotting now.  Patient appreciates she is feeling well, but does note some fatigue with light activity, and some dizziness if she gets up from lying.  Will recheck hemoglobin today.  Plan for iron transfusion if hemoglobin around 7, plan for blood transfusion if hemoglobin under 7. If anemia thought to be from other causes, and not vaginal bleeding, will consider colonoscopy. Patient is due for a colon cancer screen.  - CBC -Consider colonoscopy in  future  Orders: -     CBC  Elevated BP without diagnosis of hypertension Assessment & Plan: Patient blood pressure elevated today.  Patient was not rechecked.  Patient has not had previous history of elevated blood pressures, but will continue to monitor. - Consider BP at next checkup    No follow-ups on file. Bess Kinds, MD 01/13/2023, 3:36 PM PGY-3, [facility] Family Medicine

## 2023-01-11 NOTE — Assessment & Plan Note (Signed)
Patient comes in for follow-up of her anemia, secondary to acute blood loss secondary to vaginal bleeding.  Patient appreciates vaginal bleeding is resolving, and she is having light spotting now.  Patient appreciates she is feeling well, but does note some fatigue with light activity, and some dizziness if she gets up from lying.  Will recheck hemoglobin today.  Plan for iron transfusion if hemoglobin around 7, plan for blood transfusion if hemoglobin under 7. - CBC

## 2023-01-11 NOTE — Patient Instructions (Signed)
It was great to see you! Thank you for allowing me to participate in your care!   Our plans for today:  - Anemia  We are checking your blood for anemia. Head to the [facility] across the street from the clinic. I should know the results within the next day or two.   If hemoglobin is ~ 7, we will get you scheduled for an iron transfusion  If hemoglobin is less than 7, we will send you to the ED for a blood transfusion.  [facility] Address: [address], [place], [place] [address] Phone: [phone]   We are checking some labs today, I will call you if they are abnormal will send you a MyChart message or a letter if they are normal.  If you do not hear about your labs in the next 2 weeks please let us know.  Take care and seek immediate care sooner if you develop any concerns.   Dr. Bess Kinds, MD [facility] Medicine

## 2023-01-11 NOTE — Assessment & Plan Note (Signed)
Patient blood pressure elevated today.  Patient was not rechecked.  Patient has not had previous history of elevated blood pressures, but will continue to monitor. - Consider BP at next checkup

## 2023-01-12 LAB — CBC
Hematocrit: 26.3 % — ABNORMAL LOW (ref 34.0–46.6)
Hemoglobin: 8 g/dL — ABNORMAL LOW (ref 11.1–15.9)
MCH: 25.6 pg — ABNORMAL LOW (ref 26.6–33.0)
MCHC: 30.4 g/dL — ABNORMAL LOW (ref 31.5–35.7)
MCV: 84 fL (ref 79–97)
Platelets: 488 10*3/uL — ABNORMAL HIGH (ref 150–450)
RBC: 3.12 x10E6/uL — ABNORMAL LOW (ref 3.77–5.28)
RDW: 23 % — ABNORMAL HIGH (ref 11.7–15.4)
WBC: 7.7 10*3/uL (ref 3.4–10.8)

## 2023-01-25 ENCOUNTER — Encounter: Payer: Self-pay | Admitting: Obstetrics & Gynecology

## 2023-01-27 ENCOUNTER — Telehealth: Payer: Self-pay

## 2023-01-27 NOTE — Telephone Encounter (Signed)
Patient calls nurse line requesting an updated letter for work.   She reports she thought she would be able to return to full duty the week of October 27th, however she is not able to do this.  She reports she needs an updated letter requesting light duty.   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   Advised will forward to PCP.

## 2023-01-28 ENCOUNTER — Encounter: Payer: Self-pay | Admitting: Hematology and Oncology

## 2023-01-29 NOTE — Telephone Encounter (Signed)
Called to discuss need for change in duty status with patient.  Patient reports that she is returned to work however she is struggling with fatigue and driving long hours, 7+ hours in the car.  Patient does not feel like she can do this at this time.  Is requesting for light duty hours ideally away from driving.  Given patient had significantly low hemoglobin, make sense that patient is suffering with fatigue.  Will provide letter requesting light duty hours, and to avoid driving.  Will write letter for the rest of the month.  Patient to follow-up with provider if needing other assistance.  Letter written and left at front desk

## 2023-02-01 ENCOUNTER — Encounter: Payer: Self-pay | Admitting: Hematology and Oncology

## 2023-02-03 DIAGNOSIS — G4733 Obstructive sleep apnea (adult) (pediatric): Secondary | ICD-10-CM | POA: Diagnosis not present

## 2023-02-10 ENCOUNTER — Telehealth: Payer: Self-pay

## 2023-02-10 ENCOUNTER — Encounter: Payer: Self-pay | Admitting: Obstetrics & Gynecology

## 2023-02-10 NOTE — Telephone Encounter (Signed)
Patient calls nurse line requesting a refill in Megace.   She reports she was given a supply at the hospital at discharge and was told to stay on this medication until surgery.   She reports she is out of that supply and is requesting a refill to [facility] on [address].  I do not see this on her medication list.   It appears she is to FU with GYN for refills.  Advised to contact their office for refills.  Patient agreed with plan.

## 2023-02-23 MED ORDER — MEGESTROL ACETATE 40 MG PO TABS: 120.0000 mg | ORAL_TABLET | Freq: Every day | ORAL | 3 refills | Status: DC

## 2023-02-23 NOTE — Addendum Note (Signed)
Addended by: Lazaro Arms on: 02/23/2023 11:51 AM   Modules accepted: Orders

## 2023-02-24 NOTE — Patient Instructions (Signed)
[facility]  02/24/2023     @PREFPERIOPPHARMACY @   Your procedure is scheduled on  03/02/2023.   Report to [facility] at  [phone]  A.M.   Call this number if you have problems the morning of surgery:  [phone]  If you experience any cold or flu symptoms such as cough, fever, chills, shortness of breath, etc. between now and your scheduled surgery, please notify us at the above number.   Remember:          Use your inhaler before you come and bring your rescue inhaler with you.   Do not eat after midnight.    You may drink clear liquids until 0710 am on 03/02/2023.    Clear liquids allowed are:                    Water, Juice (No red color; non-citric and without pulp; diabetics please choose diet or no sugar options), Carbonated beverages (diabetics please choose diet or no sugar options), Clear Tea (No creamer, milk, or cream, including half & half and powdered creamer), Black Coffee Only (No creamer, milk or cream, including half & half and powdered creamer), and Clear Sports drink (No red color; diabetics please choose diet or no sugar options)       At 0710 am on 03/02/2023 drink your carb drink. You can have nothing else to drink after this.    Take these medicines the morning of surgery with A SIP OF WATER                                         None.    Do not wear jewelry, make-up or nail polish, including gel polish,  artificial nails, or any other type of covering on natural nails (fingers and  toes).  Do not wear lotions, powders, or perfumes, or deodorant.  Do not shave 48 hours prior to surgery.  Men may shave face and neck.  Do not bring valuables to the hospital.  [facility] is not responsible for any belongings or valuables.  Contacts, dentures or bridgework may not be worn into surgery.  Leave your suitcase in the car.  After surgery it may be brought to your room.  For patients admitted to the hospital, discharge time will be  determined by your treatment team.  Patients discharged the day of surgery will not be allowed to drive home and must have someone with them for 24 hours.    Special instructions:   DO NOT smoke tobacco or vape for 24 hours before your procedure.  Please read over the following fact sheets that you were given. Pain Booklet, Coughing and Deep Breathing, Blood Transfusion Information, MRSA Information, Surgical Site Infection Prevention, Anesthesia Post-op Instructions, and Care and Recovery After Surgery      Total Laparoscopic Hysterectomy, Care After The following information offers guidance on how to care for yourself after your procedure. Your health care provider may also give you more specific instructions. If you have problems or questions, contact your health care provider. What can I expect after the procedure? After the procedure, it is common to have: Pain, bruising, and numbness around your incisions. Tiredness (fatigue). Poor appetite. Less interest in sex. Vaginal discharge or bleeding. You will need to use a sanitary pad after this procedure. Feelings of sadness or other emotions.  If your ovaries were also removed, it is also common to have symptoms of menopause, such as hot flashes, night sweats, and lack of sleep (insomnia). Follow these instructions at home: Medicines Take over-the-counter and prescription medicines only as told by your health care provider. Ask your health care provider if the medicine prescribed to you: Requires you to avoid driving or using machinery. Can cause constipation. You may need to take these actions to prevent or treat constipation: Drink enough fluid to keep your urine pale yellow. Take over-the-counter or prescription medicines. Eat foods that are high in fiber, such as beans, whole grains, and fresh fruits and vegetables. Limit foods that are high in fat and processed sugars, such as fried or sweet foods. Incision care  Follow  instructions from your health care provider about how to take care of your incisions. Make sure you: Wash your hands with soap and water for at least 20 seconds before and after you change your bandage (dressing). If soap and water are not available, use hand sanitizer. Change your dressing as told by your health care provider. Leave stitches (sutures), skin glue, or adhesive strips in place. These skin closures may need to stay in place for 2 weeks or longer. If adhesive strip edges start to loosen and curl up, you may trim the loose edges. Do not remove adhesive strips completely unless your health care provider tells you to do that. Check your incision areas every day for signs of infection. Check for: More redness, swelling, or pain. Fluid or blood. Warmth. Pus or a bad smell. Activity  Rest as told by your health care provider. Avoid sitting for a long time without moving. Get up to take short walks every 1-2 hours. This is important to improve blood flow and breathing. Ask for help if you feel weak or unsteady. Return to your normal activities as told by your health care provider. Ask your health care provider what activities are safe for you. Do not lift anything that is heavier than 10 lb (4.5 kg), or the limit that you are told, for one month after surgery or until your health care provider says that it is safe. If you were given a sedative during the procedure, it can affect you for several hours. Do not drive or operate machinery until your health care provider says that it is safe. Lifestyle Do not use any products that contain nicotine or tobacco. These products include cigarettes, chewing tobacco, and vaping devices, such as e-cigarettes. These can delay healing after surgery. If you need help quitting, ask your health care provider. Do not drink alcohol until your health care provider approves. General instructions  Do not douche, use tampons, or have sex for at least 6 weeks, or  as told by your health care provider. If you struggle with physical or emotional changes after your procedure, speak with your health care provider or a therapist. Do not take baths, swim, or use a hot tub until your health care provider approves. You may only be allowed to take showers for 2-3 weeks. Keep your dressing dry until your health care provider says it can be removed. Try to have someone at home with you for the first 1-2 weeks to help with your daily chores. Wear compression stockings as told by your health care provider. These stockings help to prevent blood clots and reduce swelling in your legs. Keep all follow-up visits. This is important. Contact a health care provider if: You have any of these  signs of infection: Chills or a fever. More redness, swelling, or pain around an incision. Fluid or blood coming from an incision. Warmth coming from an incision. Pus or a bad smell coming from an incision. An incision opens. You feel dizzy or light-headed. You have pain or bleeding when you urinate, or you are unable to urinate. You have abnormal vaginal discharge. You have pain that does not get better with medicine. Get help right away if: You have a fever and your symptoms suddenly get worse. You have severe abdominal pain. You have chest pain or shortness of breath. You faint. You have pain, swelling, or redness in your leg. You have heavy vaginal bleeding with blood clots, soaking through a sanitary pad in less than 1 hour. These symptoms may represent a serious problem that is an emergency. Do not wait to see if the symptoms will go away. Get medical help right away. Call your local emergency services (911 in the [place] Do not drive yourself to the hospital. Summary After the procedure, it is common to have pain and bruising around your incisions. Do not take baths, swim, or use a hot tub until your health care provider approves. Do not lift anything that is heavier than  10 lb (4.5 kg), or the limit that you are told, for one month after surgery or until your health care provider says that it is safe. Tell your health care provider if you have any signs or symptoms of infection after the procedure. Get help right away if you have severe abdominal pain, chest pain, shortness of breath, or heavy bleeding from your vagina. This information is not intended to replace advice given to you by your health care provider. Make sure you discuss any questions you have with your health care provider. Document Revised: 11/09/2019 Document Reviewed: 11/10/2019 Elsevier Patient Education  2024 Else[employer]. General Anesthesia, Adult, Care After The following information offers guidance on how to care for yourself after your procedure. Your health care provider may also give you more specific instructions. If you have problems or questions, contact your health care provider. What can I expect after the procedure? After the procedure, it is common for people to: Have pain or discomfort at the IV site. Have nausea or vomiting. Have a sore throat or hoarseness. Have trouble concentrating. Feel cold or chills. Feel weak, sleepy, or tired (fatigue). Have soreness and body aches. These can affect parts of the body that were not involved in surgery. Follow these instructions at home: For the time period you were told by your health care provider:  Rest. Do not participate in activities where you could fall or become injured. Do not drive or use machinery. Do not drink alcohol. Do not take sleeping pills or medicines that cause drowsiness. Do not make important decisions or sign legal documents. Do not take care of children on your own. General instructions Drink enough fluid to keep your urine pale yellow. If you have sleep apnea, surgery and certain medicines can increase your risk for breathing problems. Follow instructions from your health care provider about wearing your  sleep device: Anytime you are sleeping, including during daytime naps. While taking prescription pain medicines, sleeping medicines, or medicines that make you drowsy. Return to your normal activities as told by your health care provider. Ask your health care provider what activities are safe for you. Take over-the-counter and prescription medicines only as told by your health care provider. Do not use any products that contain nicotine  or tobacco. These products include cigarettes, chewing tobacco, and vaping devices, such as e-cigarettes. These can delay incision healing after surgery. If you need help quitting, ask your health care provider. Contact a health care provider if: You have nausea or vomiting that does not get better with medicine. You vomit every time you eat or drink. You have pain that does not get better with medicine. You cannot urinate or have bloody urine. You develop a skin rash. You have a fever. Get help right away if: You have trouble breathing. You have chest pain. You vomit blood. These symptoms may be an emergency. Get help right away. Call 911. Do not wait to see if the symptoms will go away. Do not drive yourself to the hospital. Summary After the procedure, it is common to have a sore throat, hoarseness, nausea, vomiting, or to feel weak, sleepy, or fatigue. For the time period you were told by your health care provider, do not drive or use machinery. Get help right away if you have difficulty breathing, have chest pain, or vomit blood. These symptoms may be an emergency. This information is not intended to replace advice given to you by your health care provider. Make sure you discuss any questions you have with your health care provider. Document Revised: 06/06/2021 Document Reviewed: 06/06/2021 Elsevier Patient Education  2024 Else[employer] Inc. How to Use Chlorhexidine Before Surgery Chlorhexidine gluconate (CHG) is a germ-killing (antiseptic) solution that  is used to clean the skin. It can get rid of the bacteria that normally live on the skin and can keep them away for about 24 hours. To clean your skin with CHG, you may be given: A CHG solution to use in the shower or as part of a sponge bath. A prepackaged cloth that contains CHG. Cleaning your skin with CHG may help lower the risk for infection: While you are staying in the intensive care unit of the hospital. If you have a vascular access, such as a central line, to provide short-term or long-term access to your veins. If you have a catheter to drain urine from your bladder. If you are on a ventilator. A ventilator is a machine that helps you breathe by moving air in and out of your lungs. After surgery. What are the risks? Risks of using CHG include: A skin reaction. Hearing loss, if CHG gets in your ears and you have a perforated eardrum. Eye injury, if CHG gets in your eyes and is not rinsed out. The CHG product catching fire. Make sure that you avoid smoking and flames after applying CHG to your skin. Do not use CHG: If you have a chlorhexidine allergy or have previously reacted to chlorhexidine. On babies younger than 97 months of age. How to use CHG solution Use CHG only as told by your health care provider, and follow the instructions on the label. Use the full amount of CHG as directed. Usually, this is one bottle. During a shower Follow these steps when using CHG solution during a shower (unless your health care provider gives you different instructions): Start the shower. Use your normal soap and shampoo to wash your face and hair. Turn off the shower or move out of the shower stream. Pour the CHG onto a clean washcloth. Do not use any type of brush or rough-edged sponge. Starting at your neck, lather your body down to your toes. Make sure you follow these instructions: If you will be having surgery, pay special attention to the part of  your body where you will be having  surgery. Scrub this area for at least 1 minute. Do not use CHG on your head or face. If the solution gets into your ears or eyes, rinse them well with water. Avoid your genital area. Avoid any areas of skin that have broken skin, cuts, or scrapes. Scrub your back and under your arms. Make sure to wash skin folds. Let the lather sit on your skin for 1-2 minutes or as long as told by your health care provider. Thoroughly rinse your entire body in the shower. Make sure that all body creases and crevices are rinsed well. Dry off with a clean towel. Do not put any substances on your body afterward--such as powder, lotion, or perfume--unless you are told to do so by your health care provider. Only use lotions that are recommended by the manufacturer. Put on clean clothes or pajamas. If it is the night before your surgery, sleep in clean sheets.  During a sponge bath Follow these steps when using CHG solution during a sponge bath (unless your health care provider gives you different instructions): Use your normal soap and shampoo to wash your face and hair. Pour the CHG onto a clean washcloth. Starting at your neck, lather your body down to your toes. Make sure you follow these instructions: If you will be having surgery, pay special attention to the part of your body where you will be having surgery. Scrub this area for at least 1 minute. Do not use CHG on your head or face. If the solution gets into your ears or eyes, rinse them well with water. Avoid your genital area. Avoid any areas of skin that have broken skin, cuts, or scrapes. Scrub your back and under your arms. Make sure to wash skin folds. Let the lather sit on your skin for 1-2 minutes or as long as told by your health care provider. Using a different clean, wet washcloth, thoroughly rinse your entire body. Make sure that all body creases and crevices are rinsed well. Dry off with a clean towel. Do not put any substances on your body  afterward--such as powder, lotion, or perfume--unless you are told to do so by your health care provider. Only use lotions that are recommended by the manufacturer. Put on clean clothes or pajamas. If it is the night before your surgery, sleep in clean sheets. How to use CHG prepackaged cloths Only use CHG cloths as told by your health care provider, and follow the instructions on the label. Use the CHG cloth on clean, dry skin. Do not use the CHG cloth on your head or face unless your health care provider tells you to. When washing with the CHG cloth: Avoid your genital area. Avoid any areas of skin that have broken skin, cuts, or scrapes. Before surgery Follow these steps when using a CHG cloth to clean before surgery (unless your health care provider gives you different instructions): Using the CHG cloth, vigorously scrub the part of your body where you will be having surgery. Scrub using a back-and-forth motion for 3 minutes. The area on your body should be completely wet with CHG when you are done scrubbing. Do not rinse. Discard the cloth and let the area air-dry. Do not put any substances on the area afterward, such as powder, lotion, or perfume. Put on clean clothes or pajamas. If it is the night before your surgery, sleep in clean sheets.  For general bathing Follow these steps when using CHG  cloths for general bathing (unless your health care provider gives you different instructions). Use a separate CHG cloth for each area of your body. Make sure you wash between any folds of skin and between your fingers and toes. Wash your body in the following order, switching to a new cloth after each step: The front of your neck, shoulders, and chest. Both of your arms, under your arms, and your hands. Your stomach and groin area, avoiding the genitals. Your right leg and foot. Your left leg and foot. The back of your neck, your back, and your buttocks. Do not rinse. Discard the cloth and  let the area air-dry. Do not put any substances on your body afterward--such as powder, lotion, or perfume--unless you are told to do so by your health care provider. Only use lotions that are recommended by the manufacturer. Put on clean clothes or pajamas. Contact a health care provider if: Your skin gets irritated after scrubbing. You have questions about using your solution or cloth. You swallow any chlorhexidine. Call your local poison control center [phone])324-5460 in the [place] Get help right away if: Your eyes itch badly, or they become very red or swollen. Your skin itches badly and is red or swollen. Your hearing changes. You have trouble seeing. You have swelling or tingling in your mouth or throat. You have trouble breathing. These symptoms may represent a serious problem that is an emergency. Do not wait to see if the symptoms will go away. Get medical help right away. Call your local emergency services (911 in the [place] Do not drive yourself to the hospital. Summary Chlorhexidine gluconate (CHG) is a germ-killing (antiseptic) solution that is used to clean the skin. Cleaning your skin with CHG may help to lower your risk for infection. You may be given CHG to use for bathing. It may be in a bottle or in a prepackaged cloth to use on your skin. Carefully follow your health care provider's instructions and the instructions on the product label. Do not use CHG if you have a chlorhexidine allergy. Contact your health care provider if your skin gets irritated after scrubbing. This information is not intended to replace advice given to you by your health care provider. Make sure you discuss any questions you have with your health care provider. Document Revised: 07/07/2021 Document Reviewed: 05/20/2020 Elsevier Patient Education  2023 [employer].

## 2023-02-25 ENCOUNTER — Other Ambulatory Visit: Payer: Self-pay | Admitting: Obstetrics & Gynecology

## 2023-02-25 DIAGNOSIS — Z01818 Encounter for other preprocedural examination: Secondary | ICD-10-CM

## 2023-02-26 ENCOUNTER — Encounter (HOSPITAL_COMMUNITY)
Admission: RE | Admit: 2023-02-26 | Discharge: 2023-02-26 | Disposition: A | Discharge status: Home / Self Care | Payer: Medicaid Other | Source: Ambulatory Visit | Attending: Obstetrics & Gynecology

## 2023-02-26 ENCOUNTER — Encounter (HOSPITAL_COMMUNITY): Payer: Self-pay

## 2023-02-26 ENCOUNTER — Other Ambulatory Visit (HOSPITAL_COMMUNITY)
Admission: RE | Admit: 2023-02-26 | Discharge: 2023-02-26 | Disposition: A | Discharge status: Home / Self Care | Payer: Medicaid Other | Source: Ambulatory Visit | Attending: Obstetrics & Gynecology | Admitting: Obstetrics & Gynecology

## 2023-02-26 ENCOUNTER — Other Ambulatory Visit: Payer: Self-pay

## 2023-02-26 ENCOUNTER — Other Ambulatory Visit: Payer: Self-pay | Admitting: Obstetrics & Gynecology

## 2023-02-26 DIAGNOSIS — R9431 Abnormal electrocardiogram [ECG] [EKG]: Secondary | ICD-10-CM | POA: Insufficient documentation

## 2023-02-26 DIAGNOSIS — Z01818 Encounter for other preprocedural examination: Secondary | ICD-10-CM | POA: Insufficient documentation

## 2023-02-26 LAB — URINALYSIS, ROUTINE W REFLEX MICROSCOPIC
Bacteria, UA: NONE SEEN
Bilirubin Urine: NEGATIVE
Glucose, UA: NEGATIVE mg/dL
Ketones, ur: NEGATIVE mg/dL
Leukocytes,Ua: NEGATIVE
Nitrite: NEGATIVE
Protein, ur: 30 mg/dL — AB
Specific Gravity, Urine: 1.023 (ref 1.005–1.030)
pH: 5 (ref 5.0–8.0)

## 2023-02-26 LAB — CBC
HCT: 35.9 % — ABNORMAL LOW (ref 36.0–46.0)
Hemoglobin: 10.7 g/dL — ABNORMAL LOW (ref 12.0–15.0)
MCH: 26 pg (ref 26.0–34.0)
MCHC: 29.8 g/dL — ABNORMAL LOW (ref 30.0–36.0)
MCV: 87.3 fL (ref 80.0–100.0)
Platelets: 396 10*3/uL (ref 150–400)
RBC: 4.11 MIL/uL (ref 3.87–5.11)
RDW: 19.2 % — ABNORMAL HIGH (ref 11.5–15.5)
WBC: 6.7 10*3/uL (ref 4.0–10.5)
nRBC: 0 % (ref 0.0–0.2)

## 2023-02-26 LAB — TYPE AND SCREEN
ABO/RH(D): A POS
Antibody Screen: NEGATIVE

## 2023-02-26 LAB — COMPREHENSIVE METABOLIC PANEL
ALT: 13 U/L (ref 0–44)
AST: 17 U/L (ref 15–41)
Albumin: 3.7 g/dL (ref 3.5–5.0)
Alkaline Phosphatase: 72 U/L (ref 38–126)
Anion gap: 9 (ref 5–15)
BUN: 10 mg/dL (ref 6–20)
CO2: 22 mmol/L (ref 22–32)
Calcium: 9.3 mg/dL (ref 8.9–10.3)
Chloride: 108 mmol/L (ref 98–111)
Creatinine, Ser: 0.88 mg/dL (ref 0.44–1.00)
GFR, Estimated: >60 mL/min (ref >60)
Glucose, Bld: 91 mg/dL (ref 70–99)
Potassium: 3.8 mmol/L (ref 3.5–5.1)
Sodium: 139 mmol/L (ref 135–145)
Total Bilirubin: 0.2 mg/dL (ref <1.2)
Total Protein: 8.3 g/dL — ABNORMAL HIGH (ref 6.5–8.1)

## 2023-02-26 LAB — RAPID HIV SCREEN (HIV 1/2 AB+AG)
HIV 1/2 Antibodies: NONREACTIVE
HIV-1 P24 Antigen - HIV24: NONREACTIVE

## 2023-02-26 LAB — PREGNANCY, URINE: Preg Test, Ur: NEGATIVE

## 2023-02-26 MED ORDER — MEGESTROL ACETATE 40 MG PO TABS: 120.0000 mg | ORAL_TABLET | Freq: Every day | ORAL | 0 refills | Status: DC

## 2023-02-26 NOTE — Progress Notes (Signed)
Called Dr. Despina Hidden regarding pt's question on restarting Megace. Dr Despina Hidden stated he wants pt to continue taking medication.   RN called patient and told her that she is to continue taking medication. Medication called in at pharmacy provided.

## 2023-03-02 ENCOUNTER — Ambulatory Visit (HOSPITAL_COMMUNITY)
Admission: RE | Admit: 2023-03-02 | Discharge: 2023-03-02 | Disposition: A | Discharge status: Home / Self Care | Payer: Medicaid Other | Attending: Obstetrics & Gynecology | Admitting: Obstetrics & Gynecology

## 2023-03-02 ENCOUNTER — Encounter (HOSPITAL_COMMUNITY)
Admission: RE | Disposition: A | Discharge status: Home / Self Care | Payer: Self-pay | Source: Home / Self Care | Attending: Obstetrics & Gynecology

## 2023-03-02 ENCOUNTER — Encounter (HOSPITAL_COMMUNITY): Payer: Self-pay | Admitting: Obstetrics & Gynecology

## 2023-03-02 ENCOUNTER — Ambulatory Visit (HOSPITAL_COMMUNITY): Payer: Medicaid Other | Admitting: Certified Registered"

## 2023-03-02 DIAGNOSIS — N8003 Adenomyosis of the uterus: Secondary | ICD-10-CM | POA: Insufficient documentation

## 2023-03-02 DIAGNOSIS — I10 Essential (primary) hypertension: Secondary | ICD-10-CM | POA: Insufficient documentation

## 2023-03-02 DIAGNOSIS — N921 Excessive and frequent menstruation with irregular cycle: Secondary | ICD-10-CM | POA: Insufficient documentation

## 2023-03-02 DIAGNOSIS — N83202 Unspecified ovarian cyst, left side: Secondary | ICD-10-CM | POA: Diagnosis not present

## 2023-03-02 DIAGNOSIS — E6689 Other obesity not elsewhere classified: Secondary | ICD-10-CM | POA: Diagnosis not present

## 2023-03-02 DIAGNOSIS — D251 Intramural leiomyoma of uterus: Secondary | ICD-10-CM | POA: Diagnosis not present

## 2023-03-02 DIAGNOSIS — N8302 Follicular cyst of left ovary: Secondary | ICD-10-CM | POA: Insufficient documentation

## 2023-03-02 DIAGNOSIS — Z8249 Family history of ischemic heart disease and other diseases of the circulatory system: Secondary | ICD-10-CM | POA: Diagnosis not present

## 2023-03-02 DIAGNOSIS — D219 Benign neoplasm of connective and other soft tissue, unspecified: Secondary | ICD-10-CM

## 2023-03-02 DIAGNOSIS — N83291 Other ovarian cyst, right side: Secondary | ICD-10-CM | POA: Diagnosis not present

## 2023-03-02 DIAGNOSIS — N838 Other noninflammatory disorders of ovary, fallopian tube and broad ligament: Secondary | ICD-10-CM | POA: Diagnosis not present

## 2023-03-02 DIAGNOSIS — Z6841 Body Mass Index (BMI) 40.0 and over, adult: Secondary | ICD-10-CM | POA: Insufficient documentation

## 2023-03-02 DIAGNOSIS — N7011 Chronic salpingitis: Secondary | ICD-10-CM | POA: Diagnosis not present

## 2023-03-02 DIAGNOSIS — D259 Leiomyoma of uterus, unspecified: Secondary | ICD-10-CM

## 2023-03-02 DIAGNOSIS — D500 Iron deficiency anemia secondary to blood loss (chronic): Secondary | ICD-10-CM

## 2023-03-02 DIAGNOSIS — N83292 Other ovarian cyst, left side: Secondary | ICD-10-CM | POA: Diagnosis not present

## 2023-03-02 DIAGNOSIS — N83201 Unspecified ovarian cyst, right side: Secondary | ICD-10-CM | POA: Insufficient documentation

## 2023-03-02 DIAGNOSIS — N8301 Follicular cyst of right ovary: Secondary | ICD-10-CM | POA: Diagnosis not present

## 2023-03-02 DIAGNOSIS — N858 Other specified noninflammatory disorders of uterus: Secondary | ICD-10-CM | POA: Diagnosis not present

## 2023-03-02 HISTORY — PX: ROBOTIC ASSISTED TOTAL HYSTERECTOMY WITH BILATERAL SALPINGO OOPHERECTOMY: SHX6086

## 2023-03-02 LAB — POCT I-STAT, CHEM 8
BUN: 7 mg/dL (ref 6–20)
Calcium, Ion: 0.94 mmol/L — ABNORMAL LOW (ref 1.15–1.40)
Chloride: 111 mmol/L (ref 98–111)
Creatinine, Ser: 1 mg/dL (ref 0.44–1.00)
Glucose, Bld: 133 mg/dL — ABNORMAL HIGH (ref 70–99)
HCT: 23 % — ABNORMAL LOW (ref 36.0–46.0)
Hemoglobin: 7.8 g/dL — ABNORMAL LOW (ref 12.0–15.0)
Potassium: 4.1 mmol/L (ref 3.5–5.1)
Sodium: 139 mmol/L (ref 135–145)
TCO2: 18 mmol/L — ABNORMAL LOW (ref 22–32)

## 2023-03-02 LAB — TYPE AND SCREEN
ABO/RH(D): A POS
ABO/RH(D): A POS
Antibody Screen: NEGATIVE
Antibody Screen: NEGATIVE

## 2023-03-02 SURGERY — HYSTERECTOMY, TOTAL, ROBOT-ASSISTED, LAPAROSCOPIC, WITH BILATERAL SALPINGO-OOPHORECTOMY
Anesthesia: General | Site: Abdomen | Laterality: Bilateral

## 2023-03-02 MED ORDER — PROPOFOL 10 MG/ML IV BOLUS
INTRAVENOUS | Status: DC | PRN
  Administered 2023-03-02: 170 mg via INTRAVENOUS

## 2023-03-02 MED ORDER — DEXAMETHASONE SODIUM PHOSPHATE 10 MG/ML IJ SOLN
INTRAMUSCULAR | Status: DC | PRN
  Administered 2023-03-02: 10 mg via INTRAVENOUS

## 2023-03-02 MED ORDER — LACTATED RINGERS IV SOLN: INTRAVENOUS | Status: DC

## 2023-03-02 MED ORDER — MIDAZOLAM HCL 2 MG/2ML IJ SOLN
INTRAMUSCULAR | Status: AC
  Filled 2023-03-02: qty 2

## 2023-03-02 MED ORDER — HYDROMORPHONE HCL 1 MG/ML IJ SOLN
INTRAMUSCULAR | Status: AC
  Filled 2023-03-02: qty 1

## 2023-03-02 MED ORDER — MIDAZOLAM HCL 2 MG/2ML IJ SOLN
INTRAMUSCULAR | Status: DC | PRN
  Administered 2023-03-02: 2 mg via INTRAVENOUS

## 2023-03-02 MED ORDER — CIPROFLOXACIN HCL 500 MG PO TABS: 500.0000 mg | ORAL_TABLET | Freq: Two times a day (BID) | ORAL | 0 refills | Status: DC

## 2023-03-02 MED ORDER — PHENYLEPHRINE HCL-NACL 20-0.9 MG/250ML-% IV SOLN
INTRAVENOUS | Status: DC | PRN
  Administered 2023-03-02: 40 ug/min via INTRAVENOUS

## 2023-03-02 MED ORDER — KETOROLAC TROMETHAMINE 30 MG/ML IJ SOLN: 30.0000 mg | Freq: Once | INTRAMUSCULAR | Status: AC

## 2023-03-02 MED ORDER — PROPOFOL 500 MG/50ML IV EMUL
INTRAVENOUS | Status: AC
  Filled 2023-03-02: qty 50

## 2023-03-02 MED ORDER — PROPOFOL 500 MG/50ML IV EMUL
INTRAVENOUS | Status: DC | PRN
  Administered 2023-03-02: 40 ug/kg/min via INTRAVENOUS

## 2023-03-02 MED ORDER — OXYCODONE HCL 5 MG/5ML PO SOLN: 5.0000 mg | Freq: Once | ORAL | Status: DC | PRN

## 2023-03-02 MED ORDER — SEVOFLURANE IN SOLN
RESPIRATORY_TRACT | Status: AC
  Filled 2023-03-02: qty 250

## 2023-03-02 MED ORDER — PROPOFOL 500 MG/50ML IV EMUL
INTRAVENOUS | Status: AC
Start: 2023-03-02 — End: ?
  Filled 2023-03-02: qty 50

## 2023-03-02 MED ORDER — PROPOFOL 10 MG/ML IV BOLUS
INTRAVENOUS | Status: AC
  Filled 2023-03-02: qty 20

## 2023-03-02 MED ORDER — DEXAMETHASONE SODIUM PHOSPHATE 10 MG/ML IJ SOLN
INTRAMUSCULAR | Status: AC
  Filled 2023-03-02: qty 1

## 2023-03-02 MED ORDER — PHENYLEPHRINE HCL (PRESSORS) 10 MG/ML IV SOLN
INTRAVENOUS | Status: AC
  Filled 2023-03-02: qty 1

## 2023-03-02 MED ORDER — ONDANSETRON 8 MG PO TBDP: 8.0000 mg | ORAL_TABLET | Freq: Three times a day (TID) | ORAL | 0 refills | Status: DC | PRN

## 2023-03-02 MED ORDER — PHENYLEPHRINE 80 MCG/ML (10ML) SYRINGE FOR IV PUSH (FOR BLOOD PRESSURE SUPPORT)
PREFILLED_SYRINGE | INTRAVENOUS | Status: AC
  Filled 2023-03-02: qty 10

## 2023-03-02 MED ORDER — CEFAZOLIN SODIUM-DEXTROSE 2-4 GM/100ML-% IV SOLN
INTRAVENOUS | Status: AC
  Filled 2023-03-02: qty 100

## 2023-03-02 MED ORDER — LIDOCAINE HCL (CARDIAC) PF 100 MG/5ML IV SOSY
PREFILLED_SYRINGE | INTRAVENOUS | Status: DC | PRN
  Administered 2023-03-02: 100 mg via INTRAVENOUS

## 2023-03-02 MED ORDER — FENTANYL CITRATE (PF) 100 MCG/2ML IJ SOLN
INTRAMUSCULAR | Status: DC | PRN
  Administered 2023-03-02 (×2): 50 ug via INTRAVENOUS
  Administered 2023-03-02: 100 ug via INTRAVENOUS

## 2023-03-02 MED ORDER — OXYCODONE-ACETAMINOPHEN 7.5-325 MG PO TABS: 1.0000 | ORAL_TABLET | Freq: Four times a day (QID) | ORAL | 0 refills | Status: DC | PRN

## 2023-03-02 MED ORDER — DIPHENHYDRAMINE HCL 50 MG/ML IJ SOLN
INTRAMUSCULAR | Status: AC
  Filled 2023-03-02: qty 1

## 2023-03-02 MED ORDER — FENTANYL CITRATE (PF) 100 MCG/2ML IJ SOLN
INTRAMUSCULAR | Status: AC
  Filled 2023-03-02: qty 2

## 2023-03-02 MED ORDER — KETOROLAC TROMETHAMINE 10 MG PO TABS: 10.0000 mg | ORAL_TABLET | Freq: Three times a day (TID) | ORAL | 0 refills | Status: DC | PRN

## 2023-03-02 MED ORDER — ONDANSETRON HCL 4 MG/2ML IJ SOLN
INTRAMUSCULAR | Status: AC
Start: 2023-03-02 — End: ?
  Filled 2023-03-02: qty 2

## 2023-03-02 MED ORDER — DIPHENHYDRAMINE HCL 50 MG/ML IJ SOLN
INTRAMUSCULAR | Status: DC | PRN
  Administered 2023-03-02: 12.5 mg via INTRAVENOUS

## 2023-03-02 MED ORDER — SUGAMMADEX SODIUM 200 MG/2ML IV SOLN
INTRAVENOUS | Status: DC | PRN
  Administered 2023-03-02: 400 mg via INTRAVENOUS

## 2023-03-02 MED ORDER — CHLORHEXIDINE GLUCONATE 0.12 % MT SOLN
OROMUCOSAL | Status: AC
  Administered 2023-03-02: 15 mL via OROMUCOSAL
  Filled 2023-03-02: qty 15

## 2023-03-02 MED ORDER — FENTANYL CITRATE PF 50 MCG/ML IJ SOSY: 25.0000 ug | PREFILLED_SYRINGE | INTRAMUSCULAR | Status: DC | PRN

## 2023-03-02 MED ORDER — ROCURONIUM BROMIDE 10 MG/ML (PF) SYRINGE
PREFILLED_SYRINGE | INTRAVENOUS | Status: AC
  Filled 2023-03-02: qty 10

## 2023-03-02 MED ORDER — PHENYLEPHRINE 80 MCG/ML (10ML) SYRINGE FOR IV PUSH (FOR BLOOD PRESSURE SUPPORT)
PREFILLED_SYRINGE | INTRAVENOUS | Status: DC | PRN
  Administered 2023-03-02: 160 ug via INTRAVENOUS
  Administered 2023-03-02: 80 ug via INTRAVENOUS
  Administered 2023-03-02 (×2): 160 ug via INTRAVENOUS
  Administered 2023-03-02 (×2): 80 ug via INTRAVENOUS
  Administered 2023-03-02: 160 ug via INTRAVENOUS

## 2023-03-02 MED ORDER — ALBUMIN HUMAN 5 % IV SOLN: INTRAVENOUS | Status: DC | PRN

## 2023-03-02 MED ORDER — ROCURONIUM BROMIDE 10 MG/ML (PF) SYRINGE
PREFILLED_SYRINGE | INTRAVENOUS | Status: DC | PRN
  Administered 2023-03-02: 80 mg via INTRAVENOUS
  Administered 2023-03-02: 20 mg via INTRAVENOUS
  Administered 2023-03-02 (×2): 50 mg via INTRAVENOUS

## 2023-03-02 MED ORDER — OXYCODONE HCL 5 MG PO TABS: 5.0000 mg | ORAL_TABLET | Freq: Once | ORAL | Status: DC | PRN

## 2023-03-02 MED ORDER — ORAL CARE MOUTH RINSE: 15.0000 mL | Freq: Once | OROMUCOSAL | Status: AC

## 2023-03-02 MED ORDER — ONDANSETRON HCL 4 MG/2ML IJ SOLN
INTRAMUSCULAR | Status: AC
  Filled 2023-03-02: qty 2

## 2023-03-02 MED ORDER — SODIUM CHLORIDE 0.9 % IV SOLN: INTRAVENOUS | Status: DC | PRN

## 2023-03-02 MED ORDER — POVIDONE-IODINE 10 % EX SWAB
2.0000 | Freq: Once | CUTANEOUS | Status: AC
  Administered 2023-03-02: 2 via TOPICAL

## 2023-03-02 MED ORDER — ONDANSETRON HCL 4 MG/2ML IJ SOLN: 4.0000 mg | Freq: Once | INTRAMUSCULAR | Status: DC | PRN

## 2023-03-02 MED ORDER — SODIUM CHLORIDE 0.9 % IR SOLN
Status: DC | PRN
  Administered 2023-03-02: 3000 mL

## 2023-03-02 MED ORDER — STERILE WATER FOR IRRIGATION IR SOLN
Status: DC | PRN
  Administered 2023-03-02: 1000 mL

## 2023-03-02 MED ORDER — HYDROMORPHONE HCL 1 MG/ML IJ SOLN
INTRAMUSCULAR | Status: DC | PRN
  Administered 2023-03-02 (×2): .5 mg via INTRAVENOUS

## 2023-03-02 MED ORDER — ROCURONIUM BROMIDE 10 MG/ML (PF) SYRINGE
PREFILLED_SYRINGE | INTRAVENOUS | Status: AC
  Filled 2023-03-02: qty 20

## 2023-03-02 MED ORDER — LIDOCAINE HCL (PF) 2 % IJ SOLN
INTRAMUSCULAR | Status: AC
  Filled 2023-03-02: qty 5

## 2023-03-02 MED ORDER — BUPIVACAINE HCL 0.25 % IJ SOLN
INTRAMUSCULAR | Status: DC | PRN
  Administered 2023-03-02: 40 mL

## 2023-03-02 MED ORDER — CHLORHEXIDINE GLUCONATE 0.12 % MT SOLN: 15.0000 mL | Freq: Once | OROMUCOSAL | Status: AC

## 2023-03-02 MED ORDER — CEFAZOLIN SODIUM-DEXTROSE 2-4 GM/100ML-% IV SOLN
2.0000 g | INTRAVENOUS | Status: AC
  Administered 2023-03-02 (×2): 2 g via INTRAVENOUS

## 2023-03-02 MED ORDER — ONDANSETRON HCL 4 MG/2ML IJ SOLN
INTRAMUSCULAR | Status: DC | PRN
  Administered 2023-03-02: 8 mg via INTRAVENOUS

## 2023-03-02 MED ORDER — KETOROLAC TROMETHAMINE 30 MG/ML IJ SOLN
INTRAMUSCULAR | Status: AC
  Administered 2023-03-02: 30 mg via INTRAVENOUS
  Filled 2023-03-02: qty 1

## 2023-03-02 MED ORDER — BUPIVACAINE HCL (PF) 0.25 % IJ SOLN
INTRAMUSCULAR | Status: AC
  Filled 2023-03-02: qty 60

## 2023-03-02 SURGICAL SUPPLY — 57 items
ANTIFOG SOL W/FOAM PAD STRL (MISCELLANEOUS) ×1
BLADE SURG SZ10 CARB STEEL (BLADE) IMPLANT
BLADE SURG SZ11 CARB STEEL (BLADE) ×1 IMPLANT
CAUTERY HOOK MNPLR 1.6 DVNC XI (INSTRUMENTS) ×1 IMPLANT
COVER LIGHT HANDLE STERIS (MISCELLANEOUS) ×2 IMPLANT
COVER MAYO STAND XLG (MISCELLANEOUS) ×1 IMPLANT
DERMABOND ADVANCED .7 DNX12 (GAUZE/BANDAGES/DRESSINGS) ×1 IMPLANT
DEVICE RETRIEVAL ALEXIS 14 (MISCELLANEOUS) IMPLANT
DRAPE ARM DVNC X/XI (DISPOSABLE) ×4 IMPLANT
DRAPE COLUMN DVNC XI (DISPOSABLE) ×1 IMPLANT
DRIVER NDL MEGA SUTCUT DVNCXI (INSTRUMENTS) ×1 IMPLANT
DRIVER NDLE MEGA SUTCUT DVNCXI (INSTRUMENTS) ×1
ELECT REM PT RETURN 9FT ADLT (ELECTROSURGICAL) ×1
ELECTRODE REM PT RTRN 9FT ADLT (ELECTROSURGICAL) ×1 IMPLANT
EXTRT SYSTEM ALEXIS 14CM (MISCELLANEOUS) ×1
FORCEPS PROGRASP DVNC XI (FORCEP) ×1 IMPLANT
GAUZE 4X4 16PLY ~~LOC~~+RFID DBL (SPONGE) ×2 IMPLANT
GLOVE BIO SURGEON STRL SZ7 (GLOVE) IMPLANT
GLOVE BIOGEL PI IND STRL 7.0 (GLOVE) ×4 IMPLANT
GLOVE BIOGEL PI IND STRL 7.5 (GLOVE) IMPLANT
GLOVE BIOGEL PI IND STRL 8 (GLOVE) ×2 IMPLANT
GLOVE ECLIPSE 8.0 STRL XLNG CF (GLOVE) ×3 IMPLANT
GLOVE SURG SS PI 7.5 STRL IVOR (GLOVE) IMPLANT
GOWN STRL REUS W/TWL LRG LVL3 (GOWN DISPOSABLE) ×2 IMPLANT
GOWN STRL REUS W/TWL XL LVL3 (GOWN DISPOSABLE) ×2 IMPLANT
GYRUS RUMI II 4.0CM BLUE (DISPOSABLE) ×1
KIT PINK PAD W/HEAD ARE REST (MISCELLANEOUS) ×1
KIT PINK PAD W/HEAD ARM REST (MISCELLANEOUS) ×1 IMPLANT
KIT TURNOVER CYSTO (KITS) ×1 IMPLANT
MANIFOLD NEPTUNE II (INSTRUMENTS) ×1 IMPLANT
NDL HYPO 21X1.5 SAFETY (NEEDLE) ×1 IMPLANT
NDL INSUFFLATION 14GA 120MM (NEEDLE) ×1 IMPLANT
NEEDLE HYPO 21X1.5 SAFETY (NEEDLE) ×1
NEEDLE INSUFFLATION 14GA 120MM (NEEDLE) ×1
OBTURATOR OPTICAL STND 8 DVNC (TROCAR) ×1
OBTURATOR OPTICALSTD 8 DVNC (TROCAR) ×1 IMPLANT
PACK PERI GYN (CUSTOM PROCEDURE TRAY) ×1 IMPLANT
RUMI II GYRUS 4.0CM BLUE (DISPOSABLE) IMPLANT
SEAL UNIV 5-12 XI (MISCELLANEOUS) ×3 IMPLANT
SEALER VESSEL EXT DVNC XI (MISCELLANEOUS) ×1 IMPLANT
SET BASIN LINEN APH (SET/KITS/TRAYS/PACK) ×1 IMPLANT
SET IRRIG TUBING LAPAROSCOPIC (IRRIGATION / IRRIGATOR) ×1 IMPLANT
SET TUBE DA VINCI INSUFFLATOR (TUBING) IMPLANT
SOLUTION ANTFG W/FOAM PAD STRL (MISCELLANEOUS) ×1 IMPLANT
SPONGE T-LAP 18X18 ~~LOC~~+RFID (SPONGE) ×1 IMPLANT
SUT STRATAFIX 0 PDS+ CT-2 23 (SUTURE) ×1
SUT VICRYL 0 AB UR-6 (SUTURE) ×1 IMPLANT
SUT VICRYL 0 UR6 27IN ABS (SUTURE) IMPLANT
SUT VICRYL AB 3-0 FS1 BRD 27IN (SUTURE) ×1 IMPLANT
SUTURE STRATFX 0 PDS+ CT-2 23 (SUTURE) ×1 IMPLANT
SYR 10ML LL (SYRINGE) ×2 IMPLANT
SYR 50ML LL SCALE MARK (SYRINGE) ×2 IMPLANT
SYR CONTROL 10ML LL (SYRINGE) ×2 IMPLANT
TIP RUMI ORANGE 6.7MMX12CM (TIP) ×1 IMPLANT
TRAY FOLEY SLVR 16FR LF STAT (SET/KITS/TRAYS/PACK) IMPLANT
TROCAR KII 8X100ML NONTHREADED (TROCAR) ×1 IMPLANT
WATER STERILE IRR 500ML POUR (IV SOLUTION) ×1 IMPLANT

## 2023-03-02 NOTE — Anesthesia Preprocedure Evaluation (Signed)
Anesthesia Evaluation  Patient identified by MRN, date of birth, ID band Patient awake    Reviewed: Allergy & Precautions, H&P , NPO status , Patient's Chart, lab work & pertinent test results, reviewed documented beta blocker date and time   Airway Mallampati: II  TM Distance: >3 FB Neck ROM: full    Dental no notable dental hx.    Pulmonary neg pulmonary ROS   Pulmonary exam normal breath sounds clear to auscultation       Cardiovascular Exercise Tolerance: Good hypertension, negative cardio ROS  Rhythm:regular Rate:Normal     Neuro/Psych  Headaches negative neurological ROS  negative psych ROS   GI/Hepatic negative GI ROS, Neg liver ROS,,,  Endo/Other    Class 4 obesity  Renal/GU negative Renal ROS  negative genitourinary   Musculoskeletal   Abdominal   Peds  Hematology negative hematology ROS (+) Blood dyscrasia, anemia   Anesthesia Other Findings   Reproductive/Obstetrics negative OB ROS                             Anesthesia Physical Anesthesia Plan  ASA: 3  Anesthesia Plan: General and General ETT   Post-op Pain Management:    Induction:   PONV Risk Score and Plan: Ondansetron and Scopolamine patch - Pre-op  Airway Management Planned:   Additional Equipment:   Intra-op Plan:   Post-operative Plan:   Informed Consent: I have reviewed the patients History and Physical, chart, labs and discussed the procedure including the risks, benefits and alternatives for the proposed anesthesia with the patient or authorized representative who has indicated his/her understanding and acceptance.     Dental Advisory Given  Plan Discussed with: CRNA  Anesthesia Plan Comments:        Anesthesia Quick Evaluation

## 2023-03-02 NOTE — Anesthesia Procedure Notes (Signed)
Procedure Name: Intubation Date/Time: 03/02/2023 11:02 AM  Performed by: Oletha Cruel, CRNAPre-anesthesia Checklist: Patient identified, Emergency Drugs available, Suction available and Patient being monitored Patient Re-evaluated:Patient Re-evaluated prior to induction Oxygen Delivery Method: Circle system utilized Preoxygenation: Pre-oxygenation with 100% oxygen Induction Type: IV induction Ventilation: Mask ventilation without difficulty Laryngoscope Size: Mac and 4 Grade View: Grade II Tube type: Oral Tube size: 7.5 mm Number of attempts: 1 Airway Equipment and Method: Stylet Placement Confirmation: ETT inserted through vocal cords under direct vision, positive ETCO2, CO2 detector and breath sounds checked- equal and bilateral Secured at: 22 cm Tube secured with: Tape Dental Injury: Teeth and Oropharynx as per pre-operative assessment  Comments: Atraumatic intubation. Lips and teeth remain in preoperative condition.

## 2023-03-02 NOTE — H&P (Signed)
Preoperative History and Physical  Joanne Hampton is a 52 y.o. [id number] with No LMP recorded. admitted for a RA TLH + BSO.  Pt with long stanidng heavy long periods Presented to [facility] ED 12/31/22 having bled heavily for 2 weeks Her hemoglobin was 4.4 I gave her IV permarin, po megestrol and 7.5 mg IM Lupron Her pre op hemoglobin is 10.7 Endometrial biopsy 01/05/23 was benign Sonogram reveals 600 cc uterus with 2 definitive fibroids  PMH:    Past Medical History:  Diagnosis Date   Bronchitis    Menorrhagia     PSH:     Past Surgical History:  Procedure Laterality Date   CESAREAN SECTION  1990   TUBAL LIGATION      POb/GynH:      OB History     Gravida  5   Para  4   Term  4   Preterm      AB  1   Living  4      SAB  1   IAB      Ectopic      Multiple      Live Births  4           SH:   Social History   Tobacco Use   Smoking status: Never    Passive exposure: Never   Smokeless tobacco: Never  Vaping Use   Vaping status: Never Used  Substance Use Topics   Alcohol use: Yes    Comment: occasional   Drug use: No    FH:    Family History  Problem Relation Age of Onset   Heart failure Mother      Allergies: No Known Allergies  Medications:       Current Facility-Administered Medications:    ceFAZolin (ANCEF) 2-4 GM/100ML-% IVPB, , , ,    ceFAZolin (ANCEF) IVPB 2g/100 mL premix, 2 g, Intravenous, On Call to OR, Lazaro Arms, MD   chlorhexidine (PERIDEX) 0.12 % solution 15 mL, 15 mL, Mouth/Throat, Once **OR** Oral care mouth rinse, 15 mL, Mouth Rinse, Once, Kiel, Mosetta Putt, MD   chlorhexidine (PERIDEX) 0.12 % solution, , , ,    ketorolac (TORADOL) 30 MG/ML injection 30 mg, 30 mg, Intravenous, Once, Lazaro Arms, MD   ketorolac (TORADOL) 30 MG/ML injection, , , ,    lactated ringers infusion, , Intravenous, Continuous, Kiel, Mosetta Putt, MD   povidone-iodine 10 % swab 2 Application, 2 Application, Topical, Once, Lazaro Arms,  MD  Review of Systems:   Review of Systems  Constitutional: Negative for fever, chills, weight loss, malaise/fatigue and diaphoresis.  HENT: Negative for hearing loss, ear pain, nosebleeds, congestion, sore throat, neck pain, tinnitus and ear discharge.   Eyes: Negative for blurred vision, double vision, photophobia, pain, discharge and redness.  Respiratory: Negative for cough, hemoptysis, sputum production, shortness of breath, wheezing and stridor.   Cardiovascular: Negative for chest pain, palpitations, orthopnea, claudication, leg swelling and PND.  Gastrointestinal: Positive for abdominal pain. Negative for heartburn, nausea, vomiting, diarrhea, constipation, blood in stool and melena.  Genitourinary: Negative for dysuria, urgency, frequency, hematuria and flank pain.  Musculoskeletal: Negative for myalgias, back pain, joint pain and falls.  Skin: Negative for itching and rash.  Neurological: Negative for dizziness, tingling, tremors, sensory change, speech change, focal weakness, seizures, loss of consciousness, weakness and headaches.  Endo/Heme/Allergies: Negative for environmental allergies and polydipsia. Does not bruise/bleed easily.  Psychiatric/Behavioral: Negative for depression, suicidal ideas, hallucinations, memory loss and substance  abuse. The patient is not nervous/anxious and does not have insomnia.      PHYSICAL EXAM:  There were no vitals taken for this visit.    Vitals reviewed. Constitutional: She is oriented to person, place, and time. She appears well-developed and well-nourished.  HENT:  Head: Normocephalic and atraumatic.  Right Ear: External ear normal.  Left Ear: External ear normal.  Nose: Nose normal.  Mouth/Throat: Oropharynx is clear and moist.  Eyes: Conjunctivae and EOM are normal. Pupils are equal, round, and reactive to light. Right eye exhibits no discharge. Left eye exhibits no discharge. No scleral icterus.  Neck: Normal range of motion.  Neck supple. No tracheal deviation present. No thyromegaly present.  Cardiovascular: Normal rate, regular rhythm, normal heart sounds and intact distal pulses.  Exam reveals no gallop and no friction rub.   No murmur heard. Respiratory: Effort normal and breath sounds normal. No respiratory distress. She has no wheezes. She has no rales. She exhibits no tenderness.  GI: Soft. Bowel sounds are normal. She exhibits no distension and no mass. There is tenderness. There is no rebound and no guarding.  Genitourinary:       Vulva is normal without lesions Vagina is pink moist without discharge Cervix normal in appearance and pap is normal Uterus is 16 weeks size on bimanual Adnexa is negative with normal sized ovaries by sonogram  Musculoskeletal: Normal range of motion. She exhibits no edema and no tenderness.  Neurological: She is alert and oriented to person, place, and time. She has normal reflexes. She displays normal reflexes. No cranial nerve deficit. She exhibits normal muscle tone. Coordination normal.  Skin: Skin is warm and dry. No rash noted. No erythema. No pallor.  Psychiatric: She has a normal mood and affect. Her behavior is normal. Judgment and thought content normal.    Labs: Results for orders placed or performed during the hospital encounter of 02/26/23 (from the past 336 hour(s))  CBC   Collection Time: 02/26/23  3:43 PM  Result Value Ref Range   WBC 6.7 4.0 - 10.5 K/uL   RBC 4.11 3.87 - 5.11 MIL/uL   Hemoglobin 10.7 (L) 12.0 - 15.0 g/dL   HCT 28.4 (L) 13.2 - 44.0 %   MCV 87.3 80.0 - 100.0 fL   MCH 26.0 26.0 - 34.0 pg   MCHC 29.8 (L) 30.0 - 36.0 g/dL   RDW 10.2 (H) 72.5 - 36.6 %   Platelets 396 150 - 400 K/uL   nRBC 0.0 0.0 - 0.2 %  Comprehensive metabolic panel   Collection Time: 02/26/23  3:43 PM  Result Value Ref Range   Sodium 139 135 - 145 mmol/L   Potassium 3.8 3.5 - 5.1 mmol/L   Chloride 108 98 - 111 mmol/L   CO2 22 22 - 32 mmol/L   Glucose, Bld 91 70 - 99  mg/dL   BUN 10 6 - 20 mg/dL   Creatinine, Ser 4.40 0.44 - 1.00 mg/dL   Calcium 9.3 8.9 - 34.7 mg/dL   Total Protein 8.3 (H) 6.5 - 8.1 g/dL   Albumin 3.7 3.5 - 5.0 g/dL   AST 17 15 - 41 U/L   ALT 13 0 - 44 U/L   Alkaline Phosphatase 72 38 - 126 U/L   Total Bilirubin 0.2 <1.2 mg/dL   GFR, Estimated >42 >59 mL/min   Anion gap 9 5 - 15  Rapid HIV screen (HIV 1/2 Ab+Ag)   Collection Time: 02/26/23  3:44 PM  Result Value Ref  Range   HIV-1 P24 Antigen - HIV24 NON REACTIVE NON REACTIVE   HIV 1/2 Antibodies NON REACTIVE NON REACTIVE   Interpretation (HIV Ag Ab)      A non reactive test result means that HIV 1 or HIV 2 antibodies and HIV 1 p24 antigen were not detected in the specimen.  Urinalysis, Routine w reflex microscopic -Urine, Clean Catch   Collection Time: 02/26/23  3:44 PM  Result Value Ref Range   Color, Urine YELLOW YELLOW   APPearance HAZY (A) CLEAR   Specific Gravity, Urine 1.023 1.005 - 1.030   pH 5.0 5.0 - 8.0   Glucose, UA NEGATIVE NEGATIVE mg/dL   Hgb urine dipstick LARGE (A) NEGATIVE   Bilirubin Urine NEGATIVE NEGATIVE   Ketones, ur NEGATIVE NEGATIVE mg/dL   Protein, ur 30 (A) NEGATIVE mg/dL   Nitrite NEGATIVE NEGATIVE   Leukocytes,Ua NEGATIVE NEGATIVE   RBC / HPF 11-20 0 - 5 RBC/hpf   WBC, UA 0-5 0 - 5 WBC/hpf   Bacteria, UA NONE SEEN NONE SEEN   Squamous Epithelial / HPF 0-5 0 - 5 /HPF   Mucus PRESENT   Pregnancy, urine   Collection Time: 02/26/23  3:44 PM  Result Value Ref Range   Preg Test, Ur NEGATIVE NEGATIVE  Type and screen [facility]   Collection Time: 02/26/23  3:44 PM  Result Value Ref Range   ABO/RH(D) A POS    Antibody Screen NEG    Sample Expiration      03/16/2023,2359 Performed at [facility], [address] [place], [place] [address]   Results for orders placed or performed during the hospital encounter of 02/26/23 (from the past 336 hour(s))  Type and screen   Collection Time: 02/26/23 10:53 AM  Result Value Ref Range    ABO/RH(D) A POS    Antibody Screen NEG    Sample Expiration      03/01/2023,2359 Performed at [facility], [address], [place], [place] [address]     EKG: Orders placed or performed during the hospital encounter of 03/02/23   EKG 12-LEAD   EKG 12-LEAD    Imaging Studies: No results found.    Assessment: Menometrorrhagia Fibroid uterus Anemia due to menometrorrhagia(4.4) Endometrial biopsy normal  Plan: RA TLH + BSO  Amaryllis Dyke Page Pucciarelli 03/02/2023 10:04 AM

## 2023-03-02 NOTE — Op Note (Signed)
Preoperative diagnosis: Menometrorrhagia Enlarged fibroid uterus, 600 cc by sonogram, 24 weeks size by exam after anesthesia, more like 1000 cc after morcellating   Postoperative diagnosis: SAA   Procedure: Xi Robotic hysterectomy with removal of both tubes and ovaries  Laparoscopic guided transversus abdominus plane block   Surgeon: Lazaro Arms, MD   Anesthesia: General endotracheal   Findings: The lower uterine segment was densely adherent to the anterior abdominal wall from her previous C sections I am sure Uterus was well above the pelvic brim, in fact both ovaries were cephalad to the ovarian pedicle bilaterally There was a large left lower segment broad ligament fibroid Tubes and ovaries and peritoneum were all normal   Description of operation: Patient was taken to the operating room and placed in the low lithotomy position She was prepped and draped in the usual sterile fashion robotic assisted laparoscopic procedure A Foley catheter was placed The vagina was once again prepped additionally   A RUMI II 12 cm with 4 cervical cup was placed for uterine manipulation and colpotomy delineation   An incision was made above 5 cm above  the umbilicus to accommodate the uterine size, in fact all the incisions were above the umbilical level The fascia was grasped A varies needle was used and placed into the peritoneum with 1 pass A pneumoperitoneum was created to a pressure of 15   An 8 mm port was placed into the peritoneal cavity using a nonbladed trocar easily with 1 pass using the video laparoscope The peritoneal cavity was confirmed   There were no unusual findings    4 additional 8 mm ports were placed 3 of the ports were robotic and 1 is an assist port   One was left lateral and 1 was placed right lateral, both lateral to the rectus anterior muscle These were placed under direct visualization without difficulty into the peritoneal cavity Nonbladed trocars were  used in all instances    The patient was placed in 24 degrees of Trendelenburg   The [employer] robot was then docked to the 3 robotic ports   Instruments used during the robotic hysterectomy: Vessel sealer extender using bipolar energy ProGrasp forceps with no energy Monopolar hook Megacut needle driver 2-0 PDS symmetrical STRATAFIX on a CT 2 needle   I then left the patient and went to the surgical console   The RUMI II  was used throughout the case to apply traction and anterior/posterior displacement of the uterus to facilitate the robotic procedure The ProGrasp was used to put traction on the left adnexa The left ureter was identified and found to be well away from the adnexal vessels The vessel sealer extender using bipolar energy was used and the utero-ovarian ligament was coagulated and then transected I used traction medially and anteriorly and used the vessel sealer to take the broad ligament and round ligament down to the level of the cervical isthmus just lateral to the left uterine vessels The blood supply of the left ovary and tube was taken and the tube and ovary removed The right was left attached to the uterus   I then turned my attention to the right adnexa The pro grasp was used and medial and upward traction was placed The vessel sealer extender using bipolar energy was used to coagulate and ligate the right infundibulopelvic ligament vessels Again the right ureter was well inferior to the vessels I continued use medial and anterior retraction using the ProGrasp and the vessel sealer  with the bipolar energy was used to take the broad ligament and round ligament down to the level of the cervical isthmus and lateral to the uterine vessels   I then placed the monopolar hook in the place of the ProGrasp The vessel sealer extender was used to grasp the peritoneum of the bladder provide traction, of course no energy was used for this I used the monopolar hook with  energy to open of the peritoneal leaf anteriorly and dissect the bladder peritoneum off the lower uterine segment and cervix beyond the level of the vaginal colpotomy cup I then used the monopolar hook to take the peritoneum down where I stopped using the vessel sealer and met in the bladder dissection bilaterally   The vessel sealer extender with monopolar energy was used to coagulate the uterine vessels at the level of the cervical isthmus bilaterally and then just above and just below to manage backbleeding The uterine vessels were transected There was good hemostasis I then stayed medial to this uterine vessel pedicle with the vessel sealer extender and took down the paracervical tissue to allow colpotomy incision using the monopolar hook, preserving the cardinal ligament Again there was good hemostasis bilaterally   I then used the monopolar hook and made a posterior colpotomy incision above the level of insertion of the uterosacral ligaments I used a frowny face then smiley face technique and opened the vagina to approximately 8:00 and 4:00 posteriorly I then used the vessel sealer extender with bipolar energy, coagulating and then transecting the vagina   The monopolar hook was used to make the anterior colpotomy incision as the bladder had been dissected well past the colpotomy ring Cephalad tension was placed on the Vcare handle throughout this portion of the procedure to prevent thermal injury to the bladder and safe distance from the ureters bilaterally Colpotomy incision was made from 10:00 to 2:00 The vessel sealer with bipolar energy was then used to coagulate and transect the vagina, again inside of the cardinal ligament   The uterus was removed through the abdominal as were the tubes and ovaries    A wet lap tape was placed in the vagina to maintain pneumoperitoneum   All pedicles were found to be hemostatic there was very little blood loss I then removed the vessel sealer and  monopolar hook The pro grasp was replaced and the needle driver was also placed along with the suture   The vagina was closed using the 2-0 PDS STRATAFIX symmetrical suture on the CT 2 needle I pay close attention to the vaginal corners bilaterally and incorporated those in the closure 1.5 cm of vagina was operating to the vaginal closure I also fixed the uterosacral ligaments to the vaginal cuff repair shortening the uterosacral ligaments thus providing improved vaginal vault suspension The cardinal ligaments were intact again improving postoperative vaginal support Pubocervical rectovaginal and posterior peritoneum were all included in the vaginal closure There was good result hemostasis   At the end of the procedure all the pedicles were identified and found to be hemostatic Both ureters were identified and undergoing normal peristalsis, they were well out of the way of surgical field throughout   I then got up from the console and went back to the side of the patient after gowning and gloving sterilely  A transversus abdominus plane block was placed using laparoscopic guidance at T10 and T7 bilaterally, 10 cc of 0.25% bupivacaine at each site, 40 cc total of 100 mg of bupivacaine.  Doyle's bubble technique was used.  {ExCITE technique was used to remove the specimen through the extended supraumbilical incision.  Total time of 45 minutes was required for complete tissue extraction. I estimate the uterine volume to be 1000 ccbased on the filling of the kidney basin and beyond     The 45 robotic ports were removed The insufflation ports were used to actively desufflate the peritoneal cavity to a pressure of 0 Ports were then removed   The umbilical fascia was closed with ) vicryl running  The subcutaneous tissue of all 5 incisions was closed using 0 Vicryl All 5 skin incisions were closed using 3-0 vicryl in a subcuticular manner Dermabond was used all 5 incision sites  Each incision  was dressed   The patient tolerated the procedure well She received 2 g of Ancef and 30 mg of Toradol preoperatively   EBL: 750 cc much of it was back bleeding from the uterus itself    Lazaro Arms, MD 03/02/2023 4:26 PM

## 2023-03-02 NOTE — Transfer of Care (Signed)
Immediate Anesthesia Transfer of Care Note  Patient: [facility]  Procedure(s) Performed: XI ROBOTIC ASSISTED TOTAL HYSTERECTOMY WITH BILATERAL SALPINGO OOPHORECTOMY (Bilateral: Abdomen)  Patient Location: PACU  Anesthesia Type:General  Level of Consciousness: awake and drowsy  Airway & Oxygen Therapy: Patient Spontanous Breathing and Patient connected to face mask oxygen  Post-op Assessment: Report given to RN, Post -op Vital signs reviewed and stable, and Patient moving all extremities X 4  Post vital signs: Reviewed and stable  Last Vitals:  Vitals Value Taken Time  BP    Temp 98.9 12/.10/24 1625  Pulse 83 03/02/23 1625  Resp 11 03/02/23 1626  SpO2 96 % 03/02/23 1625  Vitals shown include unfiled device data.  Last Pain:  Vitals:   03/02/23 1003  TempSrc: Oral  PainSc: 0-No pain         Complications: No notable events documented.

## 2023-03-04 LAB — SURGICAL PATHOLOGY

## 2023-03-05 ENCOUNTER — Encounter (HOSPITAL_COMMUNITY): Payer: Self-pay | Admitting: Obstetrics & Gynecology

## 2023-03-07 NOTE — Anesthesia Postprocedure Evaluation (Signed)
Anesthesia Post Note  Patient: [name]  Procedure(s) Performed: XI ROBOTIC ASSISTED TOTAL HYSTERECTOMY WITH BILATERAL SALPINGO OOPHORECTOMY (Bilateral: Abdomen)  Patient location during evaluation: Phase II Anesthesia Type: General Level of consciousness: awake Pain management: pain level controlled Vital Signs Assessment: post-procedure vital signs reviewed and stable Respiratory status: spontaneous breathing and respiratory function stable Cardiovascular status: blood pressure returned to baseline and stable Postop Assessment: no headache and no apparent nausea or vomiting Anesthetic complications: no Comments: Late entry   No notable events documented.   Last Vitals:  Vitals:   03/02/23 1715 03/02/23 1723  BP: 104/62 95/63  Pulse: 86 84  Resp: (!) 32 16  Temp:  36.8 C  SpO2: 98% 100%    Last Pain:  Vitals:   03/03/23 1433  TempSrc:   PainSc: 5                  Windell Norfolk

## 2023-03-11 ENCOUNTER — Ambulatory Visit: Payer: Medicaid Other | Admitting: Obstetrics & Gynecology

## 2023-03-11 ENCOUNTER — Encounter: Payer: Self-pay | Admitting: Obstetrics & Gynecology

## 2023-03-11 VITALS — BP 167/73 | HR 80 | Ht 62.0 in | Wt 257.0 lb

## 2023-03-11 DIAGNOSIS — Z9889 Other specified postprocedural states: Secondary | ICD-10-CM

## 2023-03-11 NOTE — Progress Notes (Signed)
  HPI: Patient returns for routine postoperative follow-up having undergone RA TLH +BSO on 03/02/23 1500cc volume, weight 1090 grams.  The patient's immediate postoperative recovery has been unremarkable. Since hospital discharge the patient reports some constipation.   Current Outpatient Medications: albuterol (VENTOLIN HFA) 108 (90 Base) MCG/ACT inhaler, INHALE 2 PUFFS INTO THE LUNGS EVERY 6 HOURS AS NEEDED FOR WHEEZING OR SHORTNESS OF BREATH, Disp: 6.7 g, Rfl: 0 ciprofloxacin (CIPRO) 500 MG tablet, Take 1 tablet (500 mg total) by mouth 2 (two) times daily., Disp: 14 tablet, Rfl: 0 ondansetron (ZOFRAN-ODT) 8 MG disintegrating tablet, Take 1 tablet (8 mg total) by mouth every 8 (eight) hours as needed for nausea or vomiting., Disp: 8 tablet, Rfl: 0 oxyCODONE-acetaminophen (PERCOCET) 7.5-325 MG tablet, Take 1 tablet by mouth every 6 (six) hours as needed., Disp: 28 tablet, Rfl: 0  No current facility-administered medications for this visit.    Blood pressure (!) 167/73, pulse 80, height 5\' 2"  (1.575 m), weight 257 lb (116.6 kg), last menstrual period 12/17/2022.  Physical Exam: 5 incisions all look good  Diagnostic Tests:   Pathology: benign  Impression + Management plan: [name]   ICD-10-CM   1. Post-operative state: RA TLH + BSO  Z98.890    normal post op recovery         Medications Prescribed this encounter: No orders of the defined types were placed in this encounter.     Follow up: Return in about 5 weeks (around 04/15/2023) for Post Op, with Dr Despina Hidden.     Joanne Arms, MD Attending Physician for the Center for [facility] and [facility] 03/11/2023 11:22 AM

## 2023-03-18 ENCOUNTER — Other Ambulatory Visit: Payer: Self-pay | Admitting: Obstetrics & Gynecology

## 2023-03-18 MED ORDER — OXYCODONE-ACETAMINOPHEN 7.5-325 MG PO TABS: 1.0000 | ORAL_TABLET | Freq: Four times a day (QID) | ORAL | 0 refills | Status: DC | PRN

## 2023-03-18 NOTE — Addendum Note (Signed)
Addended by: Lazaro Arms on: 03/18/2023 03:32 PM   Modules accepted: Orders

## 2023-04-15 ENCOUNTER — Ambulatory Visit: Payer: Medicaid Other | Admitting: Obstetrics & Gynecology

## 2023-04-15 ENCOUNTER — Encounter: Payer: Self-pay | Admitting: Obstetrics & Gynecology

## 2023-04-15 VITALS — BP 149/80 | HR 73 | Ht 62.0 in | Wt 254.0 lb

## 2023-04-15 DIAGNOSIS — Z9889 Other specified postprocedural states: Secondary | ICD-10-CM

## 2023-04-15 DIAGNOSIS — B372 Candidiasis of skin and nail: Secondary | ICD-10-CM

## 2023-04-15 MED ORDER — FLUCONAZOLE 100 MG PO TABS: 100.0000 mg | ORAL_TABLET | Freq: Every day | ORAL | 0 refills | Status: AC

## 2023-04-15 MED ORDER — NYSTATIN 100000 UNIT/GM EX POWD: 1.0000 | Freq: Three times a day (TID) | CUTANEOUS | 11 refills | Status: AC

## 2023-04-15 NOTE — Progress Notes (Signed)
  HPI: Patient returns for routine postoperative follow-up having undergone RA TLH BSO on 03/02/2023.  The patient's immediate postoperative recovery has been unremarkable. Since hospital discharge the patient reports no problems.   Current Outpatient Medications: albuterol (VENTOLIN HFA) 108 (90 Base) MCG/ACT inhaler, INHALE 2 PUFFS INTO THE LUNGS EVERY 6 HOURS AS NEEDED FOR WHEEZING OR SHORTNESS OF BREATH (Patient not taking: Reported on 04/15/2023), Disp: 6.7 g, Rfl: 0  No current facility-administered medications for this visit.    Blood pressure (!) 149/80, pulse 73, height 5\' 2"  (1.575 m), weight 254 lb (115.2 kg), last menstrual period 12/17/2022.  Physical Exam: All incisions healing well +Intertriginous yeast of both thigh No vaginal yeast Cuff is intact healing well  Diagnostic Tests:   Pathology: benign  Impression + Management plan:   ICD-10-CM   1. Post-operative state: RA TLH + BSO  Z98.890     2. Candidiasis, intertriginous: GV placed, Diflucan for 1 weeks  B37.2      No sex until after 05/31/23  Meds ordered this encounter  Medications   fluconazole (DIFLUCAN) 100 MG tablet    Sig: Take 1 tablet (100 mg total) by mouth daily.    Dispense:  7 tablet    Refill:  0   nystatin (MYCOSTATIN/NYSTOP) powder    Sig: Apply 1 Application topically 3 (three) times daily.    Dispense:  15 g    Refill:  11        Medications Prescribed this encounter: No orders of the defined types were placed in this encounter.     Follow up: prn   Lazaro Arms, MD Attending Physician for the Center for [facility] and [facility] 04/15/2023 10:42 AM

## 2023-08-13 DIAGNOSIS — G4733 Obstructive sleep apnea (adult) (pediatric): Secondary | ICD-10-CM | POA: Diagnosis not present

## 2023-08-31 ENCOUNTER — Encounter: Payer: Self-pay | Admitting: *Deleted
